# Patient Record
Sex: Male | Born: 1950
Health system: Southern US, Community
[De-identification: ages and names within clinical notes are randomized; demographics above are authoritative.]

## PROBLEM LIST (undated history)

## (undated) DIAGNOSIS — I10 Essential (primary) hypertension: Secondary | ICD-10-CM

## (undated) DIAGNOSIS — E039 Hypothyroidism, unspecified: Secondary | ICD-10-CM

## (undated) DIAGNOSIS — E785 Hyperlipidemia, unspecified: Secondary | ICD-10-CM

## (undated) DIAGNOSIS — Z87891 Personal history of nicotine dependence: Secondary | ICD-10-CM

## (undated) DIAGNOSIS — N200 Calculus of kidney: Secondary | ICD-10-CM

## (undated) DIAGNOSIS — T8859XA Other complications of anesthesia, initial encounter: Secondary | ICD-10-CM

## (undated) DIAGNOSIS — R7303 Prediabetes: Secondary | ICD-10-CM

## (undated) DIAGNOSIS — Z8489 Family history of other specified conditions: Secondary | ICD-10-CM

## (undated) DIAGNOSIS — T4145XA Adverse effect of unspecified anesthetic, initial encounter: Secondary | ICD-10-CM

## (undated) DIAGNOSIS — K219 Gastro-esophageal reflux disease without esophagitis: Secondary | ICD-10-CM

## (undated) DIAGNOSIS — G4730 Sleep apnea, unspecified: Secondary | ICD-10-CM

## (undated) DIAGNOSIS — R011 Cardiac murmur, unspecified: Secondary | ICD-10-CM

## (undated) DIAGNOSIS — M1990 Unspecified osteoarthritis, unspecified site: Secondary | ICD-10-CM

## (undated) HISTORY — PX: JOINT REPLACEMENT: SHX530

## (undated) HISTORY — DX: Personal history of nicotine dependence: Z87.891

## (undated) HISTORY — DX: Hyperlipidemia, unspecified: E78.5

## (undated) HISTORY — PX: HERNIA REPAIR: SHX51

## (undated) HISTORY — DX: Essential (primary) hypertension: I10

## (undated) HISTORY — DX: Prediabetes: R73.03

## (undated) HISTORY — DX: Morbid (severe) obesity due to excess calories: E66.01

## (undated) HISTORY — DX: Hypothyroidism, unspecified: E03.9

## (undated) HISTORY — DX: Cardiac murmur, unspecified: R01.1

---

## 1971-11-29 HISTORY — PX: TOTAL KNEE ARTHROPLASTY: SHX125

## 2014-06-09 DIAGNOSIS — E663 Overweight: Secondary | ICD-10-CM | POA: Diagnosis not present

## 2014-06-09 DIAGNOSIS — Z125 Encounter for screening for malignant neoplasm of prostate: Secondary | ICD-10-CM | POA: Diagnosis not present

## 2014-06-09 DIAGNOSIS — I10 Essential (primary) hypertension: Secondary | ICD-10-CM | POA: Diagnosis not present

## 2014-06-18 DIAGNOSIS — I10 Essential (primary) hypertension: Secondary | ICD-10-CM | POA: Diagnosis not present

## 2014-06-18 DIAGNOSIS — E039 Hypothyroidism, unspecified: Secondary | ICD-10-CM | POA: Diagnosis not present

## 2014-06-18 DIAGNOSIS — E7800 Pure hypercholesterolemia, unspecified: Secondary | ICD-10-CM | POA: Diagnosis not present

## 2014-07-07 DIAGNOSIS — H60509 Unspecified acute noninfective otitis externa, unspecified ear: Secondary | ICD-10-CM | POA: Diagnosis not present

## 2014-07-07 DIAGNOSIS — H6120 Impacted cerumen, unspecified ear: Secondary | ICD-10-CM | POA: Diagnosis not present

## 2014-07-08 DIAGNOSIS — E039 Hypothyroidism, unspecified: Secondary | ICD-10-CM | POA: Diagnosis not present

## 2014-07-08 DIAGNOSIS — E781 Pure hyperglyceridemia: Secondary | ICD-10-CM | POA: Diagnosis not present

## 2014-08-21 DIAGNOSIS — E781 Pure hyperglyceridemia: Secondary | ICD-10-CM | POA: Diagnosis not present

## 2014-08-21 DIAGNOSIS — E039 Hypothyroidism, unspecified: Secondary | ICD-10-CM | POA: Diagnosis not present

## 2014-08-21 DIAGNOSIS — I10 Essential (primary) hypertension: Secondary | ICD-10-CM | POA: Diagnosis not present

## 2014-09-02 DIAGNOSIS — E039 Hypothyroidism, unspecified: Secondary | ICD-10-CM | POA: Diagnosis not present

## 2014-09-02 DIAGNOSIS — I10 Essential (primary) hypertension: Secondary | ICD-10-CM | POA: Diagnosis not present

## 2014-09-02 DIAGNOSIS — E781 Pure hyperglyceridemia: Secondary | ICD-10-CM | POA: Diagnosis not present

## 2014-11-04 ENCOUNTER — Ambulatory Visit: Payer: Self-pay

## 2014-11-04 DIAGNOSIS — R05 Cough: Secondary | ICD-10-CM | POA: Diagnosis not present

## 2014-11-04 DIAGNOSIS — J40 Bronchitis, not specified as acute or chronic: Secondary | ICD-10-CM | POA: Diagnosis not present

## 2014-11-04 DIAGNOSIS — J45909 Unspecified asthma, uncomplicated: Secondary | ICD-10-CM | POA: Diagnosis not present

## 2014-11-04 DIAGNOSIS — J441 Chronic obstructive pulmonary disease with (acute) exacerbation: Secondary | ICD-10-CM | POA: Diagnosis not present

## 2014-11-04 DIAGNOSIS — J9811 Atelectasis: Secondary | ICD-10-CM | POA: Diagnosis not present

## 2015-01-22 ENCOUNTER — Ambulatory Visit: Payer: Self-pay | Admitting: Family Medicine

## 2015-02-04 DIAGNOSIS — R0789 Other chest pain: Secondary | ICD-10-CM | POA: Insufficient documentation

## 2015-05-25 ENCOUNTER — Other Ambulatory Visit: Payer: Self-pay | Admitting: Family Medicine

## 2015-05-25 NOTE — Telephone Encounter (Signed)
Routing to provider  

## 2015-05-25 NOTE — Telephone Encounter (Signed)
Pt called stated he needs a refill on Levothyroxine. 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 Thanks.

## 2015-05-26 MED ORDER — LEVOTHYROXINE SODIUM 75 MCG PO TABS: 75.0000 ug | ORAL_TABLET | Freq: Every day | ORAL | Status: DC

## 2015-06-12 DIAGNOSIS — M170 Bilateral primary osteoarthritis of knee: Secondary | ICD-10-CM | POA: Insufficient documentation

## 2015-06-19 ENCOUNTER — Encounter: Payer: Self-pay | Admitting: Family Medicine

## 2015-06-24 DIAGNOSIS — E039 Hypothyroidism, unspecified: Secondary | ICD-10-CM | POA: Insufficient documentation

## 2015-06-24 DIAGNOSIS — I10 Essential (primary) hypertension: Secondary | ICD-10-CM | POA: Insufficient documentation

## 2015-06-24 DIAGNOSIS — E785 Hyperlipidemia, unspecified: Secondary | ICD-10-CM | POA: Insufficient documentation

## 2015-06-26 ENCOUNTER — Encounter: Payer: Self-pay | Admitting: Family Medicine

## 2015-06-26 ENCOUNTER — Ambulatory Visit (INDEPENDENT_AMBULATORY_CARE_PROVIDER_SITE_OTHER): Payer: Medicare PPO | Admitting: Family Medicine

## 2015-06-26 VITALS — BP 125/83 | HR 67 | Temp 97.5°F | Wt 309.0 lb

## 2015-06-26 DIAGNOSIS — Z23 Encounter for immunization: Secondary | ICD-10-CM | POA: Diagnosis not present

## 2015-06-26 DIAGNOSIS — E039 Hypothyroidism, unspecified: Secondary | ICD-10-CM

## 2015-06-26 DIAGNOSIS — G479 Sleep disorder, unspecified: Secondary | ICD-10-CM

## 2015-06-26 DIAGNOSIS — E785 Hyperlipidemia, unspecified: Secondary | ICD-10-CM | POA: Diagnosis not present

## 2015-06-26 DIAGNOSIS — I10 Essential (primary) hypertension: Secondary | ICD-10-CM | POA: Diagnosis not present

## 2015-06-26 DIAGNOSIS — Z87891 Personal history of nicotine dependence: Secondary | ICD-10-CM

## 2015-06-26 DIAGNOSIS — G4720 Circadian rhythm sleep disorder, unspecified type: Secondary | ICD-10-CM | POA: Insufficient documentation

## 2015-06-26 DIAGNOSIS — Z720 Tobacco use: Secondary | ICD-10-CM | POA: Diagnosis not present

## 2015-06-26 HISTORY — DX: Personal history of nicotine dependence: Z87.891

## 2015-06-26 NOTE — Assessment & Plan Note (Signed)
Check TSH today and adjust if needed 

## 2015-06-26 NOTE — Progress Notes (Signed)
BP 125/83 mmHg  Pulse 67  Temp(Src) 97.5 F (36.4 C)  Wt 309 lb (140.161 kg)  SpO2 97%   Subjective:    Patient ID: Derrick Mckenzie, male    DOB: [birth year], 64 y.o.   MRN: [id number]  HPI: Derrick Mckenzie is a 64 y.o. male  Chief Complaint  Patient presents with  . Hypertension  . Hyperlipidemia  . Hypothyroidism  . IFG   He doesn't sleep well; he does not do anything; he can go on the computer and waste 4-5 hours on the computer Drinks coffee but not within 10 hours of his bedtime; this has gone on for years not sleeping well; he does not snore; wake up during the night; not jerking limbs; first trip to bed, on electronic device in bed; has to urinate   Hypertension; well-controlled; pleased with new lower dose, no dizziness  Hypothyroidism; last TSH was just over 5; energy not great; trouble losing weight  High cholesterol, on fish oil but really constipated; he will switch to krill oil; limiting satured fats; he says he was doing pretty good; was more active, playing golf once a week; under 306 pounds for a while; weighed after golf and got 304 pounds; he'd like to play golf more; he wants to lose weight; high-fiber oatmeal  IFG; avoids sugar and sugary drinks; Crystal Light, water; does have some sugar in his coffee 1 to 1.5 teaspoons sugar  Tobacco abuse; he is not ready to stop; his wife smokes too; patient actually quit for 22 years but then started back  Relevant past medical, surgical, family and social history reviewed and updated as indicated. Interim medical history since our last visit reviewed. Allergies and medications reviewed and updated.  Review of Systems  Per HPI unless specifically indicated above     Objective:    BP 125/83 mmHg  Pulse 67  Temp(Src) 97.5 F (36.4 C)  Wt 309 lb (140.161 kg)  SpO2 97%  Wt Readings from Last 3 Encounters:  06/26/15 309 lb (140.161 kg)  03/23/15 308 lb (139.708 kg)    Physical Exam  Constitutional: He  appears well-developed and well-nourished. No distress.  Morbidly obese  HENT:  Head: Normocephalic and atraumatic.  Eyes: EOM are normal. No scleral icterus.  Neck: No thyromegaly present.  Circumference 18-1/4 inches  Cardiovascular: Normal rate and regular rhythm.   No murmur heard. Pulmonary/Chest: Effort normal and breath sounds normal. No respiratory distress. He has no wheezes.  Abdominal: Soft. Bowel sounds are normal. He exhibits no distension.  Musculoskeletal: He exhibits no edema.  Neurological: Coordination normal.  Skin: Skin is warm and dry. No pallor.  Psychiatric: He has a normal mood and affect. His behavior is normal. Judgment and thought content normal.      Assessment & Plan:   Problem List Items Addressed This Visit      Cardiovascular and Mediastinum   Hypertension    Well-controlled; no dizziness; continue current regimen; getting sleep study to see if part of the problem      Relevant Orders   Pneumococcal polysaccharide vaccine 23-valent greater than or equal to 2yo subcutaneous/IM     Endocrine   Hypothyroidism - Primary    Check TSH today and adjust if needed      Relevant Orders   TSH     Other   Hyperlipidemia    Check lipids today; kriill oil      Relevant Orders   Lipid Panel w/o Chol/HDL Ratio   Morbid  obesity    Check to see if sleep apnea; check TSH; encourage avoidance of empty calories      Relevant Orders   Pneumococcal polysaccharide vaccine 23-valent greater than or equal to 2yo subcutaneous/IM   Sleep pattern disturbance    Neck circumference 18-1/4"; suspect OSA with nocturia and will get sleep study; no electronics two hours before bed; no caffeine 6-10 hours before bed      Relevant Orders   Nocturnal polysomnography (NPSG)   Tobacco abuse    He has quit smoking in the past; 10-15 cigs a day; encouraged cessation;       Relevant Orders   Pneumococcal polysaccharide vaccine 23-valent greater than or equal to 2yo  subcutaneous/IM   Need for prophylactic vaccination against Streptococcus pneumoniae (pneumococcus)    this was recommended to the patient; PCV-13 given today; no more boosters needed; PPSV-23 due in 6-12 months         Follow up plan: Return in about 4 months (around 10/27/2015) for multiple issues.   Orders Placed This Encounter  Procedures  . Pneumococcal polysaccharide vaccine 23-valent greater than or equal to 2yo subcutaneous/IM  . Lipid Panel w/o Chol/HDL Ratio  . TSH  . Nocturnal polysomnography (NPSG)

## 2015-06-26 NOTE — Assessment & Plan Note (Signed)
He has quit smoking in the past; 10-15 cigs a day; encouraged cessation;

## 2015-06-26 NOTE — Patient Instructions (Addendum)
We'll schedule you for a sleep study We'll contact you about the lab results and adjust your thyroid medicine if needed Do try to drink 64 ounces of water a day Limit evening snacking Avoid any and all electronics for two hours before bedtime Return in 4 months but sooner if needed You receive a pneumonia vaccine today called Prevnar, PCV-13; you will not need another booster for the rest of your life for this particular vaccine You can receive the other pneumonia vaccine called Pneumovax next year (PPSV-23)  Smoking Cessation, Tips for Success If you are ready to quit smoking, congratulations! You have chosen to help yourself be healthier. Cigarettes bring nicotine, tar, carbon monoxide, and other irritants into your body. Your lungs, heart, and blood vessels will be able to work better without these poisons. There are many different ways to quit smoking. Nicotine gum, nicotine patches, a nicotine inhaler, or nicotine nasal spray can help with physical craving. Hypnosis, support groups, and medicines help break the habit of smoking. WHAT THINGS CAN I DO TO MAKE QUITTING EASIER?  Here are some tips to help you quit for good:  Pick a date when you will quit smoking completely. Tell all of your friends and family about your plan to quit on that date.  Do not try to slowly cut down on the number of cigarettes you are smoking. Pick a quit date and quit smoking completely starting on that day.  Throw away all cigarettes.   Clean and remove all ashtrays from your home, work, and car.  On a card, write down your reasons for quitting. Carry the card with you and read it when you get the urge to smoke.  Cleanse your body of nicotine. Drink enough water and fluids to keep your urine clear or pale yellow. Do this after quitting to flush the nicotine from your body.  Learn to predict your moods. Do not let a bad situation be your excuse to have a cigarette. Some situations in your life might tempt you  into wanting a cigarette.  Never have "just one" cigarette. It leads to wanting another and another. Remind yourself of your decision to quit.  Change habits associated with smoking. If you smoked while driving or when feeling stressed, try other activities to replace smoking. Stand up when drinking your coffee. Brush your teeth after eating. Sit in a different chair when you read the paper. Avoid alcohol while trying to quit, and try to drink fewer caffeinated beverages. Alcohol and caffeine may urge you to smoke.  Avoid foods and drinks that can trigger a desire to smoke, such as sugary or spicy foods and alcohol.  Ask people who smoke not to smoke around you.  Have something planned to do right after eating or having a cup of coffee. For example, plan to take a walk or exercise.  Try a relaxation exercise to calm you down and decrease your stress. Remember, you may be tense and nervous for the first 2 weeks after you quit, but this will pass.  Find new activities to keep your hands busy. Play with a pen, coin, or rubber band. Doodle or draw things on paper.  Brush your teeth right after eating. This will help cut down on the craving for the taste of tobacco after meals. You can also try mouthwash.   Use oral substitutes in place of cigarettes. Try using lemon drops, carrots, cinnamon sticks, or chewing gum. Keep them handy so they are available when you have the  urge to smoke.  When you have the urge to smoke, try deep breathing.  Designate your home as a nonsmoking area.  If you are a heavy smoker, ask your health care provider about a prescription for nicotine chewing gum. It can ease your withdrawal from nicotine.  Reward yourself. Set aside the cigarette money you save and buy yourself something nice.  Look for support from others. Join a support group or smoking cessation program. Ask someone at home or at work to help you with your plan to quit smoking.  Always ask yourself,  "Do I need this cigarette or is this just a reflex?" Tell yourself, "Today, I choose not to smoke," or "I do not want to smoke." You are reminding yourself of your decision to quit.  Do not replace cigarette smoking with electronic cigarettes (commonly called e-cigarettes). The safety of e-cigarettes is unknown, and some may contain harmful chemicals.  If you relapse, do not give up! Plan ahead and think about what you will do the next time you get the urge to smoke. HOW WILL I FEEL WHEN I QUIT SMOKING? You may have symptoms of withdrawal because your body is used to nicotine (the addictive substance in cigarettes). You may crave cigarettes, be irritable, feel very hungry, cough often, get headaches, or have difficulty concentrating. The withdrawal symptoms are only temporary. They are strongest when you first quit but will go away within 10-14 days. When withdrawal symptoms occur, stay in control. Think about your reasons for quitting. Remind yourself that these are signs that your body is healing and getting used to being without cigarettes. Remember that withdrawal symptoms are easier to treat than the major diseases that smoking can cause.  Even after the withdrawal is over, expect periodic urges to smoke. However, these cravings are generally short lived and will go away whether you smoke or not. Do not smoke! WHAT RESOURCES ARE AVAILABLE TO HELP ME QUIT SMOKING? Your health care provider can direct you to community resources or hospitals for support, which may include:  Group support.  Education.  Hypnosis.  Therapy. Document Released: 08/12/2004 Document Revised: 03/31/2014 Document Reviewed: 05/02/2013 [facility] Patient Information 2015 [place], [place]. This information is not intended to replace advice given to you by your health care provider. Make sure you discuss any questions you have with your health care provider.

## 2015-06-26 NOTE — Assessment & Plan Note (Addendum)
Neck circumference 18-1/4"; suspect OSA with nocturia and will get sleep study; no electronics two hours before bed; no caffeine 6-10 hours before bed

## 2015-06-26 NOTE — Assessment & Plan Note (Signed)
Check lipids today; kriill oil

## 2015-06-26 NOTE — Assessment & Plan Note (Signed)
this was recommended to the patient; PCV-13 given today; no more boosters needed; PPSV-23 due in 6-12 months

## 2015-06-26 NOTE — Assessment & Plan Note (Signed)
Well-controlled; no dizziness; continue current regimen; getting sleep study to see if part of the problem

## 2015-06-26 NOTE — Assessment & Plan Note (Signed)
Check to see if sleep apnea; check TSH; encourage avoidance of empty calories

## 2015-06-27 LAB — LIPID PANEL W/O CHOL/HDL RATIO
Cholesterol, Total: 118 mg/dL (ref 100–199)
HDL: 30 mg/dL — ABNORMAL LOW (ref >39)
LDL Calculated: 51 mg/dL (ref 0–99)
Triglycerides: 185 mg/dL — ABNORMAL HIGH (ref 0–149)
VLDL Cholesterol Cal: 37 mg/dL (ref 5–40)

## 2015-06-27 LAB — TSH: TSH: 4.94 u[IU]/mL — ABNORMAL HIGH (ref 0.450–4.500)

## 2015-06-28 ENCOUNTER — Encounter: Payer: Self-pay | Admitting: Family Medicine

## 2015-06-28 ENCOUNTER — Other Ambulatory Visit: Payer: Self-pay | Admitting: Family Medicine

## 2015-06-28 DIAGNOSIS — E039 Hypothyroidism, unspecified: Secondary | ICD-10-CM

## 2015-06-28 MED ORDER — LEVOTHYROXINE SODIUM 88 MCG PO TABS: 88.0000 ug | ORAL_TABLET | Freq: Every day | ORAL | Status: DC

## 2015-06-28 NOTE — Assessment & Plan Note (Signed)
Increase dose, recheck TSH 8 weeks after adjustment

## 2015-08-28 ENCOUNTER — Other Ambulatory Visit: Payer: Self-pay | Admitting: Family Medicine

## 2015-08-28 NOTE — Telephone Encounter (Signed)
Routing to provider  

## 2015-08-30 NOTE — Telephone Encounter (Signed)
I will ask patient to have TSH rechecked; sending Rx as requested I sent note through [facility]

## 2015-08-31 ENCOUNTER — Ambulatory Visit (INDEPENDENT_AMBULATORY_CARE_PROVIDER_SITE_OTHER): Payer: Medicare PPO | Admitting: Family Medicine

## 2015-08-31 ENCOUNTER — Encounter: Payer: Self-pay | Admitting: Family Medicine

## 2015-08-31 VITALS — BP 123/71 | HR 69 | Temp 97.6°F | Ht 66.7 in | Wt 313.0 lb

## 2015-08-31 DIAGNOSIS — E785 Hyperlipidemia, unspecified: Secondary | ICD-10-CM

## 2015-08-31 DIAGNOSIS — E039 Hypothyroidism, unspecified: Secondary | ICD-10-CM

## 2015-08-31 DIAGNOSIS — Z720 Tobacco use: Secondary | ICD-10-CM

## 2015-08-31 MED ORDER — ATORVASTATIN CALCIUM 20 MG PO TABS: 20.0000 mg | ORAL_TABLET | Freq: Every day | ORAL | Status: DC

## 2015-08-31 MED ORDER — LEVOTHYROXINE SODIUM 100 MCG PO TABS: 100.0000 ug | ORAL_TABLET | Freq: Every day | ORAL | Status: DC

## 2015-08-31 NOTE — Assessment & Plan Note (Signed)
Encouraged him to consider get into [employer]; I'll see if my staff can get additional information about programs for seniors, and I encouraged the patient to call on his own

## 2015-08-31 NOTE — Progress Notes (Signed)
BP 123/71 mmHg  Pulse 69  Temp(Src) 97.6 F (36.4 C)  Ht 5' 6.7" (1.694 m)  Wt 313 lb (141.976 kg)  BMI 49.48 kg/m2  SpO2 96%   Subjective:    Patient ID: Derrick Mckenzie, male    DOB: Jun 27, 1951, 64 y.o.   MRN: [id number]  HPI: Derrick Mckenzie is a 64 y.o. male  Chief Complaint  Patient presents with  . Hypertension  . Hyperlipidemia  . Hypothyroidism   No interim history since last visit  Obesity; just sits around and does nothing; doesn't know what to do; just bored being retired, but does not want to get a job; plays golf once a week; no exercise equipment at home; could go to [employer]; he is thinking about taking some classes; his peak weight has been 351 pounds; he did Atkins diet, able to avoid bread and pasta and potatoes; his current wife cooks for an [employer]; he tried [employer] but it didn't work well; does drink water (bottled or filter)  Hypothyroidism; due for labs to see if correct dose; however, he says that he was taking 88 mcg every day previously when the last lab was checked; I thought he had run out and had not been taking religiously, so I thought today was a recheck TSH on the 88 mcg dose; he reports no palpitations, no chest pain; no diarrhea, no jitteriness  High cholesterol; last lipids were in April; he has been having foot pains with the 40 mg dose of statin, so he has decreased the dose on his own  Relevant past medical, surgical, family and social history reviewed and updated as indicated. Interim medical history since our last visit reviewed. Allergies and medications reviewed and updated.  Review of Systems  Per HPI unless specifically indicated above     Objective:    BP 123/71 mmHg  Pulse 69  Temp(Src) 97.6 F (36.4 C)  Ht 5' 6.7" (1.694 m)  Wt 313 lb (141.976 kg)  BMI 49.48 kg/m2  SpO2 96%  Wt Readings from Last 3 Encounters:  08/31/15 313 lb (141.976 kg)  06/26/15 309 lb (140.161 kg)  03/23/15 308 lb (139.708 kg)     Physical Exam  Constitutional: He appears well-developed and well-nourished.  Weight gain five pounds  Eyes: EOM are normal.  Cardiovascular: Normal rate and regular rhythm.   Pulmonary/Chest: Effort normal and breath sounds normal.  Abdominal: Soft. There is no tenderness.  Obese  Psychiatric: He has a normal mood and affect. His speech is normal and behavior is normal. Thought content normal.   Results for orders placed or performed in visit on 06/26/15  Lipid Panel w/o Chol/HDL Ratio  Result Value Ref Range   Cholesterol, Total 118 100 - 199 mg/dL   Triglycerides 185 (H) 0 - 149 mg/dL   HDL 30 (L) >39 mg/dL   VLDL Cholesterol Cal 37 5 - 40 mg/dL   LDL Calculated 51 0 - 99 mg/dL  TSH  Result Value Ref Range   TSH 4.940 (H) 0.450 - 4.500 uIU/mL      Assessment & Plan:   Problem List Items Addressed This Visit      Endocrine   Hypothyroidism - Primary    Increase dose from 88 to 100 mcg daily; check TSH in 8 weeks      Relevant Medications   levothyroxine (SYNTHROID, LEVOTHROID) 100 MCG tablet     Other   Hyperlipidemia    Decrease statin from 40 mg to 20 mg;  check lipids and sgpt in 8 weeks      Relevant Medications   atorvastatin (LIPITOR) 20 MG tablet   Morbid obesity ([place])    Encouraged him to consider get into Silver [employer]; I'll see if my staff can get additional information about programs for seniors, and I encouraged the patient to call on his own      Tobacco abuse    He is not ready to quit smoking right now          Follow up plan: Return in about 8 weeks (around 10/27/2015).  An after-visit summary was printed and given to the patient at [facility].  Please see the patient instructions which may contain other information and recommendations beyond what is mentioned above in the assessment and plan.  Meds ordered this encounter  Medications  . levothyroxine (SYNTHROID, LEVOTHROID) 100 MCG tablet    Sig: Take 1 tablet (100 mcg total) by mouth  daily.    Dispense:  30 tablet    Refill:  1    Pharmacist -- increasing dose  . atorvastatin (LIPITOR) 20 MG tablet    Sig: Take 1 tablet (20 mg total) by mouth daily.    Dispense:  90 tablet    Refill:  1    Pharmacist -- changing dose from 40 mg to 20 mg

## 2015-08-31 NOTE — Assessment & Plan Note (Signed)
He is not ready to quit smoking right now

## 2015-08-31 NOTE — Assessment & Plan Note (Signed)
Decrease statin from 40 mg to 20 mg; check lipids and sgpt in 8 weeks

## 2015-08-31 NOTE — Assessment & Plan Note (Signed)
Increase dose from 88 to 100 mcg daily; check TSH in 8 weeks

## 2015-08-31 NOTE — Patient Instructions (Addendum)
I am here to help you quit smoking if and when you are ready Decrease the atorvastatin from 40 mg to 20 mg every night Increase the thyroid medicine from 88 to 100 mcg daily Let's recheck your labs in 8 weeks (lab visit only) Limit saturated fats Consider getting back involved in [employer], and check with your insurance to see if any other programs are available

## 2015-10-27 ENCOUNTER — Ambulatory Visit: Payer: Medicare PPO | Admitting: Family Medicine

## 2015-10-29 ENCOUNTER — Other Ambulatory Visit: Payer: Self-pay | Admitting: Family Medicine

## 2015-10-29 DIAGNOSIS — E039 Hypothyroidism, unspecified: Secondary | ICD-10-CM

## 2015-10-29 DIAGNOSIS — E785 Hyperlipidemia, unspecified: Secondary | ICD-10-CM

## 2015-10-29 DIAGNOSIS — Z5181 Encounter for therapeutic drug level monitoring: Secondary | ICD-10-CM

## 2015-10-29 NOTE — Telephone Encounter (Signed)
Routing to provider  

## 2015-10-30 DIAGNOSIS — Z5181 Encounter for therapeutic drug level monitoring: Secondary | ICD-10-CM | POA: Insufficient documentation

## 2015-10-30 NOTE — Telephone Encounter (Signed)
I spoke with patient, they are on their way out of state for a death in the family. He will come in for labs after they get back.

## 2015-10-30 NOTE — Telephone Encounter (Signed)
Patient was supposed to be seen 8 weeks after last visit; he cancelled his appt Please ask patient to have labs done at his earliest convenience I am happy to see him; if he doesn't think he needs to see me, then please just come in for labs Orders entered I'll approve a limited amount of Rx but I don't want him to have a whole month of the wrong dose if we end up adjusting it

## 2015-10-30 NOTE — Assessment & Plan Note (Signed)
Check lipids 

## 2015-10-30 NOTE — Assessment & Plan Note (Signed)
Check TSH; was due 8 weeks after last visit (now overdue)

## 2015-11-04 ENCOUNTER — Telehealth: Payer: Self-pay | Admitting: Family Medicine

## 2015-11-04 NOTE — Telephone Encounter (Signed)
-----   Message from Redgie Grayer sent at 11/04/2015  4:33 PM EST ----- Regarding: RE: Seniors classes, exercise programs, Silver Sneakers Please find the address and phone number below regarding Silver Sneakers.  [facility].  [place], [place] [address]   Phone: [phone]     ----- Message -----    From: Arnetha Courser, MD    Sent: 08/31/2015   8:26 AM      To: Redgie Grayer Subject: Seniors classes, exercise programs, Silver S#  Can you direct me to the right person to ask about silver sneakers and other programs through [facility]? Thanks!!

## 2015-11-06 NOTE — Telephone Encounter (Signed)
Left detailed message with contact info on patients identified voicemail.

## 2015-11-29 HISTORY — PX: REPLACEMENT TOTAL KNEE: SUR1224

## 2015-12-02 ENCOUNTER — Encounter: Payer: Self-pay | Admitting: Family Medicine

## 2015-12-02 MED ORDER — LEVOTHYROXINE SODIUM 50 MCG PO TABS: ORAL_TABLET | ORAL | Status: DC

## 2015-12-02 NOTE — Telephone Encounter (Signed)
Sending in new lower dose of thyroid med; he's been over for a month or so, don't want to jump start him right back to 100 mcg; will start at 50, then 75, then check labs in a few months; offered another statin versus non-statin

## 2015-12-19 ENCOUNTER — Encounter: Payer: Self-pay | Admitting: Family Medicine

## 2015-12-21 ENCOUNTER — Other Ambulatory Visit: Payer: Self-pay

## 2015-12-21 MED ORDER — HYDROCHLOROTHIAZIDE 25 MG PO TABS: 12.5000 mg | ORAL_TABLET | Freq: Every day | ORAL | Status: DC

## 2015-12-21 MED ORDER — AMLODIPINE BESYLATE 10 MG PO TABS: 10.0000 mg | ORAL_TABLET | Freq: Every day | ORAL | Status: DC

## 2015-12-21 MED ORDER — LEVOTHYROXINE SODIUM 50 MCG PO TABS: ORAL_TABLET | ORAL | Status: DC

## 2015-12-21 NOTE — Telephone Encounter (Signed)
Spoke with patient regarding his inability to obtain medication refills through [facility].  Advised patient that I would contact [employer] @ [phone] to find out where the request is being sent and how to rectify this issue going forward.

## 2015-12-21 NOTE — Telephone Encounter (Signed)
Dr. Sanda Klein, it seems as if Derrick Mckenzie has gotten to the root of his medication refill issues below. Could you refill his 3 meds for about 2 weeks to cover until his mail order comes in? Thanks!

## 2015-12-21 NOTE — Telephone Encounter (Signed)
Two week supplies of meds approved to local pharmacy

## 2015-12-21 NOTE — Telephone Encounter (Signed)
LMOM for Derrick Mckenzie advising him that the delay in receiving his RX refills was a [employer] issue and not caused by [employer]. [employer] representative states that the refill request was not sent in properly via their online portal. Rep states that there are issues with their website from time to time and that patients will have a better outcome if they call their order in @ [phone]. Pt's refills will arrive within 7-10 business days. [employer] pharmacist suggested that a two-week supply be called in to a local pharmacy until patient receives their mail order.

## 2016-02-02 ENCOUNTER — Telehealth: Payer: Self-pay | Admitting: Family Medicine

## 2016-02-02 DIAGNOSIS — G4720 Circadian rhythm sleep disorder, unspecified type: Secondary | ICD-10-CM

## 2016-02-02 NOTE — Assessment & Plan Note (Signed)
New order.

## 2016-02-02 NOTE — Telephone Encounter (Signed)
Wife said they were never contacted about sleep study Entered in July 2016; still in chart I will put in referral to neuro high priority

## 2016-02-08 DIAGNOSIS — H5210 Myopia, unspecified eye: Secondary | ICD-10-CM | POA: Diagnosis not present

## 2016-02-08 DIAGNOSIS — H524 Presbyopia: Secondary | ICD-10-CM | POA: Diagnosis not present

## 2016-02-18 ENCOUNTER — Encounter: Payer: Self-pay | Admitting: Family Medicine

## 2016-03-28 ENCOUNTER — Encounter: Payer: Self-pay | Admitting: Family Medicine

## 2016-04-04 ENCOUNTER — Ambulatory Visit: Payer: Medicare PPO | Admitting: Family Medicine

## 2016-04-12 ENCOUNTER — Ambulatory Visit
Admission: RE | Admit: 2016-04-12 | Discharge: 2016-04-12 | Disposition: A | Discharge status: Home / Self Care | Payer: Commercial Managed Care - HMO | Source: Ambulatory Visit | Attending: Family Medicine | Admitting: Family Medicine

## 2016-04-12 ENCOUNTER — Encounter: Payer: Self-pay | Admitting: Family Medicine

## 2016-04-12 ENCOUNTER — Ambulatory Visit (INDEPENDENT_AMBULATORY_CARE_PROVIDER_SITE_OTHER): Payer: Medicare PPO | Admitting: Family Medicine

## 2016-04-12 ENCOUNTER — Telehealth: Payer: Self-pay | Admitting: Family Medicine

## 2016-04-12 VITALS — BP 122/80 | HR 89 | Temp 98.0°F | Resp 16 | Wt 313.0 lb

## 2016-04-12 DIAGNOSIS — Z720 Tobacco use: Secondary | ICD-10-CM | POA: Diagnosis not present

## 2016-04-12 DIAGNOSIS — M179 Osteoarthritis of knee, unspecified: Secondary | ICD-10-CM | POA: Diagnosis not present

## 2016-04-12 DIAGNOSIS — I10 Essential (primary) hypertension: Secondary | ICD-10-CM | POA: Diagnosis not present

## 2016-04-12 DIAGNOSIS — E039 Hypothyroidism, unspecified: Secondary | ICD-10-CM | POA: Diagnosis not present

## 2016-04-12 DIAGNOSIS — M25561 Pain in right knee: Secondary | ICD-10-CM | POA: Diagnosis not present

## 2016-04-12 DIAGNOSIS — M25461 Effusion, right knee: Secondary | ICD-10-CM | POA: Diagnosis not present

## 2016-04-12 DIAGNOSIS — E785 Hyperlipidemia, unspecified: Secondary | ICD-10-CM | POA: Diagnosis not present

## 2016-04-12 DIAGNOSIS — M1711 Unilateral primary osteoarthritis, right knee: Secondary | ICD-10-CM | POA: Diagnosis not present

## 2016-04-12 DIAGNOSIS — Z87891 Personal history of nicotine dependence: Secondary | ICD-10-CM

## 2016-04-12 DIAGNOSIS — M2341 Loose body in knee, right knee: Secondary | ICD-10-CM | POA: Diagnosis not present

## 2016-04-12 DIAGNOSIS — Z5181 Encounter for therapeutic drug level monitoring: Secondary | ICD-10-CM | POA: Diagnosis not present

## 2016-04-12 NOTE — Telephone Encounter (Signed)
Pt said that someone called him at 3:03 and said for you all to call him back

## 2016-04-12 NOTE — Assessment & Plan Note (Addendum)
No success with dieting; patient is open to seeing bariatric surgeon for consultation; referral placed

## 2016-04-12 NOTE — Progress Notes (Signed)
BP 122/80 mmHg  Pulse 89  Temp(Src) 98 F (36.7 C) (Oral)  Resp 16  Wt 313 lb (141.976 kg)  SpO2 96%   Subjective:    Patient ID: Derrick Mckenzie, male    DOB: 12-01-1950, 65 y.o.   MRN: [id number]  HPI: Derrick Mckenzie is a 65 y.o. male  Chief Complaint  Patient presents with  . Follow-up  . Hyperlipidemia    stopped med due to side effects  . Hypothyroidism    stopped med due to side effects  . Knee Pain    right onset 4 weeks   He has been away from medical care for a little while, family and travel have kept him away No medical excitement since last visit QUIT smoking, March 8th quit cold [place]; quit for 22 years once then picked back up  Quit taking his own thyroid medicine; he started to have side effects, he read everything and decided to quit on his own; still bloated and over 300 pounds; nothing changed in terms of his weight He quit taking the cholesterol; he had his heart checked and had carotid checked; he does eat processed meat, but rarely; wife tires to cook healthy, limits starches too; does snack, likes cheese  Right knee pain; going on for 4 weeks; hurt it first 8-9 years ago jumping off of a wall at an underground parking garage, destroyed my leg; went to see ortho; dxd with arthritis; never had a problem before the jump; did cortisone shot, did nothing; never rebounded; then recently, was in [place]; got up from chair awkwardly, pushed and pivoted off the right leg to turn to his left and felt pain; using ibuprofen; no stomach upset or bleeding  Depression screen [facility] 2/9 04/12/2016 06/26/2015  Decreased Interest 0 0  Down, Depressed, Hopeless 0 0  PHQ - 2 Score 0 0   Relevant past medical, surgical, family and social history reviewed Past Medical History  Diagnosis Date  . Hypertension   . Hyperlipidemia   . Morbid obesity ([place])   . Hypothyroidism   . Hx of smoking 06/26/2015   Past Surgical History  Procedure Laterality Date  . Hernia repair    .  Total knee arthroplasty  1973   Family History  Problem Relation Age of Onset  . Cancer Mother     leukemia  . Diabetes Brother    Social History  Substance Use Topics  . Smoking status: Former Smoker -- 0.00 packs/day for 10 years    Types: Cigarettes    Quit date: 02/03/2016  . Smokeless tobacco: Never Used  . Alcohol Use: 0.0 oz/week    0 Standard drinks or equivalent per week     Comment: socially   Interim medical history since last visit reviewed. Allergies and medications reviewed  Review of Systems Per HPI unless specifically indicated above     Objective:    BP 122/80 mmHg  Pulse 89  Temp(Src) 98 F (36.7 C) (Oral)  Resp 16  Wt 313 lb (141.976 kg)  SpO2 96%  Wt Readings from Last 3 Encounters:  04/12/16 313 lb (141.976 kg)  08/31/15 313 lb (141.976 kg)  06/26/15 309 lb (140.161 kg)   body mass index is 49.48 kg/(m^2).  Physical Exam  Constitutional: He appears well-developed and well-nourished.  Weight stable; morbidly obese  Eyes: EOM are normal.  Cardiovascular: Normal rate and regular rhythm.   Heart sounds distant due to body habitus  Pulmonary/Chest: Effort normal and breath sounds normal.  Breath sounds rather distant due to body habitus  Abdominal: Soft. There is no tenderness.  Obese  Musculoskeletal:       Right knee: He exhibits decreased range of motion. He exhibits no deformity and no erythema. Tenderness found. Medial joint line tenderness noted.  Skin: No pallor.  Psychiatric: He has a normal mood and affect. His speech is normal and behavior is normal. Thought content normal.      Assessment & Plan:   Problem List Items Addressed This Visit      Cardiovascular and Mediastinum   Hypertension    Well-controlled today        Endocrine   Hypothyroidism    Check TSH and free T4, this is off of medicine for 6 months, patient quit taking medicines on his own; this likely contributes to his difficulty losing weight      Relevant  Orders   TSH (Completed)   T4, free (Completed)     Other   Hx of smoking    I am ecstatic that patient has quit smoking      Hyperlipidemia - Primary    Limit saturated fats; encouraged more activity, but limited by knee problems; check lipids today      Relevant Orders   Lipid Panel w/o Chol/HDL Ratio (Completed)   Medication monitoring encounter    Check liver, kidneys      Relevant Orders   Comprehensive metabolic panel (Completed)   Morbid obesity ([place])    No success with dieting; patient is open to seeing bariatric surgeon for consultation; referral placed      Relevant Orders   Ambulatory referral to General Surgery   Right medial knee pain    Suspect meniscal injury based on history; will start with plain films, refer to ortho      Relevant Orders   DG Knee Complete 4 Views Right (Completed)      Follow up plan: Return in about 6 months (around 10/13/2016) for follow-up and labs.  An after-visit summary was printed and given to the patient at [place].  Please see the patient instructions which may contain other information and recommendations beyond what is mentioned above in the assessment and plan.  Orders Placed This Encounter  Procedures  . DG Knee Complete 4 Views Right  . Comprehensive metabolic panel  . Lipid Panel w/o Chol/HDL Ratio  . TSH  . T4, free  . Ambulatory referral to General Surgery

## 2016-04-12 NOTE — Assessment & Plan Note (Signed)
Well controlled today.

## 2016-04-12 NOTE — Assessment & Plan Note (Signed)
Check liver, kidneys 

## 2016-04-12 NOTE — Patient Instructions (Addendum)
Try turmeric as a natural anti-inflammatory (for pain and arthritis). It comes in capsules where you buy aspirin and fish oil, but also as a spice where you buy pepper and garlic powder. If you need something for aches or pains, try to use Tylenol (acetaminphen) instead of non-steroidals (which include Aleve, ibuprofen, Advil, Motrin, and naproxen); non-steroidals can cause long-term kidney damage and increase the risk of heart attack and stroke If you do take ibuprofen or naproxen, make sure you take it at least one hour AFTER you have taking your first aspirin of the day Have labs done today here and the xray done across the street If you have not heard anything from my staff in a week about any orders/referrals/studies from today, please contact us here to follow-up [phone]  Check out the information at familydoctor.org: https://familydoctor.org/nutrition-weight-loss-need-know-fad-diets/ Try to lose between 1-2 pounds per week by taking in fewer calories and burning off more calories You can succeed by limiting portions, limiting foods dense in calories and fat, becoming more active, and drinking 8 glasses of water a day (64 ounces) Don't skip meals, especially breakfast, as skipping meals may alter your metabolism Do not use over-the-counter weight loss pills or gimmicks that claim rapid weight loss A healthy BMI (or body mass index) is between 18.5 and 24.9 You can calculate your ideal BMI at the [place] website [employer]Monet[employer]fr

## 2016-04-12 NOTE — Assessment & Plan Note (Addendum)
Check TSH and free T4, this is off of medicine for 6 months, patient quit taking medicines on his own; this likely contributes to his difficulty losing weight

## 2016-04-12 NOTE — Assessment & Plan Note (Signed)
Limit saturated fats; encouraged more activity, but limited by knee problems; check lipids today

## 2016-04-13 ENCOUNTER — Encounter: Payer: Self-pay | Admitting: Family Medicine

## 2016-04-13 ENCOUNTER — Other Ambulatory Visit: Payer: Self-pay | Admitting: Family Medicine

## 2016-04-13 DIAGNOSIS — E781 Pure hyperglyceridemia: Secondary | ICD-10-CM

## 2016-04-13 DIAGNOSIS — E039 Hypothyroidism, unspecified: Secondary | ICD-10-CM

## 2016-04-13 LAB — COMPREHENSIVE METABOLIC PANEL
ALT: 14 IU/L (ref 0–44)
AST: 18 IU/L (ref 0–40)
Albumin/Globulin Ratio: 1.6 (ref 1.2–2.2)
Albumin: 4.6 g/dL (ref 3.6–4.8)
Alkaline Phosphatase: 62 IU/L (ref 39–117)
BUN/Creatinine Ratio: 17 (ref 10–24)
BUN: 21 mg/dL (ref 8–27)
Bilirubin Total: 0.5 mg/dL (ref 0.0–1.2)
CO2: 29 mmol/L (ref 18–29)
Calcium: 9.8 mg/dL (ref 8.6–10.2)
Chloride: 94 mmol/L — ABNORMAL LOW (ref 96–106)
Creatinine, Ser: 1.23 mg/dL (ref 0.76–1.27)
GFR calc Af Amer: 70 mL/min/{1.73_m2} (ref >59)
GFR calc non Af Amer: 60 mL/min/{1.73_m2} (ref >59)
Globulin, Total: 2.9 g/dL (ref 1.5–4.5)
Glucose: 114 mg/dL — ABNORMAL HIGH (ref 65–99)
Potassium: 4.5 mmol/L (ref 3.5–5.2)
Sodium: 140 mmol/L (ref 134–144)
Total Protein: 7.5 g/dL (ref 6.0–8.5)

## 2016-04-13 LAB — T4, FREE: Free T4: 1.02 ng/dL (ref 0.82–1.77)

## 2016-04-13 LAB — LIPID PANEL W/O CHOL/HDL RATIO
Cholesterol, Total: 209 mg/dL — ABNORMAL HIGH (ref 100–199)
HDL: 33 mg/dL — ABNORMAL LOW (ref >39)
LDL Calculated: 101 mg/dL — ABNORMAL HIGH (ref 0–99)
Triglycerides: 376 mg/dL — ABNORMAL HIGH (ref 0–149)
VLDL Cholesterol Cal: 75 mg/dL — ABNORMAL HIGH (ref 5–40)

## 2016-04-13 LAB — TSH: TSH: 7.95 u[IU]/mL — ABNORMAL HIGH (ref 0.450–4.500)

## 2016-04-13 MED ORDER — LEVOTHYROXINE SODIUM 50 MCG PO TABS: ORAL_TABLET | ORAL | Status: DC

## 2016-04-13 NOTE — Assessment & Plan Note (Signed)
Start back on med, titrate up 50 mcg x 1 month, then 75 mcg

## 2016-04-19 ENCOUNTER — Encounter: Payer: Self-pay | Admitting: Family Medicine

## 2016-04-19 DIAGNOSIS — M25561 Pain in right knee: Secondary | ICD-10-CM

## 2016-04-19 MED ORDER — OXYCODONE-ACETAMINOPHEN 5-325 MG PO TABS: 0.5000 | ORAL_TABLET | Freq: Four times a day (QID) | ORAL | Status: DC | PRN

## 2016-04-19 MED ORDER — HYDROCODONE-ACETAMINOPHEN 5-325 MG PO TABS: 1.0000 | ORAL_TABLET | ORAL | Status: DC | PRN

## 2016-04-19 NOTE — Assessment & Plan Note (Signed)
See xrays; refer

## 2016-04-19 NOTE — Addendum Note (Signed)
Addended by: Shiquan Mathieu, Satira Anis on: 04/19/2016 12:58 PM   Modules accepted: Orders, Medications

## 2016-04-28 DIAGNOSIS — M2391 Unspecified internal derangement of right knee: Secondary | ICD-10-CM | POA: Diagnosis not present

## 2016-04-28 DIAGNOSIS — M1711 Unilateral primary osteoarthritis, right knee: Secondary | ICD-10-CM | POA: Diagnosis not present

## 2016-04-29 ENCOUNTER — Encounter: Payer: Self-pay | Admitting: Family Medicine

## 2016-04-29 NOTE — Telephone Encounter (Signed)
Miel, can you please contact the patient and see how we can best resolve this issue? Thank you

## 2016-05-02 ENCOUNTER — Other Ambulatory Visit: Payer: Self-pay | Admitting: Orthopedic Surgery

## 2016-05-02 DIAGNOSIS — M1711 Unilateral primary osteoarthritis, right knee: Secondary | ICD-10-CM

## 2016-05-02 DIAGNOSIS — M2391 Unspecified internal derangement of right knee: Secondary | ICD-10-CM

## 2016-05-03 NOTE — Telephone Encounter (Signed)
I personally called the patient; he says it has been straightened out; he called [facility] because he said that is who he needed to talk to, not really me He wants the knee fixed, not a remedy for just pain; he doesn't want to take more pills He takes ibuprofen PRN, it's holding him for now; really has gotten a lot better; can maneuver, walk He told him he has bone on bone, spurs He is going to schedule him for an MRI later this month Patient wants the next step, wants to fix the knee He'll refer him to Dr. Jefm Bryant or Dr. Roland Rack if needed

## 2016-05-10 ENCOUNTER — Encounter: Payer: Self-pay | Admitting: Family Medicine

## 2016-05-10 NOTE — Assessment & Plan Note (Signed)
I am ecstatic that patient has quit smoking

## 2016-05-10 NOTE — Assessment & Plan Note (Signed)
Suspect meniscal injury based on history; will start with plain films, refer to ortho

## 2016-05-20 ENCOUNTER — Ambulatory Visit
Admission: RE | Admit: 2016-05-20 | Discharge: 2016-05-20 | Disposition: A | Discharge status: Home / Self Care | Payer: Commercial Managed Care - HMO | Source: Ambulatory Visit | Attending: Orthopedic Surgery | Admitting: Orthopedic Surgery

## 2016-05-20 DIAGNOSIS — X58XXXA Exposure to other specified factors, initial encounter: Secondary | ICD-10-CM | POA: Diagnosis not present

## 2016-05-20 DIAGNOSIS — M2391 Unspecified internal derangement of right knee: Secondary | ICD-10-CM | POA: Diagnosis not present

## 2016-05-20 DIAGNOSIS — S83241A Other tear of medial meniscus, current injury, right knee, initial encounter: Secondary | ICD-10-CM | POA: Diagnosis not present

## 2016-05-20 DIAGNOSIS — S83511A Sprain of anterior cruciate ligament of right knee, initial encounter: Secondary | ICD-10-CM | POA: Diagnosis not present

## 2016-05-20 DIAGNOSIS — M23321 Other meniscus derangements, posterior horn of medial meniscus, right knee: Secondary | ICD-10-CM | POA: Diagnosis not present

## 2016-05-20 DIAGNOSIS — M1711 Unilateral primary osteoarthritis, right knee: Secondary | ICD-10-CM | POA: Insufficient documentation

## 2016-05-25 DIAGNOSIS — M1711 Unilateral primary osteoarthritis, right knee: Secondary | ICD-10-CM | POA: Diagnosis not present

## 2016-05-26 ENCOUNTER — Encounter: Payer: Self-pay | Admitting: Family Medicine

## 2016-06-01 ENCOUNTER — Encounter: Payer: Self-pay | Admitting: Family Medicine

## 2016-06-01 DIAGNOSIS — M25561 Pain in right knee: Secondary | ICD-10-CM

## 2016-06-02 NOTE — Telephone Encounter (Signed)
Referral to Dr. Marry Guan

## 2016-06-02 NOTE — Assessment & Plan Note (Signed)
Refer to Dr. Marry Guan per pt request

## 2016-06-06 ENCOUNTER — Encounter: Payer: Self-pay | Admitting: Family Medicine

## 2016-06-06 NOTE — Telephone Encounter (Signed)
Derrick Mckenzie, please see patient's request; thank you

## 2016-06-07 ENCOUNTER — Encounter: Payer: Self-pay | Admitting: Family Medicine

## 2016-06-09 ENCOUNTER — Encounter: Payer: Self-pay | Admitting: Family Medicine

## 2016-06-10 MED ORDER — AZELASTINE HCL 0.1 % NA SOLN: 1.0000 | Freq: Two times a day (BID) | NASAL | Status: DC

## 2016-07-05 DIAGNOSIS — M1711 Unilateral primary osteoarthritis, right knee: Secondary | ICD-10-CM | POA: Diagnosis not present

## 2016-07-06 ENCOUNTER — Encounter: Payer: Self-pay | Admitting: Family Medicine

## 2016-07-06 ENCOUNTER — Ambulatory Visit (INDEPENDENT_AMBULATORY_CARE_PROVIDER_SITE_OTHER): Payer: Medicare PPO | Admitting: Family Medicine

## 2016-07-06 VITALS — BP 122/80 | HR 85 | Temp 98.3°F | Resp 14 | Wt 317.0 lb

## 2016-07-06 DIAGNOSIS — I10 Essential (primary) hypertension: Secondary | ICD-10-CM | POA: Diagnosis not present

## 2016-07-06 DIAGNOSIS — Z01810 Encounter for preprocedural cardiovascular examination: Secondary | ICD-10-CM | POA: Diagnosis not present

## 2016-07-06 DIAGNOSIS — Z5181 Encounter for therapeutic drug level monitoring: Secondary | ICD-10-CM | POA: Diagnosis not present

## 2016-07-06 DIAGNOSIS — Z87891 Personal history of nicotine dependence: Secondary | ICD-10-CM

## 2016-07-06 DIAGNOSIS — E785 Hyperlipidemia, unspecified: Secondary | ICD-10-CM

## 2016-07-06 DIAGNOSIS — E039 Hypothyroidism, unspecified: Secondary | ICD-10-CM

## 2016-07-06 DIAGNOSIS — Z720 Tobacco use: Secondary | ICD-10-CM

## 2016-07-06 DIAGNOSIS — M25561 Pain in right knee: Secondary | ICD-10-CM

## 2016-07-06 NOTE — Assessment & Plan Note (Signed)
Check cmp 

## 2016-07-06 NOTE — Patient Instructions (Addendum)
Try to limit saturated fats in your diet (bologna, hot dogs, barbeque, cheeseburgers, hamburgers, steak, bacon, sausage, cheese, etc.) and get more fresh fruits, vegetables, and whole grains  Your goal blood pressure is less than 150 mmHg on top. Try to follow the DASH guidelines (DASH stands for Dietary Approaches to Stop Hypertension) Try to limit the sodium in your diet.  Ideally, consume less than 1.5 grams (less than 1,500mg ) per day. Do not add salt when cooking or at the table.  Check the sodium amount on labels when shopping, and choose items lower in sodium when given a choice. Avoid or limit foods that already contain a lot of sodium. Eat a diet rich in fruits and vegetables and whole grains.  Please do see Dr. Ubaldo Glassing to get cardiac clearance  Check out the information at familydoctor.org entitled "Nutrition for Weight Loss: What You Need to Know about Fad Diets" Try to lose between 1-2 pounds per week by taking in fewer calories and burning off more calories You can succeed by limiting portions, limiting foods dense in calories and fat, becoming more active, and drinking 8 glasses of water a day (64 ounces) Don't skip meals, especially breakfast, as skipping meals may alter your metabolism Do not use over-the-counter weight loss pills or gimmicks that claim rapid weight loss A healthy BMI (or body mass index) is between 18.5 and 24.9 You can calculate your ideal BMI at the [place] website [employer]fr

## 2016-07-06 NOTE — Assessment & Plan Note (Signed)
I am so proud of him for quitting

## 2016-07-06 NOTE — Progress Notes (Signed)
BP 122/80   Pulse 85   Temp 98.3 F (36.8 C) (Oral)   Resp 14   Wt (!) 317 lb (143.8 kg)   SpO2 93%   BMI 50.10 kg/m    Subjective:    Patient ID: Derrick Mckenzie, male    DOB: 01-08-1951, 65 y.o.   MRN: [id number]  HPI: Derrick Mckenzie is a 66 y.o. male  Chief Complaint  Patient presents with  . surgical clearance    right total knee replacement   He is going to have his right knee replaced; standing causes significant pain in the right knee 7 out of 10 usually; standing makes it worst He went to see ortho and they are going to do replacement MRI and then saw Dr. Jefm Bryant; he went there yesterday  MRI May 20, 2016 Popliteal Fossa: Moderate Baker cyst with multiple loose bodies within the Baker cyst.  IMPRESSION: 1. Tricompartmental cartilage abnormalities most severe in the medial femorotibial compartment. 2. Radial tear of the posterior horn of the medial meniscus with peripheral meniscal extrusion. 3. Maceration of the anterior horn of the lateral meniscus. Partial radial tear of the posterior horn of the lateral meniscus. 4. Chronic ACL tear.  Electronically Signed   By: Kathreen Devoid   On: 05/20/2016 11:02 -------------------------------------  He sees a cardiologist, Dr. Ubaldo Glassing and we'll get cardiac clearance from him; last stress test was 2015 or so  No skin infections; no hx of MRSA; has a spot on his right leg, has been there for years and has not changed and another doctor said it is not skin cancer and it's not growing No chronic cough, no sputum; no SHOB No chest pain No claudication No easy bleeding BP well-controlled  Depression screen [facility] 2/9 07/06/2016 04/12/2016 06/26/2015  Decreased Interest 0 0 0  Down, Depressed, Hopeless 0 0 0  PHQ - 2 Score 0 0 0   Relevant past medical, surgical, family and social history reviewed Past Medical History:  Diagnosis Date  . Hx of smoking 06/26/2015  . Hyperlipidemia   . Hypertension   . Hypothyroidism     . Morbid obesity ([place])    Past Surgical History:  Procedure Laterality Date  . HERNIA REPAIR    . TOTAL KNEE ARTHROPLASTY  1973   Family History  Problem Relation Age of Onset  . Cancer Mother     leukemia  . Diabetes Brother    Social History  Substance Use Topics  . Smoking status: Former Smoker    Packs/day: 0.00    Years: 10.00    Types: Cigarettes    Quit date: 02/03/2016  . Smokeless tobacco: Never Used  . Alcohol use 0.0 oz/week     Comment: socially   Interim medical history since last visit reviewed. Allergies and medications reviewed  Review of Systems Per HPI unless specifically indicated above     Objective:    BP 122/80   Pulse 85   Temp 98.3 F (36.8 C) (Oral)   Resp 14   Wt (!) 317 lb (143.8 kg)   SpO2 93%   BMI 50.10 kg/m   Wt Readings from Last 3 Encounters:  07/06/16 (!) 317 lb (143.8 kg)  04/12/16 (!) 313 lb (142 kg)  08/31/15 (!) 313 lb (142 kg)    Physical Exam  Constitutional: He appears well-developed and well-nourished. No distress.  HENT:  Head: Normocephalic and atraumatic.  Eyes: EOM are normal. No scleral icterus.  Neck: No thyromegaly present.  Cardiovascular: Normal  rate and regular rhythm.   Pulmonary/Chest: Effort normal and breath sounds normal.  Abdominal: Soft. Bowel sounds are normal. He exhibits no distension.  Musculoskeletal: He exhibits no edema.  Neurological: He is alert.  Skin: Skin is warm and dry. No pallor.  Psychiatric: He has a normal mood and affect. His behavior is normal. Judgment and thought content normal.      Assessment & Plan:   Problem List Items Addressed This Visit      Cardiovascular and Mediastinum   Hypertension    Well-controlled today; work on weight loss and hopefully we can start to decrease medicines in the future; DASH guidelines        Endocrine   Hypothyroidism    Check TSH      Relevant Orders   TSH (Completed)     Other   Right medial knee pain    Expecting knee  replacement soon; I will give medical clearance after labs are back; cardiac clearance to come from heart doctor      Morbid obesity ([place])    On hold with bariatric surgery until knee is completed      Medication monitoring encounter    Check cmp      Hyperlipidemia    Try to limit saturated fats; krill oil; check fasting labs another day      Relevant Orders   Lipid panel (Completed)   Hx of smoking    I am so proud of him for quitting      Encounter for pre-operative cardiovascular clearance - Primary    Refer to Dr. Ubaldo Glassing for pre-op clearance      Relevant Orders   CBC with Differential/Platelet (Completed)   Comprehensive Metabolic Panel (CMET) (Completed)   Ambulatory referral to Cardiology    Other Visit Diagnoses   None.     Follow up plan: No Follow-up on file.  An after-visit summary was printed and given to the patient at [facility].  Please see the patient instructions which may contain other information and recommendations beyond what is mentioned above in the assessment and plan.  No orders of the defined types were placed in this encounter.   Orders Placed This Encounter  Procedures  . CBC with Differential/Platelet  . Comprehensive Metabolic Panel (CMET)  . Lipid panel  . TSH  . Ambulatory referral to Cardiology

## 2016-07-06 NOTE — Assessment & Plan Note (Signed)
Well-controlled today; work on weight loss and hopefully we can start to decrease medicines in the future; DASH guidelines

## 2016-07-06 NOTE — Assessment & Plan Note (Signed)
Check TSH 

## 2016-07-06 NOTE — Assessment & Plan Note (Signed)
Refer to Dr. Ubaldo Glassing for pre-op clearance

## 2016-07-06 NOTE — Assessment & Plan Note (Signed)
On hold with bariatric surgery until knee is completed

## 2016-07-06 NOTE — Assessment & Plan Note (Signed)
Expecting knee replacement soon; I will give medical clearance after labs are back; cardiac clearance to come from heart doctor

## 2016-07-06 NOTE — Assessment & Plan Note (Signed)
Try to limit saturated fats; krill oil; check fasting labs another day

## 2016-07-07 ENCOUNTER — Other Ambulatory Visit: Payer: Self-pay

## 2016-07-07 DIAGNOSIS — E039 Hypothyroidism, unspecified: Secondary | ICD-10-CM

## 2016-07-07 DIAGNOSIS — E785 Hyperlipidemia, unspecified: Secondary | ICD-10-CM

## 2016-07-07 DIAGNOSIS — Z01810 Encounter for preprocedural cardiovascular examination: Secondary | ICD-10-CM

## 2016-07-07 LAB — CBC WITH DIFFERENTIAL/PLATELET
Basophils Absolute: 78 cells/uL (ref 0–200)
Basophils Relative: 1 %
Eosinophils Absolute: 234 cells/uL (ref 15–500)
Eosinophils Relative: 3 %
HCT: 43.3 % (ref 38.5–50.0)
Hemoglobin: 15 g/dL (ref 13.2–17.1)
Lymphocytes Relative: 19 %
Lymphs Abs: 1482 cells/uL (ref 850–3900)
MCH: 30.9 pg (ref 27.0–33.0)
MCHC: 34.6 g/dL (ref 32.0–36.0)
MCV: 89.1 fL (ref 80.0–100.0)
MPV: 9.8 fL (ref 7.5–12.5)
Monocytes Absolute: 546 cells/uL (ref 200–950)
Monocytes Relative: 7 %
Neutro Abs: 5460 cells/uL (ref 1500–7800)
Neutrophils Relative %: 70 %
Platelets: 234 10*3/uL (ref 140–400)
RBC: 4.86 MIL/uL (ref 4.20–5.80)
RDW: 14.4 % (ref 11.0–15.0)
WBC: 7.8 10*3/uL (ref 3.8–10.8)

## 2016-07-07 LAB — TSH: TSH: 9.95 mIU/L — ABNORMAL HIGH (ref 0.40–4.50)

## 2016-07-07 LAB — COMPREHENSIVE METABOLIC PANEL
ALT: 16 U/L (ref 9–46)
AST: 16 U/L (ref 10–35)
Albumin: 4.4 g/dL (ref 3.6–5.1)
Alkaline Phosphatase: 55 U/L (ref 40–115)
BUN: 20 mg/dL (ref 7–25)
CO2: 30 mmol/L (ref 20–31)
Calcium: 9.6 mg/dL (ref 8.6–10.3)
Chloride: 101 mmol/L (ref 98–110)
Creat: 1.16 mg/dL (ref 0.70–1.25)
Glucose, Bld: 140 mg/dL — ABNORMAL HIGH (ref 65–99)
Potassium: 4.1 mmol/L (ref 3.5–5.3)
Sodium: 138 mmol/L (ref 135–146)
Total Bilirubin: 0.6 mg/dL (ref 0.2–1.2)
Total Protein: 7.1 g/dL (ref 6.1–8.1)

## 2016-07-07 LAB — LIPID PANEL
Cholesterol: 198 mg/dL (ref 125–200)
HDL: 35 mg/dL — ABNORMAL LOW (ref >=40)
LDL Cholesterol: 98 mg/dL (ref <130)
Total CHOL/HDL Ratio: 5.7 Ratio — ABNORMAL HIGH (ref <=5.0)
Triglycerides: 323 mg/dL — ABNORMAL HIGH (ref <150)
VLDL: 65 mg/dL — ABNORMAL HIGH (ref <30)

## 2016-07-08 ENCOUNTER — Encounter: Payer: Self-pay | Admitting: Family Medicine

## 2016-07-08 ENCOUNTER — Other Ambulatory Visit: Payer: Medicare PPO

## 2016-07-12 ENCOUNTER — Encounter: Payer: Self-pay | Admitting: Family Medicine

## 2016-07-22 ENCOUNTER — Other Ambulatory Visit: Payer: Self-pay | Admitting: Family Medicine

## 2016-07-22 DIAGNOSIS — R739 Hyperglycemia, unspecified: Secondary | ICD-10-CM | POA: Insufficient documentation

## 2016-07-22 MED ORDER — ICOSAPENT ETHYL 1 G PO CAPS: 2.0000 | ORAL_CAPSULE | Freq: Two times a day (BID) | ORAL | 3 refills | Status: DC

## 2016-07-22 MED ORDER — LEVOTHYROXINE SODIUM 50 MCG PO TABS: ORAL_TABLET | ORAL | 0 refills | Status: DC

## 2016-07-22 NOTE — Progress Notes (Signed)
Note sent to patient about labs; come in for recheck fasting glucose and A1c Start vascepa (note to pharmacy to check w/patient first before sending) Start thyroid med

## 2016-07-22 NOTE — Assessment & Plan Note (Signed)
Check fasting glucose and A1c 

## 2016-07-28 ENCOUNTER — Encounter: Payer: Self-pay | Admitting: Family Medicine

## 2016-08-05 ENCOUNTER — Other Ambulatory Visit: Payer: Self-pay

## 2016-08-05 DIAGNOSIS — R739 Hyperglycemia, unspecified: Secondary | ICD-10-CM | POA: Diagnosis not present

## 2016-08-05 LAB — GLUCOSE, RANDOM: Glucose, Bld: 131 mg/dL — ABNORMAL HIGH (ref 65–99)

## 2016-08-06 LAB — HEMOGLOBIN A1C
Hgb A1c MFr Bld: 5.8 % — ABNORMAL HIGH (ref <5.7)
Mean Plasma Glucose: 120 mg/dL

## 2016-08-07 ENCOUNTER — Encounter: Payer: Self-pay | Admitting: Family Medicine

## 2016-08-09 DIAGNOSIS — M1711 Unilateral primary osteoarthritis, right knee: Secondary | ICD-10-CM | POA: Insufficient documentation

## 2016-08-17 DIAGNOSIS — I10 Essential (primary) hypertension: Secondary | ICD-10-CM | POA: Diagnosis not present

## 2016-08-18 ENCOUNTER — Telehealth: Payer: Self-pay | Admitting: Emergency Medicine

## 2016-08-18 DIAGNOSIS — Z6841 Body Mass Index (BMI) 40.0 and over, adult: Secondary | ICD-10-CM | POA: Diagnosis not present

## 2016-08-18 DIAGNOSIS — R0789 Other chest pain: Secondary | ICD-10-CM | POA: Diagnosis not present

## 2016-08-18 DIAGNOSIS — M1711 Unilateral primary osteoarthritis, right knee: Secondary | ICD-10-CM | POA: Diagnosis not present

## 2016-08-18 NOTE — Telephone Encounter (Signed)
[facility] called. Patient was there for pre op for knee surgery and was having chest pains and had abnormal EKG. Staff wanted [facility] referral sent for Dr. Ubaldo Glassing to see patient while he was in facility. Order faxed to [facility]

## 2016-08-24 DIAGNOSIS — I959 Hypotension, unspecified: Secondary | ICD-10-CM | POA: Diagnosis not present

## 2016-08-24 DIAGNOSIS — Z96651 Presence of right artificial knee joint: Secondary | ICD-10-CM | POA: Diagnosis not present

## 2016-08-24 DIAGNOSIS — M1711 Unilateral primary osteoarthritis, right knee: Secondary | ICD-10-CM | POA: Diagnosis not present

## 2016-08-24 DIAGNOSIS — R52 Pain, unspecified: Secondary | ICD-10-CM | POA: Diagnosis not present

## 2016-08-24 DIAGNOSIS — E039 Hypothyroidism, unspecified: Secondary | ICD-10-CM | POA: Diagnosis not present

## 2016-08-24 DIAGNOSIS — R400 Somnolence: Secondary | ICD-10-CM | POA: Diagnosis not present

## 2016-08-24 DIAGNOSIS — Z6841 Body Mass Index (BMI) 40.0 and over, adult: Secondary | ICD-10-CM | POA: Diagnosis not present

## 2016-08-24 DIAGNOSIS — M25561 Pain in right knee: Secondary | ICD-10-CM | POA: Diagnosis not present

## 2016-08-24 DIAGNOSIS — D72829 Elevated white blood cell count, unspecified: Secondary | ICD-10-CM | POA: Diagnosis not present

## 2016-08-24 DIAGNOSIS — I10 Essential (primary) hypertension: Secondary | ICD-10-CM | POA: Diagnosis not present

## 2016-08-24 DIAGNOSIS — D649 Anemia, unspecified: Secondary | ICD-10-CM | POA: Diagnosis not present

## 2016-08-24 DIAGNOSIS — R0902 Hypoxemia: Secondary | ICD-10-CM | POA: Diagnosis not present

## 2016-08-24 DIAGNOSIS — G8918 Other acute postprocedural pain: Secondary | ICD-10-CM | POA: Diagnosis not present

## 2016-08-26 ENCOUNTER — Telehealth: Payer: Self-pay | Admitting: Family Medicine

## 2016-08-26 NOTE — Telephone Encounter (Signed)
Called left message for [place] at [facility] to call. Referral for today's appointment with Dr. Jefm Bryant has been sent to [facility]. Spoke to office manager Miel, referral cannot be backed date to June

## 2016-08-26 NOTE — Telephone Encounter (Signed)
[facility] needs [facility] referral. diag code M17.11 and M23.91. DR IS HAROLD KERNODLE. NPI [id number]. Patient has already been seen. Started on 04-28-16 and patient has already had the surgery. Pt needs referral by this afternoo for the patient is coming back today.

## 2016-08-27 DIAGNOSIS — Z6841 Body Mass Index (BMI) 40.0 and over, adult: Secondary | ICD-10-CM | POA: Diagnosis not present

## 2016-08-27 DIAGNOSIS — Z87891 Personal history of nicotine dependence: Secondary | ICD-10-CM | POA: Diagnosis not present

## 2016-08-27 DIAGNOSIS — Z471 Aftercare following joint replacement surgery: Secondary | ICD-10-CM | POA: Diagnosis not present

## 2016-08-27 DIAGNOSIS — I10 Essential (primary) hypertension: Secondary | ICD-10-CM | POA: Diagnosis not present

## 2016-08-27 DIAGNOSIS — Z96651 Presence of right artificial knee joint: Secondary | ICD-10-CM | POA: Diagnosis not present

## 2016-08-29 DIAGNOSIS — Z471 Aftercare following joint replacement surgery: Secondary | ICD-10-CM | POA: Diagnosis not present

## 2016-08-29 DIAGNOSIS — Z96651 Presence of right artificial knee joint: Secondary | ICD-10-CM | POA: Diagnosis not present

## 2016-08-29 DIAGNOSIS — I10 Essential (primary) hypertension: Secondary | ICD-10-CM | POA: Diagnosis not present

## 2016-08-29 DIAGNOSIS — Z6841 Body Mass Index (BMI) 40.0 and over, adult: Secondary | ICD-10-CM | POA: Diagnosis not present

## 2016-08-29 DIAGNOSIS — Z87891 Personal history of nicotine dependence: Secondary | ICD-10-CM | POA: Diagnosis not present

## 2016-08-29 DIAGNOSIS — M25561 Pain in right knee: Secondary | ICD-10-CM | POA: Diagnosis not present

## 2016-08-31 DIAGNOSIS — I10 Essential (primary) hypertension: Secondary | ICD-10-CM | POA: Diagnosis not present

## 2016-08-31 DIAGNOSIS — Z6841 Body Mass Index (BMI) 40.0 and over, adult: Secondary | ICD-10-CM | POA: Diagnosis not present

## 2016-08-31 DIAGNOSIS — Z87891 Personal history of nicotine dependence: Secondary | ICD-10-CM | POA: Diagnosis not present

## 2016-08-31 DIAGNOSIS — Z96651 Presence of right artificial knee joint: Secondary | ICD-10-CM | POA: Diagnosis not present

## 2016-08-31 DIAGNOSIS — Z471 Aftercare following joint replacement surgery: Secondary | ICD-10-CM | POA: Diagnosis not present

## 2016-09-02 DIAGNOSIS — Z87891 Personal history of nicotine dependence: Secondary | ICD-10-CM | POA: Diagnosis not present

## 2016-09-02 DIAGNOSIS — Z6841 Body Mass Index (BMI) 40.0 and over, adult: Secondary | ICD-10-CM | POA: Diagnosis not present

## 2016-09-02 DIAGNOSIS — I10 Essential (primary) hypertension: Secondary | ICD-10-CM | POA: Diagnosis not present

## 2016-09-02 DIAGNOSIS — Z471 Aftercare following joint replacement surgery: Secondary | ICD-10-CM | POA: Diagnosis not present

## 2016-09-02 DIAGNOSIS — Z96651 Presence of right artificial knee joint: Secondary | ICD-10-CM | POA: Diagnosis not present

## 2016-09-05 DIAGNOSIS — Z6841 Body Mass Index (BMI) 40.0 and over, adult: Secondary | ICD-10-CM | POA: Diagnosis not present

## 2016-09-05 DIAGNOSIS — I10 Essential (primary) hypertension: Secondary | ICD-10-CM | POA: Diagnosis not present

## 2016-09-05 DIAGNOSIS — Z96651 Presence of right artificial knee joint: Secondary | ICD-10-CM | POA: Diagnosis not present

## 2016-09-05 DIAGNOSIS — Z471 Aftercare following joint replacement surgery: Secondary | ICD-10-CM | POA: Diagnosis not present

## 2016-09-05 DIAGNOSIS — Z87891 Personal history of nicotine dependence: Secondary | ICD-10-CM | POA: Diagnosis not present

## 2016-09-06 DIAGNOSIS — Z96651 Presence of right artificial knee joint: Secondary | ICD-10-CM | POA: Diagnosis not present

## 2016-09-06 DIAGNOSIS — Z6841 Body Mass Index (BMI) 40.0 and over, adult: Secondary | ICD-10-CM | POA: Diagnosis not present

## 2016-09-06 DIAGNOSIS — I10 Essential (primary) hypertension: Secondary | ICD-10-CM | POA: Diagnosis not present

## 2016-09-06 DIAGNOSIS — Z87891 Personal history of nicotine dependence: Secondary | ICD-10-CM | POA: Diagnosis not present

## 2016-09-06 DIAGNOSIS — Z471 Aftercare following joint replacement surgery: Secondary | ICD-10-CM | POA: Diagnosis not present

## 2016-09-08 DIAGNOSIS — Z96651 Presence of right artificial knee joint: Secondary | ICD-10-CM | POA: Insufficient documentation

## 2016-09-09 DIAGNOSIS — I10 Essential (primary) hypertension: Secondary | ICD-10-CM | POA: Diagnosis not present

## 2016-09-09 DIAGNOSIS — Z87891 Personal history of nicotine dependence: Secondary | ICD-10-CM | POA: Diagnosis not present

## 2016-09-09 DIAGNOSIS — Z96651 Presence of right artificial knee joint: Secondary | ICD-10-CM | POA: Diagnosis not present

## 2016-09-09 DIAGNOSIS — Z471 Aftercare following joint replacement surgery: Secondary | ICD-10-CM | POA: Diagnosis not present

## 2016-09-09 DIAGNOSIS — Z6841 Body Mass Index (BMI) 40.0 and over, adult: Secondary | ICD-10-CM | POA: Diagnosis not present

## 2016-09-19 ENCOUNTER — Encounter: Payer: Self-pay | Admitting: Family Medicine

## 2016-09-24 DIAGNOSIS — M1711 Unilateral primary osteoarthritis, right knee: Secondary | ICD-10-CM | POA: Diagnosis not present

## 2016-09-24 DIAGNOSIS — Z96651 Presence of right artificial knee joint: Secondary | ICD-10-CM | POA: Diagnosis not present

## 2016-09-27 ENCOUNTER — Telehealth: Payer: Self-pay | Admitting: Family Medicine

## 2016-09-27 ENCOUNTER — Ambulatory Visit (INDEPENDENT_AMBULATORY_CARE_PROVIDER_SITE_OTHER): Payer: Medicare PPO | Admitting: Family Medicine

## 2016-09-27 ENCOUNTER — Encounter: Payer: Self-pay | Admitting: Family Medicine

## 2016-09-27 ENCOUNTER — Ambulatory Visit
Admission: RE | Admit: 2016-09-27 | Discharge: 2016-09-27 | Disposition: A | Discharge status: Home / Self Care | Payer: Commercial Managed Care - HMO | Source: Ambulatory Visit | Attending: Family Medicine | Admitting: Family Medicine

## 2016-09-27 ENCOUNTER — Other Ambulatory Visit: Payer: Self-pay | Admitting: Family Medicine

## 2016-09-27 VITALS — BP 128/64 | HR 96 | Temp 98.0°F | Resp 14 | Wt 298.0 lb

## 2016-09-27 DIAGNOSIS — N200 Calculus of kidney: Secondary | ICD-10-CM | POA: Insufficient documentation

## 2016-09-27 DIAGNOSIS — R1013 Epigastric pain: Secondary | ICD-10-CM

## 2016-09-27 DIAGNOSIS — E039 Hypothyroidism, unspecified: Secondary | ICD-10-CM | POA: Diagnosis not present

## 2016-09-27 DIAGNOSIS — I10 Essential (primary) hypertension: Secondary | ICD-10-CM | POA: Diagnosis not present

## 2016-09-27 DIAGNOSIS — E782 Mixed hyperlipidemia: Secondary | ICD-10-CM

## 2016-09-27 DIAGNOSIS — R933 Abnormal findings on diagnostic imaging of other parts of digestive tract: Secondary | ICD-10-CM | POA: Insufficient documentation

## 2016-09-27 DIAGNOSIS — K259 Gastric ulcer, unspecified as acute or chronic, without hemorrhage or perforation: Secondary | ICD-10-CM

## 2016-09-27 DIAGNOSIS — B9681 Helicobacter pylori [H. pylori] as the cause of diseases classified elsewhere: Secondary | ICD-10-CM | POA: Insufficient documentation

## 2016-09-27 DIAGNOSIS — R739 Hyperglycemia, unspecified: Secondary | ICD-10-CM

## 2016-09-27 DIAGNOSIS — K3189 Other diseases of stomach and duodenum: Secondary | ICD-10-CM

## 2016-09-27 DIAGNOSIS — K8020 Calculus of gallbladder without cholecystitis without obstruction: Secondary | ICD-10-CM

## 2016-09-27 DIAGNOSIS — R634 Abnormal weight loss: Secondary | ICD-10-CM

## 2016-09-27 DIAGNOSIS — R1114 Bilious vomiting: Secondary | ICD-10-CM

## 2016-09-27 DIAGNOSIS — I700 Atherosclerosis of aorta: Secondary | ICD-10-CM | POA: Diagnosis not present

## 2016-09-27 DIAGNOSIS — I2510 Atherosclerotic heart disease of native coronary artery without angina pectoris: Secondary | ICD-10-CM | POA: Diagnosis not present

## 2016-09-27 DIAGNOSIS — Z87891 Personal history of nicotine dependence: Secondary | ICD-10-CM | POA: Diagnosis not present

## 2016-09-27 DIAGNOSIS — R112 Nausea with vomiting, unspecified: Secondary | ICD-10-CM | POA: Insufficient documentation

## 2016-09-27 LAB — COMPREHENSIVE METABOLIC PANEL
ALT: 12 U/L — ABNORMAL LOW (ref 17–63)
AST: 18 U/L (ref 15–41)
Albumin: 4 g/dL (ref 3.5–5.0)
Alkaline Phosphatase: 65 U/L (ref 38–126)
Anion gap: 8 (ref 5–15)
BUN: 22 mg/dL — ABNORMAL HIGH (ref 6–20)
CO2: 30 mmol/L (ref 22–32)
Calcium: 9.7 mg/dL (ref 8.9–10.3)
Chloride: 99 mmol/L — ABNORMAL LOW (ref 101–111)
Creatinine, Ser: 1.02 mg/dL (ref 0.61–1.24)
GFR calc Af Amer: >60 mL/min (ref >60)
GFR calc non Af Amer: >60 mL/min (ref >60)
Glucose, Bld: 118 mg/dL — ABNORMAL HIGH (ref 65–99)
Potassium: 3.5 mmol/L (ref 3.5–5.1)
Sodium: 137 mmol/L (ref 135–145)
Total Bilirubin: 0.4 mg/dL (ref 0.3–1.2)
Total Protein: 7.8 g/dL (ref 6.5–8.1)

## 2016-09-27 LAB — CBC WITH DIFFERENTIAL/PLATELET
Basophils Absolute: 0.1 10*3/uL (ref 0–0.1)
Basophils Relative: 1 %
Eosinophils Absolute: 0.4 10*3/uL (ref 0–0.7)
Eosinophils Relative: 4 %
HCT: 39 % — ABNORMAL LOW (ref 40.0–52.0)
Hemoglobin: 13.4 g/dL (ref 13.0–18.0)
Lymphocytes Relative: 15 %
Lymphs Abs: 1.5 10*3/uL (ref 1.0–3.6)
MCH: 29.4 pg (ref 26.0–34.0)
MCHC: 34.2 g/dL (ref 32.0–36.0)
MCV: 85.8 fL (ref 80.0–100.0)
Monocytes Absolute: 0.7 10*3/uL (ref 0.2–1.0)
Monocytes Relative: 7 %
Neutro Abs: 7.4 10*3/uL — ABNORMAL HIGH (ref 1.4–6.5)
Neutrophils Relative %: 73 %
Platelets: 309 10*3/uL (ref 150–440)
RBC: 4.55 MIL/uL (ref 4.40–5.90)
RDW: 14.1 % (ref 11.5–14.5)
WBC: 10.2 10*3/uL (ref 3.8–10.6)

## 2016-09-27 LAB — LIPASE, BLOOD: Lipase: 33 U/L (ref 11–51)

## 2016-09-27 LAB — LIPID PANEL
Cholesterol: 182 mg/dL (ref 0–200)
HDL: 29 mg/dL — ABNORMAL LOW (ref >40)
LDL Cholesterol: 95 mg/dL (ref 0–99)
Total CHOL/HDL Ratio: 6.3 RATIO
Triglycerides: 291 mg/dL — ABNORMAL HIGH (ref <150)
VLDL: 58 mg/dL — ABNORMAL HIGH (ref 0–40)

## 2016-09-27 LAB — TSH: TSH: 7.171 u[IU]/mL — ABNORMAL HIGH (ref 0.350–4.500)

## 2016-09-27 LAB — AMYLASE: Amylase: 47 U/L (ref 28–100)

## 2016-09-27 MED ORDER — PANTOPRAZOLE SODIUM 40 MG PO TBEC: 40.0000 mg | DELAYED_RELEASE_TABLET | Freq: Every day | ORAL | 0 refills | Status: DC

## 2016-09-27 MED ORDER — LEVOTHYROXINE SODIUM 75 MCG PO TABS: 75.0000 ug | ORAL_TABLET | Freq: Every day | ORAL | 0 refills | Status: DC

## 2016-09-27 MED ORDER — IOPAMIDOL (ISOVUE-300) INJECTION 61%
100.0000 mL | Freq: Once | INTRAVENOUS | Status: AC | PRN
  Administered 2016-09-27: 100 mL via INTRAVENOUS

## 2016-09-27 MED ORDER — SUCRALFATE 1 G PO TABS: 1.0000 g | ORAL_TABLET | Freq: Three times a day (TID) | ORAL | 0 refills | Status: DC

## 2016-09-27 NOTE — Assessment & Plan Note (Addendum)
ddx discussed; with the associated weight loss, that is concerning for malignancy in morbidly obese patient with hx of smoking; will check H pylori, amylase, lipase, CBC (to evaluate for occult GI blood loss), CT scan; start PPI for short-term; would like to stop the meloxicam if patient can handle that, but will see what scan shows later today; if nothing else found and ddx leans more towards gastritis or esophagitis, then will stop the NSAID; reasons to go to ER reviewed (more severe pain, vomiting, etc)

## 2016-09-27 NOTE — Assessment & Plan Note (Signed)
Refer to gen surg; start carafate; continue PPI; stop meloxicam and aspirin; H pylori pending

## 2016-09-27 NOTE — Assessment & Plan Note (Signed)
1.9 cm calcified gallstone; refer to gen surg to also discuss while addressing the gastric wall thickening

## 2016-09-27 NOTE — Telephone Encounter (Signed)
I discussed CT scan findings with patient Refer to gen surg ASAP for upper endoscopy Ulcer disease in ddx, but have to r/o malignancy Start carafate; stop meloxicam; stop aspirin; continue PPI (just prescribed today) H pylori pending, he just got a specimen, will return tomorrow Consider referral to urologist for 6 mm kidney stone, but will put on hold for now, gastric issue most pressing; avoid iced tea, stay hydrated HDL too low, discussed Increase thyroid med, discussed

## 2016-09-27 NOTE — Assessment & Plan Note (Signed)
Patient has had high TG in the past, but not in the 1000+ range; will check lipids to make sure TG not excessively high which could precipitate pancreatitis

## 2016-09-27 NOTE — Assessment & Plan Note (Signed)
With sudden drop in weight, but this sounds to be unfortunately not intentional; work up for malignancy

## 2016-09-27 NOTE — Telephone Encounter (Signed)
Done, thank you!

## 2016-09-27 NOTE — Assessment & Plan Note (Signed)
Controlled.  

## 2016-09-27 NOTE — Telephone Encounter (Signed)
Patient is asking that you please send a supply of pantoprazole to [facility]

## 2016-09-27 NOTE — Assessment & Plan Note (Addendum)
Check labs; discussed ddx which includes malignancy, decreased caloric intake related to gastritis, esophagitis, duodenitis; doubt iatrogenic from over-replacement with levothyroxine, as his dose was higher in the past (I started him back on lower dose prior to surgery after he'd been off for a while, so as to not precipitate cardiac issues); will get labs and CT scan today

## 2016-09-27 NOTE — Assessment & Plan Note (Addendum)
ddx includes cholecystitis, pancreatitis, duodenitis; will get labs and CT scan

## 2016-09-27 NOTE — Patient Instructions (Addendum)
I do recommend a coated baby 81 mg aspirin daily, but take at least one hour BEFORE the meloxicam If tests suggest that you have gastritis or irritation of the lining of your stomach, we'll stop the meloxicam Start pantoprazole daily Have labs done at the hospital today so we'll get labs this afternoon We'll get the CT scan as soon as possible If symptoms get severe, go to the ER

## 2016-09-27 NOTE — Assessment & Plan Note (Signed)
Doubt weight loss is due to uncontrolled glycosuria; will check glucose today

## 2016-09-27 NOTE — Progress Notes (Signed)
BP 128/64   Pulse 96   Temp 98 F (36.7 C) (Oral)   Resp 14   Wt 298 lb (135.2 kg)   SpO2 96%   BMI 47.09 kg/m    Subjective:    Patient ID: Derrick Mckenzie, male    DOB: 02-Mar-1951, 65 y.o.   MRN: [id number]  HPI: Derrick Mckenzie is a 65 y.o. male  Chief Complaint  Patient presents with  . Abdominal Pain    epigastric   He has been having belly pain He gets nauseated and gets the saliva flowing; actually vomited this morning; going on for months; no blood in the emesis; bilious No fevers Appetite is okay, was reduced He has been losing weight over 5 weeks, 20 pounds over 5 weeks; food just doesn't take as good Not much acid reflux Not gnawing pain; moderate, just 1-2 out of 10 pain right now; not burning; just like he punched in the stomach No blood in the stool; constipated; took 3 stool softeners; couldn't go for a week after surgery on the narcotics; stopped the oxycodone 9 days ago Getting worse after surgery For months, he has had epigastric pain; going through to his back; radiates across  He thinks there may be back issues, started meloxicam on Monday No dry mouth; some vision blurring s/p right knee replacement; the knee has done so well Had been off the krill oil for a month and started back 5 days ago  Depression screen [facility] 2/9 09/27/2016 07/06/2016 04/12/2016 06/26/2015  Decreased Interest 0 0 0 0  Down, Depressed, Hopeless 0 0 0 0  PHQ - 2 Score 0 0 0 0   Relevant past medical, surgical, family and social history reviewed Past Medical History:  Diagnosis Date  . Hx of smoking 06/26/2015  . Hyperlipidemia   . Hypertension   . Hypothyroidism   . Morbid obesity ([place])    Past Surgical History:  Procedure Laterality Date  . HERNIA REPAIR    . REPLACEMENT TOTAL KNEE Right 2017  . TOTAL KNEE ARTHROPLASTY  1973   Family History  Problem Relation Age of Onset  . Cancer Mother     leukemia  . Diabetes Brother    Social History  Substance Use Topics  .  Smoking status: Former Smoker    Packs/day: 0.00    Years: 10.00    Types: Cigarettes    Quit date: 02/03/2016  . Smokeless tobacco: Never Used  . Alcohol use 0.0 oz/week     Comment: socially   Interim medical history since last visit reviewed. Allergies and medications reviewed  Review of Systems Per HPI unless specifically indicated above     Objective:    BP 128/64   Pulse 96   Temp 98 F (36.7 C) (Oral)   Resp 14   Wt 298 lb (135.2 kg)   SpO2 96%   BMI 47.09 kg/m   Wt Readings from Last 3 Encounters:  09/27/16 298 lb (135.2 kg)  07/06/16 (!) 317 lb (143.8 kg)  04/12/16 (!) 313 lb (142 kg)    Physical Exam  Constitutional: He appears well-developed and well-nourished. No distress.  Weight loss of 19 pounds over the last 2-1/2 months; morbidly obese  HENT:  Head: Normocephalic and atraumatic.  Eyes: EOM are normal. No scleral icterus.  Neck: No thyromegaly present.  Cardiovascular: Normal rate and regular rhythm.   Pulmonary/Chest: Effort normal and breath sounds normal.  Abdominal: Soft. Bowel sounds are normal. He exhibits no distension and no  mass. There is tenderness (mild epigastric tenderness). There is no guarding.  No RUQ tenderness  Musculoskeletal: He exhibits no edema.  Surgical scar over the right anterior knee; no effusion, no erythema  Neurological: He is alert.  Skin: Skin is warm and dry. He is not diaphoretic. No pallor.  Psychiatric: He has a normal mood and affect. His behavior is normal. Judgment and thought content normal. His mood appears not anxious. He does not exhibit a depressed mood.   Results for orders placed or performed in visit on 08/05/16  Glucose  Result Value Ref Range   Glucose, Bld 131 (H) 65 - 99 mg/dL  Hemoglobin A1c  Result Value Ref Range   Hgb A1c MFr Bld 5.8 (H) <5.7 %   Mean Plasma Glucose 120 mg/dL      Assessment & Plan:   Problem List Items Addressed This Visit      Cardiovascular and Mediastinum    Hypertension    Controlled        Digestive   Nausea & vomiting    ddx includes cholecystitis, pancreatitis, duodenitis; will get labs and CT scan      Relevant Orders   CT ABDOMEN W CONTRAST (Completed)     Endocrine   Hypothyroidism    Check TSH; patient is on thyroid replacement, but has previously been on higher doses; I doubt his weight loss is due to over-replacement (iatrogenic hyperthyroidism)      Relevant Medications   levothyroxine (SYNTHROID, LEVOTHROID) 75 MCG tablet     Other   Morbid obesity ([place])    With sudden drop in weight, but this sounds to be unfortunately not intentional; work up for malignancy      Hyperlipidemia    Patient has had high TG in the past, but not in the 1000+ range; will check lipids to make sure TG not excessively high which could precipitate pancreatitis      Hyperglycemia    Doubt weight loss is due to uncontrolled glycosuria; will check glucose today      Hx of smoking    Quit smoking March 2017; increases risk of esophageal, gastric malignancy; getting CT scan      Epigastric pain    ddx discussed; with the associated weight loss, that is concerning for malignancy in morbidly obese patient with hx of smoking; will check H pylori, amylase, lipase, CBC (to evaluate for occult GI blood loss), CT scan; start PPI for short-term; would like to stop the meloxicam if patient can handle that, but will see what scan shows later today; if nothing else found and ddx leans more towards gastritis or esophagitis, then will stop the NSAID; reasons to go to ER reviewed (more severe pain, vomiting, etc)      Relevant Orders   CBC with Differential/Platelet   Lipid panel   Amylase   Lipase   H. pylori antigen, stool   Comprehensive metabolic panel   CT ABDOMEN W CONTRAST (Completed)   Abnormal weight loss    Check labs; discussed ddx which includes malignancy, decreased caloric intake related to gastritis, esophagitis, duodenitis; doubt  iatrogenic from over-replacement with levothyroxine, as his dose was higher in the past (I started him back on lower dose prior to surgery after he'd been off for a while, so as to not precipitate cardiac issues); will get labs and CT scan today      Relevant Orders   TSH   CT ABDOMEN W CONTRAST (Completed)    Other Visit Diagnoses  None.      Follow up plan: No Follow-up on file.  An after-visit summary was printed and given to the patient at [facility].  Please see the patient instructions which may contain other information and recommendations beyond what is mentioned above in the assessment and plan.  Meds ordered this encounter  Medications  . Krill Oil 1000 MG CAPS    Sig: Take by mouth.  . DISCONTD: pantoprazole (PROTONIX) 40 MG tablet    Sig: Take 1 tablet (40 mg total) by mouth daily.    Dispense:  30 tablet    Refill:  0  . levothyroxine (SYNTHROID, LEVOTHROID) 75 MCG tablet    Sig: Take 1 tablet (75 mcg total) by mouth daily.    Dispense:  90 tablet    Refill:  0    New instructions; cancel any outstanding thyroid med doses    Orders Placed This Encounter  Procedures  . CT ABDOMEN W CONTRAST  . TSH  . CBC with Differential/Platelet  . Lipid panel  . Amylase  . Lipase  . H. pylori antigen, stool  . Comprehensive metabolic panel

## 2016-09-27 NOTE — Assessment & Plan Note (Signed)
Quit smoking March 2017; increases risk of esophageal, gastric malignancy; getting CT scan

## 2016-09-27 NOTE — Assessment & Plan Note (Signed)
Check TSH; patient is on thyroid replacement, but has previously been on higher doses; I doubt his weight loss is due to over-replacement (iatrogenic hyperthyroidism)

## 2016-09-27 NOTE — Progress Notes (Signed)
rx for sucralfate locally

## 2016-09-28 ENCOUNTER — Other Ambulatory Visit
Admission: RE | Admit: 2016-09-28 | Discharge: 2016-09-28 | Disposition: A | Discharge status: Home / Self Care | Payer: Commercial Managed Care - HMO | Source: Ambulatory Visit | Attending: Family Medicine | Admitting: Family Medicine

## 2016-09-28 ENCOUNTER — Encounter: Payer: Self-pay | Admitting: Family Medicine

## 2016-09-28 DIAGNOSIS — R1013 Epigastric pain: Secondary | ICD-10-CM | POA: Insufficient documentation

## 2016-09-30 ENCOUNTER — Encounter: Payer: Self-pay | Admitting: Family Medicine

## 2016-09-30 LAB — H. PYLORI ANTIGEN, STOOL: H. Pylori Stool Ag, Eia: NEGATIVE

## 2016-09-30 NOTE — Telephone Encounter (Signed)
Please make sure Dr. Antionette Char office knows that the request for the EGD is as important, if not more so, than the gallbladder issue and we're hoping he can do both; please call them, then contact patient; thank you

## 2016-10-03 ENCOUNTER — Other Ambulatory Visit: Payer: Self-pay

## 2016-10-03 ENCOUNTER — Encounter: Payer: Self-pay | Admitting: Family Medicine

## 2016-10-03 NOTE — Telephone Encounter (Signed)
Ins requesting 90 day to [facility]

## 2016-10-03 NOTE — Telephone Encounter (Signed)
90 days is not appropriate; this is short term

## 2016-10-04 ENCOUNTER — Telehealth: Payer: Self-pay

## 2016-10-04 ENCOUNTER — Other Ambulatory Visit: Payer: Self-pay

## 2016-10-04 ENCOUNTER — Ambulatory Visit: Payer: Medicare PPO | Admitting: Surgery

## 2016-10-04 ENCOUNTER — Encounter: Payer: Self-pay | Admitting: *Deleted

## 2016-10-04 NOTE — Telephone Encounter (Signed)
Pt sent an email to Dr. lada asking questions about his gallbladder appointment for today. Pt did not want to pay a 45 copy for something he stated that Dr. lada stated he didn't need.Pt need an EGD.  I was able to reach Dr. Burt Knack office and cancel the appointment for day and  schedule him an appointment for his EGD tomorrow morning.

## 2016-10-04 NOTE — Telephone Encounter (Signed)
I was able to cancel the appointment for his gallbladder since its not something you are not concern about and made the appointment for his EGD for 10/04/2016 @8am .

## 2016-10-04 NOTE — Discharge Instructions (Signed)

## 2016-10-04 NOTE — Telephone Encounter (Signed)
I was able to schedule his EGD for tomorrow at the [facility] location. They will talk to the pt about his EGD.

## 2016-10-04 NOTE — Telephone Encounter (Signed)
I talked with the patient twice about this, as well as staff here, and the staff at [facility] I am indeed concerned about his gallbladder and had originally written a referral for the patient to see a general surgeon who I had hoped could see the pt and do an EGD and talk with him about his gallstone I had hoped to save the patient an office co-pay by having both problems addressed by one provider Please see the original referral ----------------- The patient's appt with the surgeon was canceled today, which was not my intention; I am still concerned about his gallbladder, and explained that to him on the phone, that this could make him sick, though I think the mucosal wall thickening was the more likely culprit for the weight loss and pain through to his back; he still needs to have both issues addressed ------------------ I spoke with staff at the surgical office where he was originally referred; their surgeons do not do EGDs and it appears that they changed the appt for him to see a gastroenterologist I told them KEEP that appointment tomorrow morning; I do not want to cancel that, but it was my original intention to have him see a surgeon who could address both --------------------- I would have appreciated knowing that their surgeons don't do endoscopies, and we could have referred him to another office I tried to explain this to the patient; I still want him to see a surgeon, and apologized for wherever the confusion came from that necessitated him seeing two different specialists ----------------------- Discussed w/staff and office manager

## 2016-10-05 ENCOUNTER — Ambulatory Visit
Admission: RE | Admit: 2016-10-05 | Discharge: 2016-10-05 | Disposition: A | Discharge status: Home / Self Care | Payer: Commercial Managed Care - HMO | Source: Ambulatory Visit | Attending: Gastroenterology | Admitting: Gastroenterology

## 2016-10-05 ENCOUNTER — Ambulatory Visit: Payer: Commercial Managed Care - HMO | Admitting: Anesthesiology

## 2016-10-05 ENCOUNTER — Encounter: Payer: Self-pay | Admitting: Family Medicine

## 2016-10-05 ENCOUNTER — Encounter
Admission: RE | Disposition: A | Discharge status: Home / Self Care | Payer: Self-pay | Source: Ambulatory Visit | Attending: Gastroenterology

## 2016-10-05 ENCOUNTER — Other Ambulatory Visit: Payer: Self-pay | Admitting: Gastroenterology

## 2016-10-05 DIAGNOSIS — G4730 Sleep apnea, unspecified: Secondary | ICD-10-CM | POA: Diagnosis not present

## 2016-10-05 DIAGNOSIS — K2930 Chronic superficial gastritis without bleeding: Secondary | ICD-10-CM | POA: Diagnosis not present

## 2016-10-05 DIAGNOSIS — R109 Unspecified abdominal pain: Secondary | ICD-10-CM | POA: Diagnosis present

## 2016-10-05 DIAGNOSIS — E039 Hypothyroidism, unspecified: Secondary | ICD-10-CM | POA: Diagnosis not present

## 2016-10-05 DIAGNOSIS — Z96651 Presence of right artificial knee joint: Secondary | ICD-10-CM | POA: Insufficient documentation

## 2016-10-05 DIAGNOSIS — K2950 Unspecified chronic gastritis without bleeding: Secondary | ICD-10-CM | POA: Insufficient documentation

## 2016-10-05 DIAGNOSIS — R1013 Epigastric pain: Secondary | ICD-10-CM

## 2016-10-05 DIAGNOSIS — Z6841 Body Mass Index (BMI) 40.0 and over, adult: Secondary | ICD-10-CM | POA: Insufficient documentation

## 2016-10-05 DIAGNOSIS — K253 Acute gastric ulcer without hemorrhage or perforation: Secondary | ICD-10-CM

## 2016-10-05 DIAGNOSIS — K259 Gastric ulcer, unspecified as acute or chronic, without hemorrhage or perforation: Secondary | ICD-10-CM

## 2016-10-05 DIAGNOSIS — I10 Essential (primary) hypertension: Secondary | ICD-10-CM | POA: Insufficient documentation

## 2016-10-05 DIAGNOSIS — Z87891 Personal history of nicotine dependence: Secondary | ICD-10-CM | POA: Insufficient documentation

## 2016-10-05 DIAGNOSIS — Z79899 Other long term (current) drug therapy: Secondary | ICD-10-CM | POA: Diagnosis not present

## 2016-10-05 DIAGNOSIS — B9681 Helicobacter pylori [H. pylori] as the cause of diseases classified elsewhere: Secondary | ICD-10-CM | POA: Diagnosis not present

## 2016-10-05 DIAGNOSIS — K8020 Calculus of gallbladder without cholecystitis without obstruction: Secondary | ICD-10-CM

## 2016-10-05 DIAGNOSIS — K31811 Angiodysplasia of stomach and duodenum with bleeding: Secondary | ICD-10-CM | POA: Diagnosis not present

## 2016-10-05 DIAGNOSIS — G4720 Circadian rhythm sleep disorder, unspecified type: Secondary | ICD-10-CM

## 2016-10-05 DIAGNOSIS — N200 Calculus of kidney: Secondary | ICD-10-CM

## 2016-10-05 DIAGNOSIS — M170 Bilateral primary osteoarthritis of knee: Secondary | ICD-10-CM | POA: Insufficient documentation

## 2016-10-05 DIAGNOSIS — K257 Chronic gastric ulcer without hemorrhage or perforation: Secondary | ICD-10-CM | POA: Insufficient documentation

## 2016-10-05 DIAGNOSIS — D130 Benign neoplasm of esophagus: Secondary | ICD-10-CM | POA: Insufficient documentation

## 2016-10-05 DIAGNOSIS — K219 Gastro-esophageal reflux disease without esophagitis: Secondary | ICD-10-CM | POA: Insufficient documentation

## 2016-10-05 HISTORY — DX: Adverse effect of unspecified anesthetic, initial encounter: T41.45XA

## 2016-10-05 HISTORY — DX: Calculus of kidney: N20.0

## 2016-10-05 HISTORY — DX: Sleep apnea, unspecified: G47.30

## 2016-10-05 HISTORY — DX: Gastro-esophageal reflux disease without esophagitis: K21.9

## 2016-10-05 HISTORY — DX: Family history of other specified conditions: Z84.89

## 2016-10-05 HISTORY — DX: Unspecified osteoarthritis, unspecified site: M19.90

## 2016-10-05 HISTORY — DX: Other complications of anesthesia, initial encounter: T88.59XA

## 2016-10-05 HISTORY — PX: ESOPHAGOGASTRODUODENOSCOPY (EGD) WITH PROPOFOL: SHX5813

## 2016-10-05 SURGERY — ESOPHAGOGASTRODUODENOSCOPY (EGD) WITH PROPOFOL
Anesthesia: Monitor Anesthesia Care | Site: Throat | Wound class: Clean Contaminated

## 2016-10-05 MED ORDER — SUCRALFATE 1 G PO TABS: 1.0000 g | ORAL_TABLET | Freq: Three times a day (TID) | ORAL | 0 refills | Status: DC

## 2016-10-05 MED ORDER — GLYCOPYRROLATE 0.2 MG/ML IJ SOLN
INTRAMUSCULAR | Status: DC | PRN
  Administered 2016-10-05: 0.2 mg via INTRAVENOUS

## 2016-10-05 MED ORDER — LACTATED RINGERS IV SOLN
INTRAVENOUS | Status: DC
  Administered 2016-10-05: 09:00:00 via INTRAVENOUS

## 2016-10-05 MED ORDER — OXYCODONE HCL 5 MG/5ML PO SOLN: 5.0000 mg | Freq: Once | ORAL | Status: DC | PRN

## 2016-10-05 MED ORDER — STERILE WATER FOR IRRIGATION IR SOLN
Status: DC | PRN
  Administered 2016-10-05: 09:00:00

## 2016-10-05 MED ORDER — LIDOCAINE HCL (CARDIAC) 20 MG/ML IV SOLN
INTRAVENOUS | Status: DC | PRN
  Administered 2016-10-05: 40 mg via INTRAVENOUS

## 2016-10-05 MED ORDER — OXYCODONE HCL 5 MG PO TABS: 5.0000 mg | ORAL_TABLET | Freq: Once | ORAL | Status: DC | PRN

## 2016-10-05 MED ORDER — PROPOFOL 10 MG/ML IV BOLUS
INTRAVENOUS | Status: DC | PRN
  Administered 2016-10-05: 70 mg via INTRAVENOUS
  Administered 2016-10-05 (×5): 20 mg via INTRAVENOUS
  Administered 2016-10-05: 30 mg via INTRAVENOUS

## 2016-10-05 SURGICAL SUPPLY — 32 items
BALLN DILATOR 10-12 8 (BALLOONS)
BALLN DILATOR 12-15 8 (BALLOONS)
BALLN DILATOR 15-18 8 (BALLOONS)
BALLN DILATOR CRE 0-12 8 (BALLOONS)
BALLN DILATOR ESOPH 8 10 CRE (MISCELLANEOUS) IMPLANT
BALLOON DILATOR 12-15 8 (BALLOONS) IMPLANT
BALLOON DILATOR 15-18 8 (BALLOONS) IMPLANT
BALLOON DILATOR CRE 0-12 8 (BALLOONS) IMPLANT
BLOCK BITE 60FR ADLT L/F GRN (MISCELLANEOUS) ×3 IMPLANT
CANISTER SUCT 1200ML W/VALVE (MISCELLANEOUS) ×3 IMPLANT
CLIP HMST 235XBRD CATH ROT (MISCELLANEOUS) IMPLANT
CLIP RESOLUTION 360 11X235 (MISCELLANEOUS)
FCP ESCP3.2XJMB 240X2.8X (MISCELLANEOUS) ×1
FORCEPS BIOP RAD 4 LRG CAP 4 (CUTTING FORCEPS) IMPLANT
FORCEPS BIOP RJ4 240 W/NDL (MISCELLANEOUS) ×2
FORCEPS ESCP3.2XJMB 240X2.8X (MISCELLANEOUS) ×1 IMPLANT
GOWN CVR UNV OPN BCK APRN NK (MISCELLANEOUS) ×2 IMPLANT
GOWN ISOL THUMB LOOP REG UNIV (MISCELLANEOUS) ×4
INJECTOR VARIJECT VIN23 (MISCELLANEOUS) IMPLANT
KIT DEFENDO VALVE AND CONN (KITS) IMPLANT
KIT ENDO PROCEDURE OLY (KITS) ×3 IMPLANT
MARKER SPOT ENDO TATTOO 5ML (MISCELLANEOUS) IMPLANT
PAD GROUND ADULT SPLIT (MISCELLANEOUS) IMPLANT
RETRIEVER NET PLAT FOOD (MISCELLANEOUS) IMPLANT
SNARE SHORT THROW 13M SML OVAL (MISCELLANEOUS) IMPLANT
SNARE SHORT THROW 30M LRG OVAL (MISCELLANEOUS) IMPLANT
SPOT EX ENDOSCOPIC TATTOO (MISCELLANEOUS)
SYR INFLATION 60ML (SYRINGE) IMPLANT
TRAP ETRAP POLY (MISCELLANEOUS) IMPLANT
VARIJECT INJECTOR VIN23 (MISCELLANEOUS)
WATER STERILE IRR 250ML POUR (IV SOLUTION) ×3 IMPLANT
WIRE CRE 18-20MM 8CM F G (MISCELLANEOUS) IMPLANT

## 2016-10-05 NOTE — Assessment & Plan Note (Signed)
Refer to uro

## 2016-10-05 NOTE — Op Note (Signed)
[facility] Patient Name: Derrick Mckenzie Procedure Date: 10/05/2016 9:08 AM MRN: [id number] Account #: [id number] Date of Birth: February 23, 1951 Admit Type: Outpatient Age: 65 Room: [facility] OR ROOM 01 Gender: Male Note Status: Finalized Procedure:            Upper GI endoscopy Indications:          Epigastric abdominal pain Providers:            Jonathon Bellows MD, MD Referring MD:         Arnetha Courser (Referring MD) Medicines:            Monitored Anesthesia Care Complications:        No immediate complications. Procedure:            Pre-Anesthesia Assessment:                       - ASA Grade Assessment: II - A patient with mild                        systemic disease.                       - Prior to the procedure, a History and Physical was                        performed, and patient medications, allergies and                        sensitivities were reviewed. The patient's tolerance of                        previous anesthesia was reviewed.                       - Prior to the procedure, a History and Physical was                        performed, and patient medications, allergies and                        sensitivities were reviewed. The patient's tolerance of                        previous anesthesia was reviewed.                       After obtaining informed consent, the endoscope was                        passed under direct vision. Throughout the procedure,                        the patient's blood pressure, pulse, and oxygen                        saturations were monitored continuously. The Olympus                        GIF H180J colonscope [id number] was introduced  through the mouth, and advanced to the third part of                        duodenum. The upper GI endoscopy was accomplished with                        ease. The patient tolerated the procedure well. Findings:      The examined duodenum was  normal.      A single 8 mm polyp with no bleeding was found 25 cm from the incisors.       The polyp was removed with a cold biopsy forceps. Polyp resection was       incomplete, and the resected tissue was partially retrieved.      One non-obstructing non-bleeding cratered gastric ulcer of moderate       severity with no stigmata of bleeding was found in the stomach. The       ulcer was in the pre pyloric area, with deformity of the area. The       lesion was 2 cm in largest dimension. There is no evidence of       perforation. This was biopsied with a cold forceps for histology and       Helicobacter pylori testing. Biopsy taken from ulcer edge, felt very hard Impression:           - Normal examined duodenum.                       - Esophageal polyp(s) were found. Polyp resection was                        incomplete, and the resected tissue was partially                        retrieved.                       - Non-obstructing non-bleeding gastric ulcer with no                        stigmata of bleeding. H. pylori induced etiology. There                        is no evidence of perforation. Biopsied. Recommendation:       - Patient has a contact number available for                        emergencies. The signs and symptoms of potential                        delayed complications were discussed with the patient.                        Return to normal activities tomorrow. Written discharge                        instructions were provided to the patient.                       - Resume previous diet today.                       -  Await pathology results.                       - Return to my office in 8 weeks.                       - Reepat EGD in 6 weeks to ensure healing of ulcer.                        Commence on sucralfate QID Procedure Code(s):    --- Professional ---                       [phone], Esophagogastroduodenoscopy, flexible, transoral;                        with  biopsy, single or multiple Diagnosis Code(s):    --- Professional ---                       K22.8, Other specified diseases of esophagus                       [id number], Helicobacter pylori [H. pylori] as the cause of                        diseases classified elsewhere                       K25.9, Gastric ulcer, unspecified as acute or chronic,                        without hemorrhage or perforation                       R10.13, Epigastric pain CPT copyright 2016 American Medical Association. All rights reserved. The codes documented in this report are preliminary and upon coder review may  be revised to meet current compliance requirements. Jonathon Bellows, MD Jonathon Bellows MD, MD 10/05/2016 9:38:10 AM This report has been signed electronically. Number of Addenda: 0 Note Initiated On: 10/05/2016 9:08 AM Total Procedure Duration: 0 hours 8 minutes 47 seconds       [facility]

## 2016-10-05 NOTE — Anesthesia Postprocedure Evaluation (Signed)
Anesthesia Post Note  Patient: Derrick Mckenzie  Procedure(s) Performed: Procedure(s) (LRB): ESOPHAGOGASTRODUODENOSCOPY (EGD) WITH PROPOFOL (N/A)  Patient location during evaluation: PACU Anesthesia Type: MAC Level of consciousness: awake Pain management: pain level controlled Vital Signs Assessment: post-procedure vital signs reviewed and stable Respiratory status: spontaneous breathing Cardiovascular status: blood pressure returned to baseline Postop Assessment: no headache Anesthetic complications: no    Jaci Standard, III,  Diahann Guajardo D

## 2016-10-05 NOTE — Transfer of Care (Signed)
Immediate Anesthesia Transfer of Care Note  Patient: Derrick Mckenzie  Procedure(s) Performed: Procedure(s) with comments: ESOPHAGOGASTRODUODENOSCOPY (EGD) WITH PROPOFOL (N/A) - sleep apnea  Patient Location: PACU  Anesthesia Type: MAC  Level of Consciousness: awake, alert  and patient cooperative  Airway and Oxygen Therapy: Patient Spontanous Breathing and Patient connected to supplemental oxygen  Post-op Assessment: Post-op Vital signs reviewed, Patient's Cardiovascular Status Stable, Respiratory Function Stable, Patent Airway and No signs of Nausea or vomiting  Post-op Vital Signs: Reviewed and stable  Complications: No apparent anesthesia complications

## 2016-10-05 NOTE — Anesthesia Preprocedure Evaluation (Signed)
Anesthesia Evaluation  Patient identified by MRN, date of birth, ID band Patient awake    Reviewed: Allergy & Precautions, H&P , NPO status   Airway Mallampati: III  TM Distance: >3 FB Neck ROM: full    Dental no notable dental hx.    Pulmonary former smoker,  Likely OSA by history but has never had a sleep study.   Pulmonary exam normal        Cardiovascular hypertension, On Medications Normal cardiovascular exam     Neuro/Psych    GI/Hepatic Medicated,  Endo/Other  Hypothyroidism   Renal/GU      Musculoskeletal   Abdominal   Peds  Hematology negative hematology ROS (+)   Anesthesia Other Findings   Reproductive/Obstetrics                             Anesthesia Physical Anesthesia Plan  ASA: II  Anesthesia Plan: MAC   Post-op Pain Management:    Induction:   Airway Management Planned:   Additional Equipment:   Intra-op Plan:   Post-operative Plan:   Informed Consent:   Plan Discussed with:   Anesthesia Plan Comments:         Anesthesia Quick Evaluation

## 2016-10-05 NOTE — Anesthesia Procedure Notes (Signed)
Procedure Name: MAC Performed by: Autrey Human Pre-anesthesia Checklist: Patient identified, Emergency Drugs available, Suction available, Timeout performed and Patient being monitored Patient Re-evaluated:Patient Re-evaluated prior to inductionOxygen Delivery Method: Nasal cannula Placement Confirmation: positive ETCO2       

## 2016-10-05 NOTE — Progress Notes (Signed)
Sucralfate refill provided

## 2016-10-05 NOTE — Assessment & Plan Note (Signed)
Refer again to gen surg

## 2016-10-05 NOTE — H&P (Signed)
Derrick Bellows MD [address] [place], [place] [address] Phone: [phone] Fax : [phone]  Primary Care Physician:  Enid Derry, MD Primary Gastroenterologist:  Dr. Jonathon Mckenzie   Pre-Procedure History & Physical: HPI:  Derrick Mckenzie is a 65 y.o. male is here for an endoscopy.   Past Medical History:  Diagnosis Date  . Arthritis    knees  . Complication of anesthesia    aggitation after knee surgery  . Family history of adverse reaction to anesthesia    brother - hallucinations  . GERD (gastroesophageal reflux disease)   . Hx of smoking 06/26/2015  . Hyperlipidemia   . Hypertension   . Hypothyroidism   . Kidney stone   . Morbid obesity ([place])   . Sleep apnea    score of 5 on apnea screen    Past Surgical History:  Procedure Laterality Date  . HERNIA REPAIR    . REPLACEMENT TOTAL KNEE Right 2017  . TOTAL KNEE ARTHROPLASTY  1973    Prior to Admission medications   Medication Sig Start Date End Date Taking? Authorizing Provider  amLODipine (NORVASC) 10 MG tablet Take 1 tablet (10 mg total) by mouth daily. 12/21/15  Yes Arnetha Courser, MD  hydrochlorothiazide (HYDRODIURIL) 25 MG tablet Take 0.5 tablets (12.5 mg total) by mouth daily. 12/21/15  Yes Arnetha Courser, MD  Krill Oil 1000 MG CAPS Take by mouth.   Yes Historical Provider, MD  levothyroxine (SYNTHROID, LEVOTHROID) 75 MCG tablet Take 1 tablet (75 mcg total) by mouth daily. 09/27/16  Yes Arnetha Courser, MD  pantoprazole (PROTONIX) 40 MG tablet Take 1 tablet (40 mg total) by mouth daily. 09/27/16  Yes Arnetha Courser, MD  sucralfate (CARAFATE) 1 g tablet Take 1 tablet (1 g total) by mouth 4 (four) times daily -  with meals and at bedtime. Patient not taking: Reported on 10/04/2016 09/27/16   Arnetha Courser, MD    Allergies as of 10/04/2016  . (No Known Allergies)    Family History  Problem Relation Age of Onset  . Cancer Mother     leukemia  . Diabetes Brother     Social History   Social  History  . Marital status: Married    Spouse name: N/A  . Number of children: N/A  . Years of education: N/A   Occupational History  . Not on file.   Social History Main Topics  . Smoking status: Former Smoker    Packs/day: 0.00    Years: 10.00    Types: Cigarettes    Quit date: 02/03/2016  . Smokeless tobacco: Never Used  . Alcohol use 0.0 oz/week     Comment: socially - Holidays  . Drug use: No  . Sexual activity: Yes   Other Topics Concern  . Not on file   Social History Narrative  . No narrative on file    Review of Systems: See HPI, otherwise negative ROS  Physical Exam: BP 124/74   Pulse 68   Temp 97.3 F (36.3 C) (Temporal)   Resp 16   Ht 5\' 9"  (1.753 m)   Wt 296 lb (134.3 kg)   SpO2 96%   BMI 43.71 kg/m  General:   Alert,  pleasant and cooperative in NAD Head:  Normocephalic and atraumatic. Neck:  Supple; no masses or thyromegaly. Lungs:  Clear throughout to auscultation.    Heart:  Regular rate and rhythm. Abdomen:  Soft, nontender and nondistended. Normal bowel sounds, without guarding, and  without rebound.   Neurologic:  Alert and  oriented x4;  grossly normal neurologically.  Impression/Plan: Derrick Mckenzie is here for an endoscopy to be performed for abdominal pain  Risks, benefits, limitations, and alternatives regarding  endoscopy have been reviewed with the patient.  Questions have been answered.  All parties agreeable.   Derrick Bellows, MD  10/05/2016, 8:35 AM

## 2016-10-06 ENCOUNTER — Telehealth: Payer: Self-pay | Admitting: Family Medicine

## 2016-10-06 ENCOUNTER — Encounter: Payer: Self-pay | Admitting: *Deleted

## 2016-10-06 MED ORDER — SUCRALFATE 1 G PO TABS: 1.0000 g | ORAL_TABLET | Freq: Three times a day (TID) | ORAL | 0 refills | Status: DC

## 2016-10-06 NOTE — Telephone Encounter (Signed)
Derrick Mckenzie from [facility]: need clarification on Sucralfate. She states that this medication is typically taken before meal. (P) [id number] Reference #: [id number]

## 2016-10-06 NOTE — Telephone Encounter (Signed)
[employer] notified

## 2016-10-06 NOTE — Telephone Encounter (Signed)
Yes, we agree

## 2016-10-07 ENCOUNTER — Other Ambulatory Visit
Admission: RE | Admit: 2016-10-07 | Discharge: 2016-10-07 | Disposition: A | Discharge status: Home / Self Care | Payer: Commercial Managed Care - HMO | Source: Ambulatory Visit | Attending: Urology | Admitting: Urology

## 2016-10-07 ENCOUNTER — Ambulatory Visit (INDEPENDENT_AMBULATORY_CARE_PROVIDER_SITE_OTHER): Payer: Commercial Managed Care - HMO | Admitting: Urology

## 2016-10-07 ENCOUNTER — Encounter: Payer: Self-pay | Admitting: Urology

## 2016-10-07 ENCOUNTER — Other Ambulatory Visit: Payer: Self-pay | Admitting: Family Medicine

## 2016-10-07 VITALS — BP 122/75 | HR 91 | Ht 69.0 in | Wt 296.0 lb

## 2016-10-07 DIAGNOSIS — N200 Calculus of kidney: Secondary | ICD-10-CM

## 2016-10-07 DIAGNOSIS — R351 Nocturia: Secondary | ICD-10-CM

## 2016-10-07 LAB — URINALYSIS COMPLETE WITH MICROSCOPIC (ARMC ONLY)
Bacteria, UA: NONE SEEN
Bilirubin Urine: NEGATIVE
Glucose, UA: NEGATIVE mg/dL
Hgb urine dipstick: NEGATIVE
Leukocytes, UA: NEGATIVE
Nitrite: NEGATIVE
Protein, ur: NEGATIVE mg/dL
RBC / HPF: NONE SEEN RBC/hpf (ref 0–5)
Specific Gravity, Urine: 1.02 (ref 1.005–1.030)
Squamous Epithelial / LPF: NONE SEEN
pH: 6 (ref 5.0–8.0)

## 2016-10-07 NOTE — Patient Instructions (Signed)
Dietary Guidelines to Help Prevent Kidney Stones Your risk of kidney stones can be decreased by adjusting the foods you eat. The most important thing you can do is drink enough fluid. You should drink enough fluid to keep your urine clear or pale yellow. The following guidelines provide specific information for the type of kidney stone you have had. GUIDELINES ACCORDING TO TYPE OF KIDNEY STONE Calcium Oxalate Kidney Stones  Reduce the amount of salt you eat. Foods that have a lot of salt cause your body to release excess calcium into your urine. The excess calcium can combine with a substance called oxalate to form kidney stones.  Reduce the amount of animal protein you eat if the amount you eat is excessive. Animal protein causes your body to release excess calcium into your urine. Ask your dietitian how much protein from animal sources you should be eating.  Avoid foods that are high in oxalates. If you take vitamins, they should have less than 500 mg of vitamin C. Your body turns vitamin C into oxalates. You do not need to avoid fruits and vegetables high in vitamin C. Calcium Phosphate Kidney Stones  Reduce the amount of salt you eat to help prevent the release of excess calcium into your urine.  Reduce the amount of animal protein you eat if the amount you eat is excessive. Animal protein causes your body to release excess calcium into your urine. Ask your dietitian how much protein from animal sources you should be eating.  Get enough calcium from food or take a calcium supplement (ask your dietitian for recommendations). Food sources of calcium that do not increase your risk of kidney stones include:  Broccoli.  Dairy products, such as cheese and yogurt.  Pudding. Uric Acid Kidney Stones  Do not have more than 6 oz of animal protein per day. FOOD SOURCES Animal Protein Sources  Meat (all types).  Poultry.  Eggs.  Fish, seafood. Foods High in Salt  Salt seasonings.  Soy  sauce.  Teriyaki sauce.  Cured and processed meats.  Salted crackers and snack foods.  Fast food.  Canned soups and most canned foods. Foods High in Oxalates  Grains:  Amaranth.  Barley.  Grits.  Wheat germ.  Bran.  Buckwheat flour.  All bran cereals.  Pretzels.  Whole wheat bread.  Vegetables:  Beans (wax).  Beets and beet greens.  Collard greens.  Eggplant.  Escarole.  Leeks.  Okra.  Parsley.  Rutabagas.  Spinach.  Swiss chard.  Tomato paste.  Fried potatoes.  Sweet potatoes.  Fruits:  Red currants.  Figs.  Kiwi.  Rhubarb.  Meat and Other Protein Sources:  Beans (dried).  Soy burgers and other soybean products.  Miso.  Nuts (peanuts, almonds, pecans, cashews, hazelnuts).  Nut butters.  Sesame seeds and tahini (paste made of sesame seeds).  Poppy seeds.  Beverages:  Chocolate drink mixes.  Soy milk.  Instant iced tea.  Juices made from high-oxalate fruits or vegetables.  Other:  Carob.  Chocolate.  Fruitcake.  Marmalades.   This information is not intended to replace advice given to you by your health care provider. Make sure you discuss any questions you have with your health care provider.   Document Released: 03/11/2011 Document Revised: 11/19/2013 Document Reviewed: 10/11/2013 Elsevier Interactive Patient Education 2016 Elsevier Inc.  

## 2016-10-07 NOTE — Progress Notes (Signed)
10/07/2016 2:28 PM   Livia Snellen [place] 04-29-1951 [id number]  Referring provider: Arnetha Courser, MD [address] [place], [place] [address]  Chief Complaint  Patient presents with  . Nephrolithiasis    HPI: Pleasant 65 year old male who presents today for further evaluation of a 6 mm right nonobstructing kidney stone. This was incidentally identified on CT abdomen and pelvis obtained for further workup of epigastric pain. He's been since diagnosed with a gastric ulcer on endoscopy.  Patient denies any history of kidney stones or flank pain. No dysuria, urinary tract infections, gross hematuria.  His only urinary complaint today's nocturia. He does snore and will be undergoing a sleep study in the near future.  He does have a personal history of gout.   PMH: Past Medical History:  Diagnosis Date  . Arthritis    knees  . Complication of anesthesia    aggitation after knee surgery  . Family history of adverse reaction to anesthesia    brother - hallucinations  . GERD (gastroesophageal reflux disease)   . Hx of smoking 06/26/2015  . Hyperlipidemia   . Hypertension   . Hypothyroidism   . Kidney stone   . Morbid obesity ([place])   . Sleep apnea    score of 5 on apnea screen    Surgical History: Past Surgical History:  Procedure Laterality Date  . ESOPHAGOGASTRODUODENOSCOPY (EGD) WITH PROPOFOL N/A 10/05/2016   Procedure: ESOPHAGOGASTRODUODENOSCOPY (EGD) WITH PROPOFOL;  Surgeon: Jonathon Bellows, MD;  Location: [facility];  Service: Endoscopy;  Laterality: N/A;  sleep apnea  . HERNIA REPAIR    . REPLACEMENT TOTAL KNEE Right 2017  . TOTAL KNEE ARTHROPLASTY  1973    Home Medications:    Medication List       Accurate as of 10/07/16  2:28 PM. Always use your most recent med list.          amLODipine 10 MG tablet Commonly known as:  NORVASC Take 1 tablet (10 mg total) by mouth daily.   hydrochlorothiazide 25 MG tablet Commonly known as:   HYDRODIURIL Take 0.5 tablets (12.5 mg total) by mouth daily.   Krill Oil 1000 MG Caps Take by mouth.   levothyroxine 75 MCG tablet Commonly known as:  SYNTHROID, LEVOTHROID Take 1 tablet (75 mcg total) by mouth daily.   pantoprazole 40 MG tablet Commonly known as:  PROTONIX Take 1 tablet (40 mg total) by mouth daily.   sucralfate 1 g tablet Commonly known as:  CARAFATE Take 1 tablet (1 g total) by mouth 4 (four) times daily -  before meals and at bedtime.       Allergies: No Known Allergies  Family History: Family History  Problem Relation Age of Onset  . Cancer Mother     leukemia  . Diabetes Brother   . Prostate cancer Neg Hx   . Bladder Cancer Neg Hx   . Kidney cancer Neg Hx     Social History:  reports that he quit smoking about 8 months ago. His smoking use included Cigarettes. He smoked 0.00 packs per day for 10.00 years. He has never used smokeless tobacco. He reports that he drinks alcohol. He reports that he does not use drugs.  ROS: UROLOGY Frequent Urination?: No Hard to postpone urination?: No Burning/pain with urination?: No Get up at night to urinate?: No Leakage of urine?: No Urine stream starts and stops?: No Trouble starting stream?: No Do you have to strain to urinate?: No Blood in urine?:  No Urinary tract infection?: No Sexually transmitted disease?: No Injury to kidneys or bladder?: No Painful intercourse?: No Weak stream?: No Erection problems?: No Penile pain?: No  Gastrointestinal Nausea?: No Vomiting?: No Indigestion/heartburn?: No Diarrhea?: No Constipation?: No  Constitutional Fever: No Night sweats?: No Weight loss?: No Fatigue?: No  Skin Skin rash/lesions?: No Itching?: No  Eyes Blurred vision?: No Double vision?: No  Ears/Nose/Throat Sore throat?: No Sinus problems?: No  Hematologic/Lymphatic Swollen glands?: No Easy bruising?: No  Cardiovascular Leg swelling?: No Chest pain?: No  Respiratory Cough?:  No Shortness of breath?: No  Endocrine Excessive thirst?: No  Musculoskeletal Back pain?: No Joint pain?: No  Neurological Headaches?: No Dizziness?: No  Psychologic Depression?: No Anxiety?: No  Physical Exam: BP 122/75   Pulse 91   Ht 5\' 9"  (1.753 m)   Wt 296 lb (134.3 kg)   BMI 43.71 kg/m   Constitutional:  Alert and oriented, No acute distress.  Ambulating with cane.  Accompanied by his wife today. HEENT: Wall AT, moist mucus membranes.  Trachea midline, no masses. Cardiovascular: No clubbing, cyanosis, or edema. Respiratory: Normal respiratory effort, no increased work of breathing. GI: Abdomen is soft, nontender, nondistended, no abdominal masses. Obese.   GU: No CVA tenderness. Skin: No rashes, bruises or suspicious lesions. Neurologic: Grossly intact, no focal deficits, moving all 4 extremities. Psychiatric: Normal mood and affect.  Laboratory Data: Lab Results  Component Value Date   WBC 10.2 09/27/2016   HGB 13.4 09/27/2016   HCT 39.0 (L) 09/27/2016   MCV 85.8 09/27/2016   PLT 309 09/27/2016    Lab Results  Component Value Date   CREATININE 1.02 09/27/2016     Lab Results  Component Value Date   HGBA1C 5.8 (H) 08/05/2016    Urinalysis Component     Latest Ref Rng & Units 10/07/2016  Color, Urine     YELLOW YELLOW  Appearance     CLEAR CLEAR  Glucose     NEGATIVE mg/dL NEGATIVE  Bilirubin Urine     NEGATIVE NEGATIVE  Ketones, ur     NEGATIVE mg/dL TRACE (A)  Specific Gravity, Urine     1.005 - 1.030 1.020  Hgb urine dipstick     NEGATIVE NEGATIVE  pH     5.0 - 8.0 6.0  Protein     NEGATIVE mg/dL NEGATIVE  Nitrite     NEGATIVE NEGATIVE  Leukocytes, UA     NEGATIVE NEGATIVE  RBC / HPF     0 - 5 RBC/hpf NONE SEEN  WBC, UA     0 - 5 WBC/hpf 0-5  Bacteria, UA     NONE SEEN NONE SEEN  Squamous Epithelial / LPF     NONE SEEN NONE SEEN  Mucous      PRESENT  Hyaline Casts, UA      PRESENT  Granular Casts, UA      PRESENT    Pertinent Imaging: CLINICAL DATA:  Epigastric pain and nausea for 5 weeks, weight loss, a ileus vomiting,  EXAM: CT ABDOMEN WITH CONTRAST  TECHNIQUE: Multidetector CT imaging of the abdomen was performed using the standard protocol following bolus administration of intravenous contrast. Sagittal and coronal MPR images reconstructed from axial data set.  CONTRAST:  175mL ISOVUE-300 IOPAMIDOL (ISOVUE-300) INJECTION 61% IV. Dilute oral contrast.  COMPARISON:  None  FINDINGS: Lower chest: Lung bases clear  Hepatobiliary: 1.9 cm diameter calcified gallstone in lower gallbladder segment narrowing MAC. No gallbladder wall thickening. No biliary dilatation. Liver unremarkable.  Pancreas: Normal appearance  Spleen: Appears prominent size, 15.4 x 6.1 x 13.9 cm (volume = 680 cm^3). No focal abnormalities.  Adrenals/Urinary Tract: Adrenal glands normal. Nonobstructing 6 mm RIGHT renal calculus image 39. Kidneys and visualized ureters otherwise normal appearance.  Stomach/Bowel: Significant wall thickening of the distal gastric antrum, pylorus and proximal duodenal bulb with mild surrounding infiltrative changes most likely representing ulcer disease though tumor is not excluded with this appearance. No evidence of gastric outlet obstruction. Appendix normal. Remaining bowel loops normal appearance.  Vascular/Lymphatic: Minimal atherosclerotic calcification aorta and visualized coronary arteries. Multiple normal sized lymph nodes adjacent to pylorus/ duodenum with a minimally enlarged 12 mm short axis node adjacent to pancreatic head image 31. Scattered normal sized LEFT para-aortic nodes.  Other: No hernia, free air or free fluid.  No extraluminal gas.  Musculoskeletal: Unremarkable  IMPRESSION: Significant wall thickening involving the distal gastric antrum, pylorus and proximal duodenal bulb, with mild associated surrounding infiltrative  change.  Findings could represent ulcer disease though neoplasm is not excluded ; upper endoscopy assessment recommended.  Single minimally enlarged nonspecific adjacent lymph node 12 mm short axis.  Minimal aortic atherosclerosis and coronary arterial calcification.  Cholelithiasis.  Nonobstructing RIGHT renal calculus.  These results will be called to the ordering clinician or representative by the Radiologist Assistant, and communication documented in the PACS or zVision Dashboard.   Electronically Signed   By: Lavonia Dana M.D.   On: 09/27/2016 16:59  CT scan reviewed personally today and with the patient  Assessment & Plan:    1. Right kidney stone Incidental 6 mm right mid pole nonobstructing calculus. Options discussed including conservative management, ureteroscopy, versus shockwave lithotripsy. We discussed that nonobstructing stones to the do not cause any pain or problems until they passed into the ureter and obstructs the flow of urine. Given the size of the stone, he has approximately 50-50 chance of passing the stone without intervention was then 30 day period. He has no previous renal imaging for review to assess how long this stone is been present and stable At this point time, he would like to defer any intervention. Commend KUB in 1 year to reassess size of stone. Stone prevention techniques discussed in detail including increasing hydration, avoidance of high salt diet, decrease animal protein, increase citrate, amongst others. A handout was given in addition to her discussion. - DG Abd 1 View; Future  2. Nocturia Behavioral modifications discussed Agree with sleep study. Discussed the pathophysiology of nocturia and how it relates to undiagnosed/untreated sleep apnea. Suspect his nighttime urinary symptoms will improve once using CPAP.   Return in about 1 year (around 10/07/2017) for KUB.  Hollice Espy, MD  [facility] [address] [place], [place] [address] [phone]

## 2016-10-10 DIAGNOSIS — K31811 Angiodysplasia of stomach and duodenum with bleeding: Secondary | ICD-10-CM | POA: Diagnosis not present

## 2016-10-10 NOTE — Addendum Note (Signed)
Addended by: LADA, Satira Anis on: 10/10/2016 01:27 PM   Modules accepted: Orders

## 2016-10-10 NOTE — Assessment & Plan Note (Signed)
Refer to pulm for eval, patient would like home sleep study if possible

## 2016-10-10 NOTE — Telephone Encounter (Signed)
Derrick Mckenzie, please check with sleep specialists and see if anyone would be willing to let him do a home sleep study, interpret, and treat. Please see patient's MyChart note and respond back Thank you!

## 2016-10-13 ENCOUNTER — Ambulatory Visit: Payer: Self-pay | Admitting: Family Medicine

## 2016-10-14 ENCOUNTER — Ambulatory Visit (INDEPENDENT_AMBULATORY_CARE_PROVIDER_SITE_OTHER): Payer: Commercial Managed Care - HMO | Admitting: Family Medicine

## 2016-10-14 ENCOUNTER — Encounter: Payer: Self-pay | Admitting: Family Medicine

## 2016-10-14 DIAGNOSIS — I10 Essential (primary) hypertension: Secondary | ICD-10-CM

## 2016-10-14 DIAGNOSIS — R7303 Prediabetes: Secondary | ICD-10-CM | POA: Diagnosis not present

## 2016-10-14 DIAGNOSIS — K253 Acute gastric ulcer without hemorrhage or perforation: Secondary | ICD-10-CM | POA: Diagnosis not present

## 2016-10-14 DIAGNOSIS — K8020 Calculus of gallbladder without cholecystitis without obstruction: Secondary | ICD-10-CM

## 2016-10-14 DIAGNOSIS — Z1159 Encounter for screening for other viral diseases: Secondary | ICD-10-CM | POA: Diagnosis not present

## 2016-10-14 HISTORY — DX: Prediabetes: R73.03

## 2016-10-14 NOTE — Assessment & Plan Note (Signed)
Continue to work on weight loss 

## 2016-10-14 NOTE — Progress Notes (Signed)
BP 128/78 (BP Location: Left Arm)   Pulse 85   Temp 98.1 F (36.7 C)   Resp 16   Ht 5\' 9"  (1.753 m)   Wt (!) 302 lb (137 kg)   SpO2 96%   BMI 44.60 kg/m    Subjective:    Patient ID: Derrick Mckenzie, male    DOB: March 14, 1951, 65 y.o.   MRN: [id number]  HPI: Derrick Mckenzie is a 65 y.o. male  Chief Complaint  Patient presents with  . Follow-up    Labs    Patient had the EGD, has an ulcer; he had a polypectomy from the esophagus, full ulcer, not actively bleeding; taking pantoprazole and sucralfate; going back in 6 weeks for a repeat EGD with GI  He saw the urologist for a solitary 6 mm kidney stone dsicovered on CT; no plans to remove stone; going to try to stay well-hydrated; will get another KUB in one year; adding lemon to water to help  High TG; not doing krill oil; he asked GI and said stay off for now; no krill oil x months  Prediabetes; drinking water; last A1c 5.8; no soft drinks, no sweet tea; no white bread; doing whole grains and rye; brother has diabetes, lost >100 pounds; he was 400+ pounds, and lost lots of weight; diabetes now controlled  Depression screen [facility] 2/9 10/14/2016 09/27/2016 07/06/2016 04/12/2016 06/26/2015  Decreased Interest 0 0 0 0 0  Down, Depressed, Hopeless 0 0 0 0 0  PHQ - 2 Score 0 0 0 0 0   Relevant past medical, surgical, family and social history reviewed Past Medical History:  Diagnosis Date  . Arthritis    knees  . Complication of anesthesia    aggitation after knee surgery  . Family history of adverse reaction to anesthesia    brother - hallucinations  . GERD (gastroesophageal reflux disease)   . Hx of smoking 06/26/2015  . Hyperlipidemia   . Hypertension   . Hypothyroidism   . Kidney stone   . Morbid obesity ([place])   . Prediabetes 10/14/2016  . Sleep apnea    score of 5 on apnea screen   Past Surgical History:  Procedure Laterality Date  . ESOPHAGOGASTRODUODENOSCOPY (EGD) WITH PROPOFOL N/A 10/05/2016   Procedure:  ESOPHAGOGASTRODUODENOSCOPY (EGD) WITH PROPOFOL;  Surgeon: Jonathon Bellows, MD;  Location: [facility];  Service: Endoscopy;  Laterality: N/A;  sleep apnea  . HERNIA REPAIR    . REPLACEMENT TOTAL KNEE Right 2017  . TOTAL KNEE ARTHROPLASTY  1973   Family History  Problem Relation Age of Onset  . Cancer Mother     leukemia  . Diabetes Brother   . Prostate cancer Neg Hx   . Bladder Cancer Neg Hx   . Kidney cancer Neg Hx    Social History  Substance Use Topics  . Smoking status: Former Smoker    Packs/day: 0.00    Years: 10.00    Types: Cigarettes    Quit date: 02/03/2016  . Smokeless tobacco: Never Used  . Alcohol use 0.0 oz/week     Comment: socially - Holidays   Interim medical history since last visit reviewed. Allergies and medications reviewed  Review of Systems Per HPI unless specifically indicated above     Objective:    BP 128/78 (BP Location: Left Arm)   Pulse 85   Temp 98.1 F (36.7 C)   Resp 16   Ht 5\' 9"  (1.753 m)   Wt (!) 302 lb (  137 kg)   SpO2 96%   BMI 44.60 kg/m   Wt Readings from Last 3 Encounters:  10/14/16 (!) 302 lb (137 kg)  10/07/16 296 lb (134.3 kg)  10/05/16 296 lb (134.3 kg)    Physical Exam  Constitutional: He appears well-developed and well-nourished. No distress.  Morbidly obese  HENT:  Head: Normocephalic and atraumatic.  Eyes: EOM are normal. No scleral icterus.  Neck: No thyromegaly present.  Cardiovascular: Normal rate and regular rhythm.   Pulmonary/Chest: Effort normal and breath sounds normal.  Abdominal: Soft. Bowel sounds are normal. He exhibits no distension.  Musculoskeletal: He exhibits no edema.  Walks with single prong cane  Neurological: He is alert.  Skin: Skin is warm and dry. No pallor.  Psychiatric: He has a normal mood and affect. His behavior is normal. Judgment and thought content normal.   Results for orders placed or performed during the hospital encounter of 10/07/16  Urinalysis complete, with  microscopic (For [facility] ONLY)  Result Value Ref Range   Color, Urine YELLOW YELLOW   APPearance CLEAR CLEAR   Glucose, UA NEGATIVE NEGATIVE mg/dL   Bilirubin Urine NEGATIVE NEGATIVE   Ketones, ur TRACE (A) NEGATIVE mg/dL   Specific Gravity, Urine 1.020 1.005 - 1.030   Hgb urine dipstick NEGATIVE NEGATIVE   pH 6.0 5.0 - 8.0   Protein, ur NEGATIVE NEGATIVE mg/dL   Nitrite NEGATIVE NEGATIVE   Leukocytes, UA NEGATIVE NEGATIVE   RBC / HPF NONE SEEN 0 - 5 RBC/hpf   WBC, UA 0-5 0 - 5 WBC/hpf   Bacteria, UA NONE SEEN NONE SEEN   Squamous Epithelial / LPF NONE SEEN NONE SEEN   Mucous PRESENT    Hyaline Casts, UA PRESENT    Granular Casts, UA PRESENT       Assessment & Plan:   Problem List Items Addressed This Visit      Cardiovascular and Mediastinum   Hypertension (Chronic)    Controlled today        Digestive   Peptic ulcer of stomach    Continue medicines; avoid triggers; f/u with Dr. Wilhemena Durie 6 weeks later for EGD      Cholelithiasis    Noted on CT scan; going to see surgeon on Tuesday; asymptomatic, but avoiding greasy foods        Other   Prediabetes (Chronic)    Offered referral to diabetic education; work on weight loss; check A1c in March 2018      Relevant Orders   Ambulatory referral to diabetic education   Need for hepatitis C screening test    Check that in March with other labs      Relevant Orders   Hepatitis C Antibody   Morbid obesity ([place]) (Chronic)    Continue to work on weigh tloss         Follow up plan: Return in about 4 months (around 02/03/2017) for fasting labs, visit with Dr. Sanda Klein.  An after-visit summary was printed and given to the patient at [facility].  Please see the patient instructions which may contain other information and recommendations beyond what is mentioned above in the assessment and plan.  No orders of the defined types were placed in this encounter.   Orders Placed This Encounter  Procedures  . Hepatitis C Antibody    . Ambulatory referral to diabetic education

## 2016-10-14 NOTE — Patient Instructions (Signed)
Try to limit sweets, white bread, white rice, white potatoes We'll have you see the diabetic educator Try to limit saturated fats in your diet (bologna, hot dogs, barbeque, cheeseburgers, hamburgers, steak, bacon, sausage, cheese, etc.) and get more fresh fruits, vegetables, and whole grains Try to limit or avoid triggers like coffee, caffeinated beverages, onions, chocolate, spicy foods, peppermint, acid foods like pizza, spaghetti sauce, and orange juice Lose weight if you are overweight or obese Try elevating the head of your bed by placing a small wedge between your mattress and box springs to keep acid in the stomach at night instead of coming up into your esophagus

## 2016-10-14 NOTE — Assessment & Plan Note (Signed)
Check that in March with other labs

## 2016-10-14 NOTE — Assessment & Plan Note (Signed)
Continue medicines; avoid triggers; f/u with Dr. Wilhemena Durie 6 weeks later for EGD

## 2016-10-14 NOTE — Assessment & Plan Note (Signed)
Offered referral to diabetic education; work on weight loss; check A1c in March 2018

## 2016-10-14 NOTE — Assessment & Plan Note (Signed)
Noted on CT scan; going to see surgeon on Tuesday; asymptomatic, but avoiding greasy foods

## 2016-10-14 NOTE — Assessment & Plan Note (Signed)
Controlled today 

## 2016-10-18 ENCOUNTER — Encounter: Payer: Self-pay | Admitting: General Surgery

## 2016-10-18 ENCOUNTER — Ambulatory Visit (INDEPENDENT_AMBULATORY_CARE_PROVIDER_SITE_OTHER): Payer: Commercial Managed Care - HMO | Admitting: General Surgery

## 2016-10-18 ENCOUNTER — Encounter: Payer: Self-pay | Admitting: Gastroenterology

## 2016-10-18 VITALS — BP 114/64 | HR 88 | Resp 12 | Ht 69.0 in | Wt 299.0 lb

## 2016-10-18 DIAGNOSIS — K8020 Calculus of gallbladder without cholecystitis without obstruction: Secondary | ICD-10-CM | POA: Diagnosis not present

## 2016-10-18 NOTE — Patient Instructions (Addendum)
The patient is aware to call back for any questions or concerns. Follow up as needed or for worsening symptoms or new concerns.   Cholelithiasis Cholelithiasis is also called "gallstones." It is a kind of gallbladder disease. The gallbladder is an organ that stores a liquid (bile) that helps you digest fat. Gallstones may not cause symptoms (may be silent gallstones) until they cause a blockage, and then they can cause pain (gallbladder attack). Follow these instructions at home:  Take over-the-counter and prescription medicines only as told by your doctor.  Stay at a healthy weight.  Eat healthy foods. This includes:  Eating fewer fatty foods, like fried foods.  Eating fewer refined carbs (refined carbohydrates). Refined carbs are breads and grains that are highly processed, like white bread and white rice. Instead, choose whole grains like whole-wheat bread and brown rice.  Eating more fiber. Almonds, fresh fruit, and beans are healthy sources of fiber.  Keep all follow-up visits as told by your doctor. This is important. Contact a doctor if:  You have sudden pain in the upper right side of your belly (abdomen). Pain might spread to your right shoulder or your chest. This may be a sign of a gallbladder attack.  You feel sick to your stomach (are nauseous).  You throw up (vomit).  You have been diagnosed with gallstones that have no symptoms and you get:  Belly pain.  Discomfort, burning, or fullness in the upper part of your belly (indigestion). Get help right away if:  You have sudden pain in the upper right side of your belly, and it lasts for more than 2 hours.  You have belly pain that lasts for more than 5 hours.  You have a fever or chills.  You keep feeling sick to your stomach or you keep throwing up.  Your skin or the whites of your eyes turn yellow (jaundice).  You have dark-colored pee (urine).  You have light-colored poop  (stool). Summary  Cholelithiasis is also called "gallstones."  The gallbladder is an organ that stores a liquid (bile) that helps you digest fat.  Silent gallstones are gallstones that do not cause symptoms.  A gallbladder attack may cause sudden pain in the upper right side of your belly. Pain might spread to your right shoulder or your chest. If this happens, contact your doctor.  If you have sudden pain in the upper right side of your belly that lasts for more than 2 hours, get help right away. This information is not intended to replace advice given to you by your health care provider. Make sure you discuss any questions you have with your health care provider. Document Released: 05/02/2008 Document Revised: 07/31/2016 Document Reviewed: 07/31/2016 Elsevier Interactive Patient Education  2017 [employer].

## 2016-10-18 NOTE — Progress Notes (Signed)
Patient ID: Derrick Mckenzie, male   DOB: 09-30-1951, 65 y.o.   MRN: [id number]  Chief Complaint  Patient presents with  . Abdominal Pain    HPI Derrick Mckenzie is a 65 y.o. male. Here today for evaluation of gall stones seen on CT scan. He states he had abdominal pain with nausea and vomiting that seemed to have started the last part of October which he was thinking it was his pain medication from recent knee surgery. CT abdomen was 09-27-16. He states he also has kidney stones that is not causing him any problems and ulcer that he is taking Carafate and Protonix to help. He states he is not having any further pain. His appetite is back and no further nausea.  He is traveling November 12 2016 to [place] for the holidays to see his daughter and grandson (that he has never met).  HPI  Past Medical History:  Diagnosis Date  . Arthritis    knees  . Complication of anesthesia    aggitation after knee surgery  . Family history of adverse reaction to anesthesia    brother - hallucinations  . GERD (gastroesophageal reflux disease)   . Hx of smoking 06/26/2015  . Hyperlipidemia   . Hypertension   . Hypothyroidism   . Kidney stone   . Morbid obesity ([name])   . Murmur   . Prediabetes 10/14/2016  . Sleep apnea    score of 5 on apnea screen    Past Surgical History:  Procedure Laterality Date  . ESOPHAGOGASTRODUODENOSCOPY (EGD) WITH PROPOFOL N/A 10/05/2016   Procedure: ESOPHAGOGASTRODUODENOSCOPY (EGD) WITH PROPOFOL;  Surgeon: Jonathon Bellows, MD;  Location: [facility];  Service: Endoscopy;  Laterality: N/A;  sleep apnea  . HERNIA REPAIR    . REPLACEMENT TOTAL KNEE Right 2017  . TOTAL KNEE ARTHROPLASTY  1973    Family History  Problem Relation Age of Onset  . Cancer Mother     leukemia  . Diabetes Brother   . Prostate cancer Neg Hx   . Bladder Cancer Neg Hx   . Kidney cancer Neg Hx     Social History Social History  Substance Use Topics  . Smoking status: Former  Smoker    Packs/day: 0.00    Years: 10.00    Types: Cigarettes    Quit date: 02/03/2016  . Smokeless tobacco: Never Used  . Alcohol use 0.0 oz/week     Comment: socially - Holidays    No Known Allergies  Current Outpatient Prescriptions  Medication Sig Dispense Refill  . amLODipine (NORVASC) 10 MG tablet Take 1 tablet (10 mg total) by mouth daily. 14 tablet 0  . hydrochlorothiazide (HYDRODIURIL) 25 MG tablet Take 0.5 tablets (12.5 mg total) by mouth daily. 7 tablet 0  . Krill Oil 1000 MG CAPS Take by mouth.    . levothyroxine (SYNTHROID, LEVOTHROID) 75 MCG tablet Take 1 tablet (75 mcg total) by mouth daily. 90 tablet 0  . pantoprazole (PROTONIX) 40 MG tablet Take 1 tablet (40 mg total) by mouth daily. 30 tablet 0  . sucralfate (CARAFATE) 1 g tablet Take 1 tablet (1 g total) by mouth 4 (four) times daily -  before meals and at bedtime. 120 tablet 0   No current facility-administered medications for this visit.     Review of Systems Review of Systems  Constitutional: Negative.   Respiratory: Negative.   Cardiovascular: Negative.     Blood pressure 114/64, pulse 88, resp. rate 12, height _0  (  1.753 m), weight 299 lb (135.6 kg).  Physical Exam Physical Exam  Constitutional: He is oriented to person, place, and time. He appears well-developed and well-nourished.  HENT:  Mouth/Throat: Oropharynx is clear and moist.  Eyes: Conjunctivae are normal. No scleral icterus.  Neck: Neck supple.  Cardiovascular: Normal rate and regular rhythm.   Murmur heard. Pulmonary/Chest: Effort normal and breath sounds normal.  Abdominal: Soft. Normal appearance and bowel sounds are normal. There is no tenderness.  Lymphadenopathy:    He has no cervical adenopathy.  Neurological: He is alert and oriented to person, place, and time.  Skin: Skin is warm and dry.  Psychiatric: His behavior is normal.    Data Reviewed Images from the upper endoscopy dated 10/05/2016 were reviewed. 2 cm gastric  ulcer identified. Pathology showed chronic active gastritis without evidence of H. pylori.  Squamous papilloma of the esophagus.  CT of the abdomen and pelvis dated 09/27/2016 was reviewed. Calcified gallstone identified. No evidence of cholecystitis.  Assessment    Asymptomatic cholelithiasis.  Nondiabetic.    Plan    The patient has no history of dietary intolerance. Calcification indicates that the stone is long-standing, and is presently asymptomatic. No indication for intervention at this time.  The patient was encouraged to report promptly any right upper quadrant pain after meals, and the most common triggers for biliary discomfort or fatty foods and salads.  The patient will follow-up as previously scheduled with the endoscopist regarding his ulcer thought secondary to nonsteroidal use.     Follow up as needed or for worsening symptoms or new concerns. The patient is aware to call back for any questions or concerns.  This information has been scribed by Karie Fetch RN, BSN,BC.  Robert Bellow 10/19/2016, 7:03 AM

## 2016-10-19 ENCOUNTER — Encounter: Payer: Self-pay | Admitting: Family Medicine

## 2016-10-19 DIAGNOSIS — M25561 Pain in right knee: Secondary | ICD-10-CM | POA: Diagnosis not present

## 2016-10-19 DIAGNOSIS — G8929 Other chronic pain: Secondary | ICD-10-CM | POA: Diagnosis not present

## 2016-10-25 DIAGNOSIS — M1711 Unilateral primary osteoarthritis, right knee: Secondary | ICD-10-CM | POA: Diagnosis not present

## 2016-10-25 DIAGNOSIS — Z96651 Presence of right artificial knee joint: Secondary | ICD-10-CM | POA: Diagnosis not present

## 2016-10-28 ENCOUNTER — Telehealth: Payer: Self-pay

## 2016-10-28 ENCOUNTER — Other Ambulatory Visit: Payer: Self-pay

## 2016-10-28 NOTE — Telephone Encounter (Signed)
-----   Message from Jonathon Bellows, MD sent at 10/19/2016  3:04 PM EST ----- Gastritis with no H pylori, Squamous pappiloma of the esophagus. Repeat EGD per endoscopy note in 6 weeks to check for healing of the ulcer.

## 2016-10-28 NOTE — Telephone Encounter (Signed)
Pt notified of EGD results. Pt has been scheduled for repeat EGD at [facility] on 11/09/16. Instructs have been mailed.   Please precert for Gastric ulcer K25.9

## 2016-10-29 ENCOUNTER — Encounter: Payer: Self-pay | Admitting: Family Medicine

## 2016-11-01 NOTE — Telephone Encounter (Signed)
[employer] Josem Kaufmann has been faxed. Waiting on Approval

## 2016-11-03 ENCOUNTER — Encounter: Payer: Self-pay | Admitting: *Deleted

## 2016-11-03 ENCOUNTER — Institutional Professional Consult (permissible substitution): Payer: Commercial Managed Care - HMO | Admitting: Internal Medicine

## 2016-11-06 ENCOUNTER — Encounter: Payer: Self-pay | Admitting: Family Medicine

## 2016-11-08 ENCOUNTER — Other Ambulatory Visit: Payer: Self-pay | Admitting: Family Medicine

## 2016-11-08 NOTE — Telephone Encounter (Signed)
Please contact GI doctor's office for pantoprazole and carafate refills The GI doctor may want to adjust or change those medicines, so I'll let him direct management for now I'll refill the amlodipine and levothyroxine

## 2016-11-09 ENCOUNTER — Ambulatory Visit: Admit: 2016-11-09 | Payer: Commercial Managed Care - HMO | Admitting: Gastroenterology

## 2016-11-09 SURGERY — ESOPHAGOGASTRODUODENOSCOPY (EGD) WITH PROPOFOL
Anesthesia: Choice

## 2016-11-09 NOTE — Telephone Encounter (Signed)
Per [place], pre certification is not required. Contact [phone] if you have any questions

## 2016-11-09 NOTE — Telephone Encounter (Signed)
Patient notified

## 2016-12-25 DIAGNOSIS — Z96651 Presence of right artificial knee joint: Secondary | ICD-10-CM | POA: Diagnosis not present

## 2016-12-25 DIAGNOSIS — M1711 Unilateral primary osteoarthritis, right knee: Secondary | ICD-10-CM | POA: Diagnosis not present

## 2017-01-05 ENCOUNTER — Other Ambulatory Visit: Payer: Self-pay | Admitting: Family Medicine

## 2017-01-05 NOTE — Telephone Encounter (Signed)
Request received for Protonix and carafate I'd really like these medicines to go through the office of his gastroenterologist It's best if we have one cook in the kitchen; I denied the Rx I'll respectfully forward the request for pantoprazole and sucralfate to Dr. Vicente Males

## 2017-01-06 ENCOUNTER — Other Ambulatory Visit: Payer: Self-pay | Admitting: Gastroenterology

## 2017-01-06 DIAGNOSIS — K279 Peptic ulcer, site unspecified, unspecified as acute or chronic, without hemorrhage or perforation: Secondary | ICD-10-CM

## 2017-01-06 MED ORDER — SUCRALFATE 1 G PO TABS: 1.0000 g | ORAL_TABLET | Freq: Three times a day (TID) | ORAL | 0 refills | Status: DC

## 2017-01-06 MED ORDER — PANTOPRAZOLE SODIUM 40 MG PO TBEC: 40.0000 mg | DELAYED_RELEASE_TABLET | Freq: Every day | ORAL | 0 refills | Status: DC

## 2017-01-06 NOTE — Telephone Encounter (Signed)
I have reordered the meds  Dr Jonathon Bellows  Gastroenterology/Hepatology Pager: [phone]

## 2017-01-06 NOTE — Progress Notes (Signed)
PPI and carafate refilled   Dr Jonathon Bellows  Gastroenterology/Hepatology Pager: [phone]

## 2017-01-25 DIAGNOSIS — Z96651 Presence of right artificial knee joint: Secondary | ICD-10-CM | POA: Diagnosis not present

## 2017-01-25 DIAGNOSIS — M1711 Unilateral primary osteoarthritis, right knee: Secondary | ICD-10-CM | POA: Diagnosis not present

## 2017-02-03 ENCOUNTER — Ambulatory Visit: Payer: Commercial Managed Care - HMO | Admitting: Family Medicine

## 2017-02-13 ENCOUNTER — Ambulatory Visit (INDEPENDENT_AMBULATORY_CARE_PROVIDER_SITE_OTHER): Payer: Medicare HMO | Admitting: Family Medicine

## 2017-02-13 ENCOUNTER — Encounter: Payer: Self-pay | Admitting: Family Medicine

## 2017-02-13 ENCOUNTER — Other Ambulatory Visit: Payer: Self-pay | Admitting: Family Medicine

## 2017-02-13 VITALS — BP 118/68 | HR 90 | Temp 97.9°F | Resp 16 | Wt 300.0 lb

## 2017-02-13 DIAGNOSIS — Z96651 Presence of right artificial knee joint: Secondary | ICD-10-CM | POA: Diagnosis not present

## 2017-02-13 DIAGNOSIS — E782 Mixed hyperlipidemia: Secondary | ICD-10-CM

## 2017-02-13 DIAGNOSIS — Z5181 Encounter for therapeutic drug level monitoring: Secondary | ICD-10-CM

## 2017-02-13 DIAGNOSIS — R7303 Prediabetes: Secondary | ICD-10-CM

## 2017-02-13 DIAGNOSIS — I10 Essential (primary) hypertension: Secondary | ICD-10-CM | POA: Diagnosis not present

## 2017-02-13 DIAGNOSIS — E039 Hypothyroidism, unspecified: Secondary | ICD-10-CM

## 2017-02-13 DIAGNOSIS — K253 Acute gastric ulcer without hemorrhage or perforation: Secondary | ICD-10-CM | POA: Diagnosis not present

## 2017-02-13 DIAGNOSIS — Z23 Encounter for immunization: Secondary | ICD-10-CM

## 2017-02-13 DIAGNOSIS — Z1159 Encounter for screening for other viral diseases: Secondary | ICD-10-CM | POA: Diagnosis not present

## 2017-02-13 LAB — CBC WITH DIFFERENTIAL/PLATELET
Basophils Absolute: 80 cells/uL (ref 0–200)
Basophils Relative: 1 %
Eosinophils Absolute: 240 cells/uL (ref 15–500)
Eosinophils Relative: 3 %
HCT: 39.5 % (ref 38.5–50.0)
Hemoglobin: 13.6 g/dL (ref 13.2–17.1)
Lymphocytes Relative: 17 %
Lymphs Abs: 1360 cells/uL (ref 850–3900)
MCH: 29.6 pg (ref 27.0–33.0)
MCHC: 34.4 g/dL (ref 32.0–36.0)
MCV: 86.1 fL (ref 80.0–100.0)
MPV: 8.9 fL (ref 7.5–12.5)
Monocytes Absolute: 560 cells/uL (ref 200–950)
Monocytes Relative: 7 %
Neutro Abs: 5760 cells/uL (ref 1500–7800)
Neutrophils Relative %: 72 %
Platelets: 223 10*3/uL (ref 140–400)
RBC: 4.59 MIL/uL (ref 4.20–5.80)
RDW: 15.9 % — ABNORMAL HIGH (ref 11.0–15.0)
WBC: 8 10*3/uL (ref 3.8–10.8)

## 2017-02-13 LAB — COMPLETE METABOLIC PANEL WITH GFR
ALT: 10 U/L (ref 9–46)
AST: 14 U/L (ref 10–35)
Albumin: 4.4 g/dL (ref 3.6–5.1)
Alkaline Phosphatase: 61 U/L (ref 40–115)
BUN: 20 mg/dL (ref 7–25)
CO2: 28 mmol/L (ref 20–31)
Calcium: 9.9 mg/dL (ref 8.6–10.3)
Chloride: 101 mmol/L (ref 98–110)
Creat: 1.26 mg/dL — ABNORMAL HIGH (ref 0.70–1.25)
GFR, Est African American: 68 mL/min (ref >=60)
GFR, Est Non African American: 59 mL/min — ABNORMAL LOW (ref >=60)
Glucose, Bld: 122 mg/dL — ABNORMAL HIGH (ref 65–99)
Potassium: 4.2 mmol/L (ref 3.5–5.3)
Sodium: 140 mmol/L (ref 135–146)
Total Bilirubin: 0.5 mg/dL (ref 0.2–1.2)
Total Protein: 7.3 g/dL (ref 6.1–8.1)

## 2017-02-13 LAB — LIPID PANEL
Cholesterol: 171 mg/dL (ref <200)
HDL: 29 mg/dL — ABNORMAL LOW (ref >40)
LDL Cholesterol: 88 mg/dL (ref <100)
Total CHOL/HDL Ratio: 5.9 Ratio — ABNORMAL HIGH (ref <5.0)
Triglycerides: 269 mg/dL — ABNORMAL HIGH (ref <150)
VLDL: 54 mg/dL — ABNORMAL HIGH (ref <30)

## 2017-02-13 LAB — HEPATITIS C ANTIBODY: HCV Ab: NEGATIVE

## 2017-02-13 LAB — TSH: TSH: 6.64 mIU/L — ABNORMAL HIGH (ref 0.40–4.50)

## 2017-02-13 NOTE — Patient Instructions (Signed)
Check out the information at familydoctor.org entitled "Nutrition for Weight Loss: What You Need to Know about Fad Diets" Try to lose between 1-2 pounds per week by taking in fewer calories and burning off more calories You can succeed by limiting portions, limiting foods dense in calories and fat, becoming more active, and drinking 8 glasses of water a day (64 ounces) Don't skip meals, especially breakfast, as skipping meals may alter your metabolism Do not use over-the-counter weight loss pills or gimmicks that claim rapid weight loss A healthy BMI (or body mass index) is between 18.5 and 24.9 You can calculate your ideal BMI at the [place] website ClubMonetize.fr  Do please call Dr. Vicente Males about the next step to follow-up on your ulcer We'll contact you about the lab results done today

## 2017-02-13 NOTE — Assessment & Plan Note (Signed)
Patient has not been successful at weight loss

## 2017-02-13 NOTE — Assessment & Plan Note (Signed)
Check glucose and A1c 

## 2017-02-13 NOTE — Assessment & Plan Note (Signed)
Check fasting lipids; patient wants to be off of statin

## 2017-02-13 NOTE — Assessment & Plan Note (Signed)
Check electrolytes, liver

## 2017-02-13 NOTE — Assessment & Plan Note (Signed)
Controlled.  

## 2017-02-13 NOTE — Assessment & Plan Note (Signed)
Check TSH and he has been off of medicine

## 2017-02-13 NOTE — Assessment & Plan Note (Signed)
He'll call ortho

## 2017-02-13 NOTE — Progress Notes (Signed)
BP 118/68   Pulse 90   Temp 97.9 F (36.6 C) (Oral)   Resp 16   Wt 300 lb (136.1 kg)   SpO2 95%   BMI 44.30 kg/m    Subjective:    Patient ID: Derrick Mckenzie, male    DOB: [birth year], 66 y.o.   MRN: [id number]  HPI: Derrick Mckenzie is a 66 y.o. male  Chief Complaint  Patient presents with  . Follow-up   Since last visit, no medical excitement  He wants therapy on his knee; not performing it to perform after six months; clicking; not locking or popping; still swelling but no heat or redness; he'll do therapy; not much pain really; always feels like he is wearing a tight ace bandage  Obesity; no loss of weight since last visit  Prediabetes; not drinking much sugary drinks; crystal light or lemon water or water; some pasta; mostly pumpernickel rye combo bread  Hypothyroidism; has been taking his thyroid medicine  High cholesterol; quit the statin because of what he read  Hx of ulcer; has been off of PPI and carafate; patient never went back for second endoscopy; then he got some nausea like back when and then wife offered him pineapple, then had stomach problems and felt nauseated again; then a third time, all within the last 2 weeks; no blood in the stools or dark stools; has not gone back to aspirin  Depression screen [facility] 2/9 02/13/2017 10/14/2016 09/27/2016 07/06/2016 04/12/2016  Decreased Interest 0 0 0 0 0  Down, Depressed, Hopeless 0 0 0 0 0  PHQ - 2 Score 0 0 0 0 0   Relevant past medical, surgical, family and social history reviewed Past Medical History:  Diagnosis Date  . Arthritis    knees  . Complication of anesthesia    aggitation after knee surgery  . Family history of adverse reaction to anesthesia    brother - hallucinations  . GERD (gastroesophageal reflux disease)   . Hx of smoking 06/26/2015  . Hyperlipidemia   . Hypertension   . Hypothyroidism   . Kidney stone   . Morbid obesity ([place])   . Murmur   . Prediabetes 10/14/2016  . Sleep apnea    score of 5 on apnea screen   Past Surgical History:  Procedure Laterality Date  . ESOPHAGOGASTRODUODENOSCOPY (EGD) WITH PROPOFOL N/A 10/05/2016   Procedure: ESOPHAGOGASTRODUODENOSCOPY (EGD) WITH PROPOFOL;  Surgeon: Jonathon Bellows, MD;  Location: [facility];  Service: Endoscopy;  Laterality: N/A;  sleep apnea  . HERNIA REPAIR    . REPLACEMENT TOTAL KNEE Right 2017  . TOTAL KNEE ARTHROPLASTY  1973   Family History  Problem Relation Age of Onset  . Cancer Mother     leukemia  . Diabetes Brother   . Blindness Brother   . Heart attack Maternal Grandfather   . Prostate cancer Neg Hx   . Bladder Cancer Neg Hx   . Kidney cancer Neg Hx    Social History  Substance Use Topics  . Smoking status: Former Smoker    Packs/day: 0.00    Years: 10.00    Types: Cigarettes    Quit date: 02/03/2016  . Smokeless tobacco: Never Used  . Alcohol use 0.0 oz/week     Comment: socially - Holidays   Interim medical history since last visit reviewed. Allergies and medications reviewed  Review of Systems Per HPI unless specifically indicated above     Objective:    BP 118/68   Pulse  90   Temp 97.9 F (36.6 C) (Oral)   Resp 16   Wt 300 lb (136.1 kg)   SpO2 95%   BMI 44.30 kg/m   Wt Readings from Last 3 Encounters:  02/13/17 300 lb (136.1 kg)  10/18/16 299 lb (135.6 kg)  10/14/16 (!) 302 lb (137 kg)    Physical Exam  Constitutional: He appears well-developed and well-nourished. No distress.  Morbidly obese  HENT:  Head: Normocephalic and atraumatic.  Eyes: EOM are normal. No scleral icterus.  Neck: No thyromegaly present.  Cardiovascular: Normal rate and regular rhythm.   Pulmonary/Chest: Effort normal and breath sounds normal.  Abdominal: Soft. Bowel sounds are normal. He exhibits no distension.  Musculoskeletal: He exhibits no edema.  No assistive device for walking; well-healed surgical scar over right knee; clicking feeling with active flex/ext of right knee; no increased  warmth, no erythema over the knee  Neurological: He is alert.  Skin: Skin is warm and dry. No pallor.  Psychiatric: He has a normal mood and affect. His behavior is normal. Judgment and thought content normal. His mood appears not anxious. He does not exhibit a depressed mood.      Assessment & Plan:   Problem List Items Addressed This Visit      Cardiovascular and Mediastinum   Hypertension (Chronic)    Controlled        Digestive   Peptic ulcer of stomach    Check CBC to r/o occult bleeding; patient will contact Dr. Vicente Males for next step      Relevant Orders   CBC with Differential/Platelet     Endocrine   Hypothyroidism - Primary (Chronic)    Check TSH and he has been off of medicine      Relevant Orders   TSH     Other   Status post right knee replacement    He'll call ortho      Prediabetes (Chronic)    Check glucose and A1c      Relevant Orders   Hemoglobin A1c   Morbid obesity ([place]) (Chronic)    Patient has not been successful at weight loss      Medication monitoring encounter    Check electrolytes, liver      Relevant Orders   COMPLETE METABOLIC PANEL WITH GFR   Hyperlipidemia (Chronic)    Check fasting lipids; patient wants to be off of statin      Relevant Orders   Lipid panel    Other Visit Diagnoses    Need for diphtheria-tetanus-pertussis (Tdap) vaccine       Relevant Orders   Tdap vaccine greater than or equal to 7yo IM      Follow up plan: Return in about 6 months (around 08/24/2017) for follow-up and fasting labs; Medicare wellness visit when due.  An after-visit summary was printed and given to the patient at [place].  Please see the patient instructions which may contain other information and recommendations beyond what is mentioned above in the assessment and plan.  No orders of the defined types were placed in this encounter.   Orders Placed This Encounter  Procedures  . Tdap vaccine greater than or equal to 7yo IM  .  Lipid panel  . TSH  . COMPLETE METABOLIC PANEL WITH GFR  . CBC with Differential/Platelet  . Hemoglobin A1c

## 2017-02-13 NOTE — Assessment & Plan Note (Signed)
Check CBC to r/o occult bleeding; patient will contact Dr. Vicente Males for next step

## 2017-02-14 ENCOUNTER — Other Ambulatory Visit: Payer: Self-pay | Admitting: Family Medicine

## 2017-02-14 DIAGNOSIS — E039 Hypothyroidism, unspecified: Secondary | ICD-10-CM

## 2017-02-14 LAB — HEMOGLOBIN A1C
Hgb A1c MFr Bld: 5.6 % (ref <5.7)
Mean Plasma Glucose: 114 mg/dL

## 2017-02-14 MED ORDER — LEVOTHYROXINE SODIUM 88 MCG PO TABS: 88.0000 ug | ORAL_TABLET | Freq: Every day | ORAL | 2 refills | Status: DC

## 2017-02-14 NOTE — Progress Notes (Signed)
Increase synthroid from 75 to 88; patient told me yesterday he was taking it even though it was removed from the med list Recheck TSH in 6-8 weeks

## 2017-02-16 DIAGNOSIS — Z96651 Presence of right artificial knee joint: Secondary | ICD-10-CM | POA: Diagnosis not present

## 2017-02-16 DIAGNOSIS — M25561 Pain in right knee: Secondary | ICD-10-CM | POA: Diagnosis not present

## 2017-02-16 DIAGNOSIS — M6281 Muscle weakness (generalized): Secondary | ICD-10-CM | POA: Diagnosis not present

## 2017-02-16 DIAGNOSIS — M25661 Stiffness of right knee, not elsewhere classified: Secondary | ICD-10-CM | POA: Diagnosis not present

## 2017-02-22 DIAGNOSIS — Z96651 Presence of right artificial knee joint: Secondary | ICD-10-CM | POA: Diagnosis not present

## 2017-02-22 DIAGNOSIS — M1711 Unilateral primary osteoarthritis, right knee: Secondary | ICD-10-CM | POA: Diagnosis not present

## 2017-02-23 DIAGNOSIS — Z96651 Presence of right artificial knee joint: Secondary | ICD-10-CM | POA: Diagnosis not present

## 2017-02-23 DIAGNOSIS — M6281 Muscle weakness (generalized): Secondary | ICD-10-CM | POA: Diagnosis not present

## 2017-02-23 DIAGNOSIS — M25561 Pain in right knee: Secondary | ICD-10-CM | POA: Diagnosis not present

## 2017-02-23 DIAGNOSIS — M25661 Stiffness of right knee, not elsewhere classified: Secondary | ICD-10-CM | POA: Diagnosis not present

## 2017-02-28 DIAGNOSIS — M25561 Pain in right knee: Secondary | ICD-10-CM | POA: Diagnosis not present

## 2017-02-28 DIAGNOSIS — M25661 Stiffness of right knee, not elsewhere classified: Secondary | ICD-10-CM | POA: Diagnosis not present

## 2017-02-28 DIAGNOSIS — M6281 Muscle weakness (generalized): Secondary | ICD-10-CM | POA: Diagnosis not present

## 2017-02-28 DIAGNOSIS — Z96651 Presence of right artificial knee joint: Secondary | ICD-10-CM | POA: Diagnosis not present

## 2017-03-02 DIAGNOSIS — Z96651 Presence of right artificial knee joint: Secondary | ICD-10-CM | POA: Diagnosis not present

## 2017-03-02 DIAGNOSIS — M6281 Muscle weakness (generalized): Secondary | ICD-10-CM | POA: Diagnosis not present

## 2017-03-02 DIAGNOSIS — M25561 Pain in right knee: Secondary | ICD-10-CM | POA: Diagnosis not present

## 2017-03-02 DIAGNOSIS — M25661 Stiffness of right knee, not elsewhere classified: Secondary | ICD-10-CM | POA: Diagnosis not present

## 2017-03-07 DIAGNOSIS — M6281 Muscle weakness (generalized): Secondary | ICD-10-CM | POA: Diagnosis not present

## 2017-03-07 DIAGNOSIS — Z96651 Presence of right artificial knee joint: Secondary | ICD-10-CM | POA: Diagnosis not present

## 2017-03-09 DIAGNOSIS — Z96651 Presence of right artificial knee joint: Secondary | ICD-10-CM | POA: Diagnosis not present

## 2017-03-09 DIAGNOSIS — M25661 Stiffness of right knee, not elsewhere classified: Secondary | ICD-10-CM | POA: Diagnosis not present

## 2017-03-09 DIAGNOSIS — M25561 Pain in right knee: Secondary | ICD-10-CM | POA: Diagnosis not present

## 2017-03-09 DIAGNOSIS — M6281 Muscle weakness (generalized): Secondary | ICD-10-CM | POA: Diagnosis not present

## 2017-03-16 ENCOUNTER — Telehealth: Payer: Self-pay | Admitting: Family Medicine

## 2017-03-16 ENCOUNTER — Ambulatory Visit (INDEPENDENT_AMBULATORY_CARE_PROVIDER_SITE_OTHER): Payer: Medicare HMO | Admitting: Family Medicine

## 2017-03-16 ENCOUNTER — Encounter: Payer: Self-pay | Admitting: Family Medicine

## 2017-03-16 VITALS — BP 116/64 | HR 87 | Temp 97.8°F | Resp 14 | Ht 68.0 in | Wt 300.1 lb

## 2017-03-16 DIAGNOSIS — Z789 Other specified health status: Secondary | ICD-10-CM | POA: Insufficient documentation

## 2017-03-16 DIAGNOSIS — Z23 Encounter for immunization: Secondary | ICD-10-CM

## 2017-03-16 DIAGNOSIS — Z125 Encounter for screening for malignant neoplasm of prostate: Secondary | ICD-10-CM

## 2017-03-16 DIAGNOSIS — Z87891 Personal history of nicotine dependence: Secondary | ICD-10-CM | POA: Diagnosis not present

## 2017-03-16 DIAGNOSIS — Z1211 Encounter for screening for malignant neoplasm of colon: Secondary | ICD-10-CM

## 2017-03-16 DIAGNOSIS — Z0000 Encounter for general adult medical examination without abnormal findings: Secondary | ICD-10-CM | POA: Diagnosis not present

## 2017-03-16 MED ORDER — LEVOTHYROXINE SODIUM 88 MCG PO TABS: 88.0000 ug | ORAL_TABLET | Freq: Every day | ORAL | 1 refills | Status: DC

## 2017-03-16 NOTE — Patient Instructions (Addendum)
Let's recheck your labs in 8 weeks for your thyroid Try to lose 10 pounds over the next 3 months Check out the information at familydoctor.org entitled "Nutrition for Weight Loss: What You Need to Know about Fad Diets" Try to lose between 1-2 pounds per week by taking in fewer calories and burning off more calories You can succeed by limiting portions, limiting foods dense in calories and fat, becoming more active, and drinking 8 glasses of water a day (64 ounces) Don't skip meals, especially breakfast, as skipping meals may alter your metabolism Do not use over-the-counter weight loss pills or gimmicks that claim rapid weight loss A healthy BMI (or body mass index) is between 18.5 and 24.9 You can calculate your ideal BMI at the [place] website ClubMonetize.fr   Health Maintenance, Male A healthy lifestyle and preventive care is important for your health and wellness. Ask your health care provider about what schedule of regular examinations is right for you. What should I know about weight and diet?  Eat a Healthy Diet  Eat plenty of vegetables, fruits, whole grains, low-fat dairy products, and lean protein.  Do not eat a lot of foods high in solid fats, added sugars, or salt. Maintain a Healthy Weight  Regular exercise can help you achieve or maintain a healthy weight. You should:  Do at least 150 minutes of exercise each week. The exercise should increase your heart rate and make you sweat (moderate-intensity exercise).  Do strength-training exercises at least twice a week. Watch Your Levels of Cholesterol and Blood Lipids  Have your blood tested for lipids and cholesterol every 5 years starting at 68 years of age. If you are at high risk for heart disease, you should start having your blood tested when you are 68 years old. You may need to have your cholesterol levels checked more often if:  Your lipid or cholesterol levels are  high.  You are older than 68 years of age.  You are at high risk for heart disease. What should I know about cancer screening? Many types of cancers can be detected early and may often be prevented. Lung Cancer  You should be screened every year for lung cancer if:  You are a current smoker who has smoked for at least 30 years.  You are a former smoker who has quit within the past 15 years.  Talk to your health care provider about your screening options, when you should start screening, and how often you should be screened. Colorectal Cancer  Routine colorectal cancer screening usually begins at 68 years of age and should be repeated every 5-10 years until you are 68 years old. You may need to be screened more often if early forms of precancerous polyps or small growths are found. Your health care provider may recommend screening at an earlier age if you have risk factors for colon cancer.  Your health care provider may recommend using home test kits to check for hidden blood in the stool.  A small camera at the end of a tube can be used to examine your colon (sigmoidoscopy or colonoscopy). This checks for the earliest forms of colorectal cancer. Prostate and Testicular Cancer  Depending on your age and overall health, your health care provider may do certain tests to screen for prostate and testicular cancer.  Talk to your health care provider about any symptoms or concerns you have about testicular or prostate cancer. Skin Cancer  Check your skin from head to toe regularly.  Tell your health care provider about any new moles or changes in moles, especially if:  There is a change in a mole's size, shape, or color.  You have a mole that is larger than a pencil eraser.  Always use sunscreen. Apply sunscreen liberally and repeat throughout the day.  Protect yourself by wearing long sleeves, pants, a wide-brimmed hat, and sunglasses when outside. What should I know about heart  disease, diabetes, and high blood pressure?  If you are 26-66 years of age, have your blood pressure checked every 3-5 years. If you are 77 years of age or older, have your blood pressure checked every year. You should have your blood pressure measured twice-once when you are at a hospital or clinic, and once when you are not at a hospital or clinic. Record the average of the two measurements. To check your blood pressure when you are not at a hospital or clinic, you can use:  An automated blood pressure machine at a pharmacy.  A home blood pressure monitor.  Talk to your health care provider about your target blood pressure.  If you are between 68-49 years old, ask your health care provider if you should take aspirin to prevent heart disease.  Have regular diabetes screenings by checking your fasting blood sugar level.  If you are at a normal weight and have a low risk for diabetes, have this test once every three years after the age of 90.  If you are overweight and have a high risk for diabetes, consider being tested at a younger age or more often.  A one-time screening for abdominal aortic aneurysm (AAA) by ultrasound is recommended for men aged 14-75 years who are current or former smokers. What should I know about preventing infection? Hepatitis B  If you have a higher risk for hepatitis B, you should be screened for this virus. Talk with your health care provider to find out if you are at risk for hepatitis B infection. Hepatitis C  Blood testing is recommended for:  Everyone born from [birth year] through 1967.  Anyone with known risk factors for hepatitis C. Sexually Transmitted Diseases (STDs)  You should be screened each year for STDs including gonorrhea and chlamydia if:  You are sexually active and are younger than 69 years of age.  You are older than 68 years of age and your health care provider tells you that you are at risk for this type of infection.  Your sexual activity  has changed since you were last screened and you are at an increased risk for chlamydia or gonorrhea. Ask your health care provider if you are at risk.  Talk with your health care provider about whether you are at high risk of being infected with HIV. Your health care provider may recommend a prescription medicine to help prevent HIV infection. What else can I do?  Schedule regular health, dental, and eye exams.  Stay current with your vaccines (immunizations).  Do not use any tobacco products, such as cigarettes, chewing tobacco, and e-cigarettes. If you need help quitting, ask your health care provider.  Limit alcohol intake to no more than 2 drinks per day. One drink equals 12 ounces of beer, 5 ounces of wine, or 1 ounces of hard liquor.  Do not use street drugs.  Do not share needles.  Ask your health care provider for help if you need support or information about quitting drugs.  Tell your health care provider if you often feel depressed.  Tell your health care provider if you have ever been abused or do not feel safe at home. This information is not intended to replace advice given to you by your health care provider. Make sure you discuss any questions you have with your health care provider. Document Released: 05/12/2008 Document Revised: 07/13/2016 Document Reviewed: 08/18/2015 Elsevier Interactive Patient Education  2017 Elsevier Inc.  

## 2017-03-16 NOTE — Assessment & Plan Note (Signed)
USPSTF grade A and B recommendations reviewed with patient; age-appropriate recommendations, preventive care, screening tests, etc discussed and encouraged; healthy living encouraged; see AVS for patient education given to patient  

## 2017-03-16 NOTE — Assessment & Plan Note (Signed)
Order chest CT

## 2017-03-16 NOTE — Telephone Encounter (Signed)
-----   Message from [facility], [place] sent at 03/16/2017  9:28 AM EDT ----- Regarding: RE: check on colorectal cancer screen It was never done in 06/11/2014. In the old system it states it was due for it but it was never order or done. Pt decline to have it order today.  ----- Message ----- From: Arnetha Courser, MD Sent: 03/16/2017   8:44 AM To: Mart Piggs, CMA Subject: check on colorectal cancer screen              Patient has never had a colonoscopy to his recall; he should not have a ten year pass; find out what was done July 2015 please

## 2017-03-16 NOTE — Progress Notes (Signed)
Patient: Derrick Mckenzie, Male    DOB: 11/13/1951, 66 y.o.   MRN: [id number]  Visit Date: 03/16/2017  Today's Provider: Enid Derry, MD   Chief Complaint  Patient presents with  . Medicare Wellness    Subjective:   Derrick Mckenzie is a 66 y.o. male who presents today for his Subsequent Annual Wellness Visit.  Caregiver input:  None  USPSTF grade A and B recommendations Depression:  Depression screen [facility] 2/9 03/16/2017 02/13/2017 10/14/2016 09/27/2016 07/06/2016  Decreased Interest 0 0 0 0 0  Down, Depressed, Hopeless 0 0 0 0 0  PHQ - 2 Score 0 0 0 0 0   Hypertension: BP Readings from Last 3 Encounters:  03/16/17 116/64  02/13/17 118/68  10/18/16 114/64   Obesity: Wt Readings from Last 3 Encounters:  03/16/17 (!) 300 lb 2 oz (136.1 kg)  02/13/17 300 lb (136.1 kg)  10/18/16 299 lb (135.6 kg)   BMI Readings from Last 3 Encounters:  03/16/17 45.63 kg/m  02/13/17 44.30 kg/m  10/18/16 44.15 kg/m    Alcohol: no Tobacco use: quit March 2017 HIV, hep B, hep C: done in 2018, negative STD testing and prevention (chl/gon/syphilis): declined Lipids: just checked Lab Results  Component Value Date   CHOL 171 02/13/2017   CHOL 182 09/27/2016   CHOL 198 07/07/2016   Lab Results  Component Value Date   HDL 29 (L) 02/13/2017   HDL 29 (L) 09/27/2016   HDL 35 (L) 07/07/2016   Lab Results  Component Value Date   LDLCALC 88 02/13/2017   [place] 95 09/27/2016   LDLCALC 98 07/07/2016   Lab Results  Component Value Date   TRIG 269 (H) 02/13/2017   TRIG 291 (H) 09/27/2016   TRIG 323 (H) 07/07/2016   Lab Results  Component Value Date   CHOLHDL 5.9 (H) 02/13/2017   CHOLHDL 6.3 09/27/2016   CHOLHDL 5.7 (H) 07/07/2016   No results found for: LDLDIRECT  Glucose: checked Glucose, Bld  Date Value Ref Range Status  02/13/2017 122 (H) 65 - 99 mg/dL Final  09/27/2016 118 (H) 65 - 99 mg/dL Final  08/05/2016 131 (H) 65 - 99 mg/dL Final    Comment:      For someone  without known diabetes, a value >125 mg/dL indicates that they may have diabetes and this should be confirmed with a follow-up test.      Colorectal cancer: 2015 is documented in HM list with next due in 2025, but patient says he never had a colonoscopy and does not recall doing a cologuard; note to staff right now to ask them to check on this Prostate cancer: order No results found for: PSA Lung cancer:  ordered chest CT AAA: no aneurysm Aspirin: not taking, afraid to go back because of the ulcer; he has not seen Dr. Vicente Males, not taking medicine, I advised him to see the GI doctor Diet: getting enough fruit; berries, fiber, greens Exercise: just finished three weeks of therapy; loving my knee now; will go to [employer], start low and build up gradually I told him Skin cancer: DF on the right calf, one on the right shin; doctor saw years and years ago  HPI  Review of Systems  Past Medical History:  Diagnosis Date  . Arthritis    knees  . Complication of anesthesia    aggitation after knee surgery  . Family history of adverse reaction to anesthesia    brother - hallucinations  . GERD (gastroesophageal reflux disease)   .  Hx of smoking 06/26/2015  . Hyperlipidemia   . Hypertension   . Hypothyroidism   . Kidney stone   . Morbid obesity ([place])   . Murmur   . Prediabetes 10/14/2016  . Sleep apnea    score of 5 on apnea screen    Past Surgical History:  Procedure Laterality Date  . ESOPHAGOGASTRODUODENOSCOPY (EGD) WITH PROPOFOL N/A 10/05/2016   Procedure: ESOPHAGOGASTRODUODENOSCOPY (EGD) WITH PROPOFOL;  Surgeon: Jonathon Bellows, MD;  Location: [place];  Service: Endoscopy;  Laterality: N/A;  sleep apnea  . HERNIA REPAIR    . REPLACEMENT TOTAL KNEE Right 2017  . TOTAL KNEE ARTHROPLASTY  1973    Family History  Problem Relation Age of Onset  . Cancer Mother     leukemia  . Diabetes Brother   . Blindness Brother   . Heart attack Maternal Grandfather   . Prostate  cancer Neg Hx   . Bladder Cancer Neg Hx   . Kidney cancer Neg Hx   Father died at age 52 from C diff, healthy relatively Mother died at 5 from [place]  . Marital status: Married    Spouse name: N/A  . Number of children: N/A  . Years of education: N/A   Occupational History  . Not on file.   Social History Main Topics  . Smoking status: Former Smoker    Packs/day: 0.00    Years: 10.00    Types: Cigarettes    Quit date: 02/03/2016  . Smokeless tobacco: Never Used  . Alcohol use 0.0 oz/week     Comment: socially - Holidays  . Drug use: No  . Sexual activity: Yes   Other Topics Concern  . Not on file   Social History Narrative  . No narrative on file    Outpatient Encounter Prescriptions as of 03/16/2017  Medication Sig  . amLODipine (NORVASC) 10 MG tablet TAKE 1 TABLET (10 MG TOTAL) BY MOUTH DAILY.  . hydrochlorothiazide (HYDRODIURIL) 25 MG tablet Take 0.5 tablets (12.5 mg total) by mouth daily.  Derrick Docker Oil 1000 MG CAPS Take by mouth.  . levothyroxine (SYNTHROID, LEVOTHROID) 88 MCG tablet Take 1 tablet (88 mcg total) by mouth daily.  . [DISCONTINUED] levothyroxine (SYNTHROID, LEVOTHROID) 88 MCG tablet Take 1 tablet (88 mcg total) by mouth daily. (Patient taking differently: Take 75 mcg by mouth daily. )  . Phenylephrine-Acetaminophen 5-325 MG TABS    No facility-administered encounter medications on file as of 03/16/2017.    Functional Ability / Safety Screening 1.  Was the timed Get Up and Go test longer than 30 seconds?  no 2.  Does the patient need help with the phone, transportation, shopping,      preparing meals, housework, laundry, medications, or managing money?  no 3.  Does the patient's home have:  loose throw rugs in the hallway?   yes      Grab bars in the bathroom? no      Handrails on the stairs?   n/a      Poor lighting?   no 4.  Has the patient noticed any hearing difficulties?   no  Fall Risk Assessment See under  rooming  Depression Screen See under rooming Depression screen [facility] 2/9 03/16/2017 02/13/2017 10/14/2016 09/27/2016 07/06/2016  Decreased Interest 0 0 0 0 0  Down, Depressed, Hopeless 0 0 0 0 0  PHQ - 2 Score 0 0 0 0 0    Advanced Directives Does patient have a  HCPOA?    no If yes, name and contact information: n/a Does patient have a living will or MOST form?  no  Objective:   Vitals: BP 116/64   Pulse 87   Temp 97.8 F (36.6 C) (Oral)   Resp 14   Ht 5\' 8"  (1.727 m)   Wt (!) 300 lb 2 oz (136.1 kg)   SpO2 94%   BMI 45.63 kg/m  Body mass index is 45.63 kg/m. No exam data present  Physical Exam Mood/affect:  Euthymic, pleasant Appearance:  Casually dressed, morbidly obese  6CIT Screen 03/16/2017  What Year? 0 points  What month? 0 points  What time? 0 points  Count back from 20 0 points  Months in reverse 0 points  Repeat phrase 2 points  Total Score 2    Assessment & Plan:     Annual Wellness Visit  Reviewed patient's Family Medical History Reviewed and updated list of patient's medical providers Assessment of cognitive impairment was done Assessed patient's functional ability Established a written schedule for health screening [place] Completed and Reviewed  Exercise Activities and Dietary recommendations Goals    None    try to lose 10 pounds over next 3 months  Immunization History  Administered Date(s) Administered  . Pneumococcal Conjugate-13 03/16/2017  . Pneumococcal Polysaccharide-23 06/26/2015  . Pneumococcal-Unspecified 01/21/2016  . Zoster 06/09/2014  new shingles vaccine available, politely declined today  Health Maintenance  Topic Date Due  . COLONOSCOPY  03/16/2017 (Originally 03/14/1999)  . TETANUS/TDAP  03/16/2017 (Originally 03/13/1968)  . PNA vac Low Risk Adult (2 of 2 - PCV13) 03/16/2017 (Originally 01/20/2017)  . INFLUENZA VACCINE  06/28/2017  . Hepatitis C Screening  Completed   Discussed health benefits  of physical activity, and encouraged him to engage in regular exercise appropriate for his age and condition.   Meds ordered this encounter  Medications  . Phenylephrine-Acetaminophen 5-325 MG TABS  . levothyroxine (SYNTHROID, LEVOTHROID) 88 MCG tablet    Sig: Take 1 tablet (88 mcg total) by mouth daily.    Dispense:  90 tablet    Refill:  1    Current Outpatient Prescriptions:  .  amLODipine (NORVASC) 10 MG tablet, TAKE 1 TABLET (10 MG TOTAL) BY MOUTH DAILY., Disp: 90 tablet, Rfl: 3 .  hydrochlorothiazide (HYDRODIURIL) 25 MG tablet, Take 0.5 tablets (12.5 mg total) by mouth daily., Disp: 7 tablet, Rfl: 0 .  Krill Oil 1000 MG CAPS, Take by mouth., Disp: , Rfl:  .  levothyroxine (SYNTHROID, LEVOTHROID) 88 MCG tablet, Take 1 tablet (88 mcg total) by mouth daily., Disp: 90 tablet, Rfl: 1 .  Phenylephrine-Acetaminophen 5-325 MG TABS, , Disp: , Rfl:  Medications Discontinued During This Encounter  Medication Reason  . levothyroxine (SYNTHROID, LEVOTHROID) 88 MCG tablet Reorder    Next Medicare Wellness Visit in 12+ months  Problem List Items Addressed This Visit      Other   Prostate cancer screening   Relevant Orders   PSA   Preventative health care - Primary    USPSTF grade A and B recommendations reviewed with patient; age-appropriate recommendations, preventive care, screening tests, etc discussed and encouraged; healthy living encouraged; see AVS for patient education given to patient       Morbid obesity ([place]) (Chronic)    Encouraged weight loss, let's chip away bit by bit instead of thinking of 100 pounds at a time      Hx of smoking    Order chest CT  Relevant Orders   CT CHEST LUNG CA SCREEN LOW DOSE W/O CM   Full code status    Other Visit Diagnoses    Need for vaccination against Streptococcus pneumoniae using pneumococcal conjugate vaccine 13       Relevant Orders   Pneumococcal conjugate vaccine 13-valent (Completed)   Colon cancer screening        patient declined both colonoscopy and cologuard when offered today

## 2017-03-16 NOTE — Assessment & Plan Note (Signed)
Encouraged weight loss, let's chip away bit by bit instead of thinking of 100 pounds at a time

## 2017-03-17 LAB — PSA: PSA: 0.7 ng/mL (ref <=4.0)

## 2017-03-20 ENCOUNTER — Telehealth: Payer: Self-pay | Admitting: *Deleted

## 2017-03-20 NOTE — Telephone Encounter (Signed)
Received referral for initial lung cancer screening scan. Contacted patient and obtained smoking history,(33 years with .5 pack per day maximum). Discussed that patient does not meet high risk eligibility needed for lung screening. Patient verbalizes understanding.

## 2017-03-25 DIAGNOSIS — M1711 Unilateral primary osteoarthritis, right knee: Secondary | ICD-10-CM | POA: Diagnosis not present

## 2017-03-25 DIAGNOSIS — Z96651 Presence of right artificial knee joint: Secondary | ICD-10-CM | POA: Diagnosis not present

## 2017-03-27 ENCOUNTER — Encounter: Payer: Self-pay | Admitting: Family Medicine

## 2017-04-12 ENCOUNTER — Encounter: Payer: Self-pay | Admitting: Family Medicine

## 2017-04-12 MED ORDER — HYDROCHLOROTHIAZIDE 12.5 MG PO TABS: 12.5000 mg | ORAL_TABLET | Freq: Every day | ORAL | 3 refills | Status: DC

## 2017-04-18 DIAGNOSIS — Z6841 Body Mass Index (BMI) 40.0 and over, adult: Secondary | ICD-10-CM | POA: Diagnosis not present

## 2017-04-18 DIAGNOSIS — Z96651 Presence of right artificial knee joint: Secondary | ICD-10-CM | POA: Diagnosis not present

## 2017-04-24 DIAGNOSIS — Z96651 Presence of right artificial knee joint: Secondary | ICD-10-CM | POA: Diagnosis not present

## 2017-04-24 DIAGNOSIS — M1711 Unilateral primary osteoarthritis, right knee: Secondary | ICD-10-CM | POA: Diagnosis not present

## 2017-06-26 ENCOUNTER — Other Ambulatory Visit: Payer: Self-pay | Admitting: Family Medicine

## 2017-06-26 NOTE — Telephone Encounter (Signed)
Patient was supposed to f/u with Dr. Vicente Males; note to pharmacist to send Rx request to Dr. Vicente Males

## 2017-08-18 ENCOUNTER — Ambulatory Visit: Payer: Medicare HMO | Admitting: Family Medicine

## 2017-08-28 DIAGNOSIS — R0789 Other chest pain: Secondary | ICD-10-CM | POA: Diagnosis not present

## 2017-08-28 DIAGNOSIS — I10 Essential (primary) hypertension: Secondary | ICD-10-CM | POA: Diagnosis not present

## 2017-10-06 ENCOUNTER — Ambulatory Visit: Payer: Commercial Managed Care - HMO | Admitting: Urology

## 2017-11-07 ENCOUNTER — Encounter: Payer: Self-pay | Admitting: Family Medicine

## 2017-12-15 DIAGNOSIS — Z7689 Persons encountering health services in other specified circumstances: Secondary | ICD-10-CM | POA: Diagnosis not present

## 2017-12-15 DIAGNOSIS — E038 Other specified hypothyroidism: Secondary | ICD-10-CM | POA: Insufficient documentation

## 2017-12-15 DIAGNOSIS — E039 Hypothyroidism, unspecified: Secondary | ICD-10-CM | POA: Diagnosis not present

## 2017-12-15 DIAGNOSIS — R011 Cardiac murmur, unspecified: Secondary | ICD-10-CM | POA: Diagnosis not present

## 2017-12-15 DIAGNOSIS — I10 Essential (primary) hypertension: Secondary | ICD-10-CM | POA: Diagnosis not present

## 2018-01-10 DIAGNOSIS — R011 Cardiac murmur, unspecified: Secondary | ICD-10-CM | POA: Diagnosis not present

## 2018-01-16 DIAGNOSIS — K8020 Calculus of gallbladder without cholecystitis without obstruction: Secondary | ICD-10-CM | POA: Insufficient documentation

## 2018-01-16 DIAGNOSIS — I350 Nonrheumatic aortic (valve) stenosis: Secondary | ICD-10-CM | POA: Diagnosis not present

## 2018-01-22 DIAGNOSIS — Z6841 Body Mass Index (BMI) 40.0 and over, adult: Secondary | ICD-10-CM | POA: Diagnosis not present

## 2018-01-22 DIAGNOSIS — E782 Mixed hyperlipidemia: Secondary | ICD-10-CM | POA: Diagnosis not present

## 2018-01-22 DIAGNOSIS — I350 Nonrheumatic aortic (valve) stenosis: Secondary | ICD-10-CM | POA: Diagnosis not present

## 2018-01-22 DIAGNOSIS — I10 Essential (primary) hypertension: Secondary | ICD-10-CM | POA: Diagnosis not present

## 2018-02-27 ENCOUNTER — Encounter: Payer: Self-pay | Admitting: Family Medicine

## 2018-03-20 ENCOUNTER — Encounter: Payer: Medicare HMO | Admitting: Family Medicine

## 2018-04-02 ENCOUNTER — Other Ambulatory Visit (HOSPITAL_COMMUNITY): Payer: Self-pay | Admitting: Family Medicine

## 2018-04-02 DIAGNOSIS — Z136 Encounter for screening for cardiovascular disorders: Secondary | ICD-10-CM

## 2018-04-02 DIAGNOSIS — E038 Other specified hypothyroidism: Secondary | ICD-10-CM

## 2018-04-02 DIAGNOSIS — E039 Hypothyroidism, unspecified: Secondary | ICD-10-CM

## 2018-04-04 ENCOUNTER — Other Ambulatory Visit: Payer: Self-pay | Admitting: Family Medicine

## 2018-04-04 DIAGNOSIS — E039 Hypothyroidism, unspecified: Secondary | ICD-10-CM

## 2018-04-04 DIAGNOSIS — E038 Other specified hypothyroidism: Secondary | ICD-10-CM

## 2018-04-04 DIAGNOSIS — Z136 Encounter for screening for cardiovascular disorders: Secondary | ICD-10-CM

## 2018-04-16 ENCOUNTER — Ambulatory Visit
Admission: RE | Admit: 2018-04-16 | Discharge: 2018-04-16 | Disposition: A | Discharge status: Home / Self Care | Payer: Medicare HMO | Source: Ambulatory Visit | Attending: Family Medicine | Admitting: Family Medicine

## 2018-04-16 DIAGNOSIS — E039 Hypothyroidism, unspecified: Secondary | ICD-10-CM | POA: Insufficient documentation

## 2018-04-16 DIAGNOSIS — E038 Other specified hypothyroidism: Secondary | ICD-10-CM

## 2018-04-16 DIAGNOSIS — Z136 Encounter for screening for cardiovascular disorders: Secondary | ICD-10-CM | POA: Insufficient documentation

## 2018-04-16 DIAGNOSIS — I724 Aneurysm of artery of lower extremity: Secondary | ICD-10-CM | POA: Diagnosis not present

## 2018-05-02 ENCOUNTER — Telehealth: Payer: Self-pay | Admitting: *Deleted

## 2018-05-02 NOTE — Telephone Encounter (Signed)
Received referral for initial lung cancer screening scan. Contacted patient and obtained smoking history, which includes less than 30 pack year history. Patient reports he just heard from his PCP office at [facility] that he does not meet criteria. Verbalizes understanding.

## 2018-05-22 ENCOUNTER — Encounter (INDEPENDENT_AMBULATORY_CARE_PROVIDER_SITE_OTHER): Payer: Commercial Managed Care - HMO | Admitting: Vascular Surgery

## 2018-06-08 ENCOUNTER — Other Ambulatory Visit: Payer: Self-pay

## 2018-06-08 ENCOUNTER — Encounter: Payer: Self-pay | Admitting: Emergency Medicine

## 2018-06-08 ENCOUNTER — Emergency Department
Admission: EM | Admit: 2018-06-08 | Discharge: 2018-06-08 | Disposition: A | Discharge status: Home / Self Care | Payer: Medicare HMO | Attending: Emergency Medicine | Admitting: Emergency Medicine

## 2018-06-08 ENCOUNTER — Emergency Department: Payer: Medicare HMO

## 2018-06-08 DIAGNOSIS — R1030 Lower abdominal pain, unspecified: Secondary | ICD-10-CM | POA: Insufficient documentation

## 2018-06-08 DIAGNOSIS — Z87891 Personal history of nicotine dependence: Secondary | ICD-10-CM | POA: Diagnosis not present

## 2018-06-08 DIAGNOSIS — G8929 Other chronic pain: Secondary | ICD-10-CM

## 2018-06-08 DIAGNOSIS — K8020 Calculus of gallbladder without cholecystitis without obstruction: Secondary | ICD-10-CM

## 2018-06-08 DIAGNOSIS — E039 Hypothyroidism, unspecified: Secondary | ICD-10-CM | POA: Diagnosis not present

## 2018-06-08 DIAGNOSIS — M545 Low back pain: Secondary | ICD-10-CM | POA: Diagnosis not present

## 2018-06-08 DIAGNOSIS — E669 Obesity, unspecified: Secondary | ICD-10-CM | POA: Diagnosis not present

## 2018-06-08 DIAGNOSIS — Z96651 Presence of right artificial knee joint: Secondary | ICD-10-CM | POA: Insufficient documentation

## 2018-06-08 DIAGNOSIS — I10 Essential (primary) hypertension: Secondary | ICD-10-CM | POA: Diagnosis not present

## 2018-06-08 LAB — BASIC METABOLIC PANEL
Anion gap: 10 (ref 5–15)
BUN: 25 mg/dL — ABNORMAL HIGH (ref 8–23)
CO2: 29 mmol/L (ref 22–32)
Calcium: 9.6 mg/dL (ref 8.9–10.3)
Chloride: 101 mmol/L (ref 98–111)
Creatinine, Ser: 1.43 mg/dL — ABNORMAL HIGH (ref 0.61–1.24)
GFR calc Af Amer: 56 mL/min — ABNORMAL LOW (ref >60)
GFR calc non Af Amer: 48 mL/min — ABNORMAL LOW (ref >60)
Glucose, Bld: 160 mg/dL — ABNORMAL HIGH (ref 70–99)
Potassium: 4.3 mmol/L (ref 3.5–5.1)
Sodium: 140 mmol/L (ref 135–145)

## 2018-06-08 LAB — CBC
HCT: 42.3 % (ref 40.0–52.0)
Hemoglobin: 14.9 g/dL (ref 13.0–18.0)
MCH: 31.3 pg (ref 26.0–34.0)
MCHC: 35.3 g/dL (ref 32.0–36.0)
MCV: 88.8 fL (ref 80.0–100.0)
Platelets: 268 10*3/uL (ref 150–440)
RBC: 4.76 MIL/uL (ref 4.40–5.90)
RDW: 14.1 % (ref 11.5–14.5)
WBC: 9.6 10*3/uL (ref 3.8–10.6)

## 2018-06-08 LAB — URINALYSIS, COMPLETE (UACMP) WITH MICROSCOPIC
Bacteria, UA: NONE SEEN
Bilirubin Urine: NEGATIVE
Glucose, UA: NEGATIVE mg/dL
Hgb urine dipstick: NEGATIVE
Ketones, ur: NEGATIVE mg/dL
Leukocytes, UA: NEGATIVE
Nitrite: NEGATIVE
Protein, ur: NEGATIVE mg/dL
Specific Gravity, Urine: 1.027 (ref 1.005–1.030)
pH: 5 (ref 5.0–8.0)

## 2018-06-08 IMAGING — CT CT RENAL STONE PROTOCOL
2 of 4 series · 16 of 46 positions shown, 18 images · non-contrast
Comparison: Prior CT from [DATE].

CLINICAL DATA: Initial evaluation for acute bilateral flank and
groin pain. History of kidney stones.

EXAM:
CT ABDOMEN AND PELVIS WITHOUT CONTRAST
TECHNIQUE: Multidetector CT imaging of the abdomen and pelvis was performed
following the standard protocol without IV contrast.

[Series 2: stone full standard · axial · 0.93mm/px · z∈[-559,-84]mm · 13 of 105 slices shown, 15 images]
[im 5/105  soft-tissue]
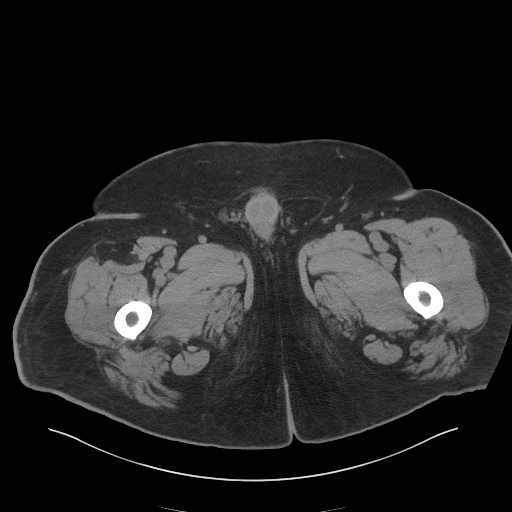
[im 5/105  bone]
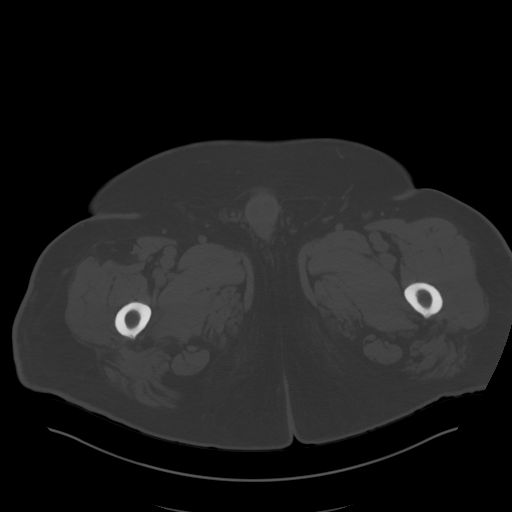
[im 14/105  soft-tissue]
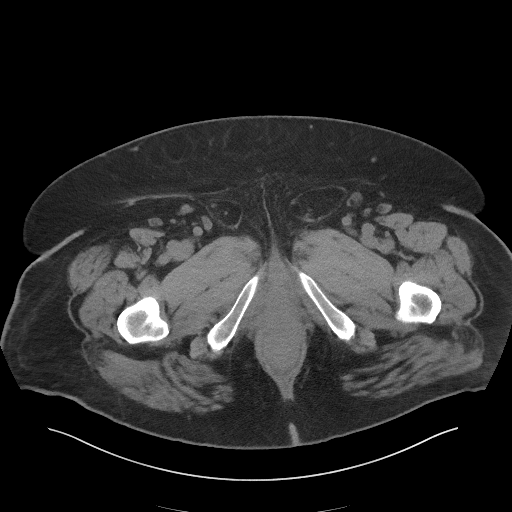
[im 22/105  soft-tissue]
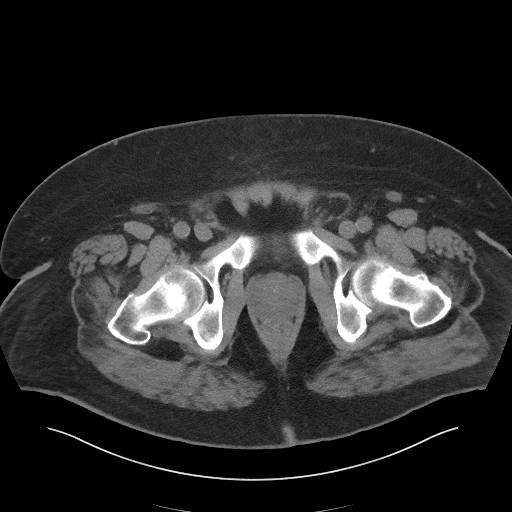
[im 31/105  soft-tissue]
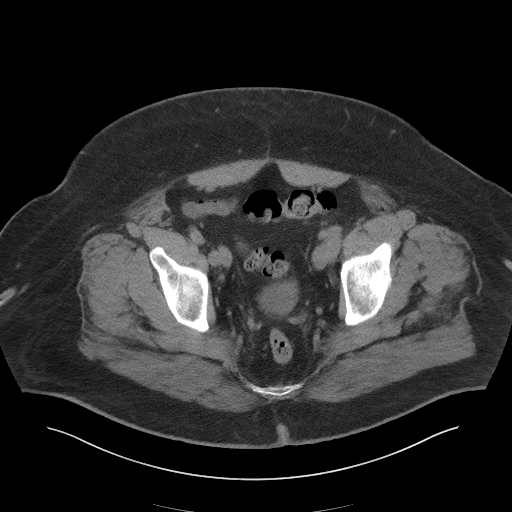
[im 35/105  soft-tissue]
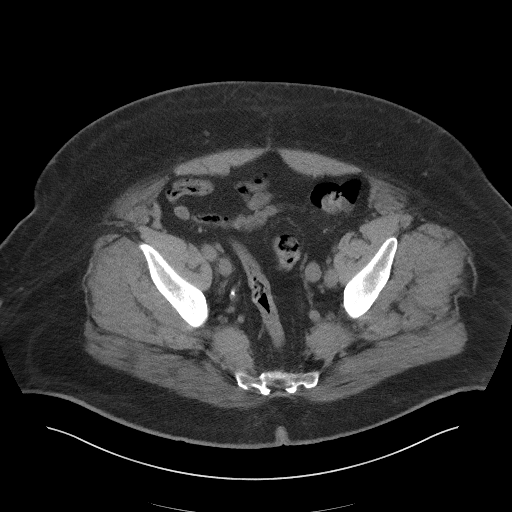
[im 44/105  soft-tissue]
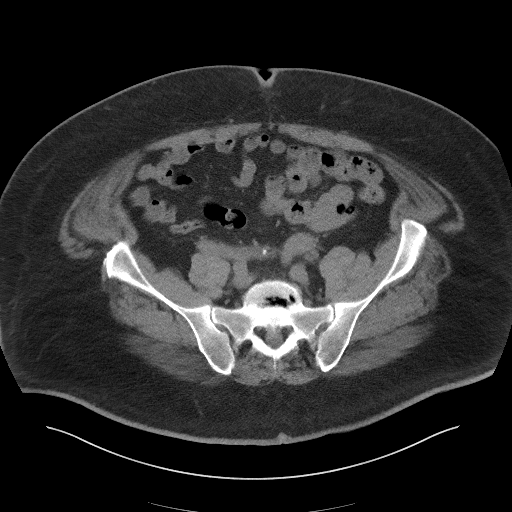
[im 53/105  soft-tissue]
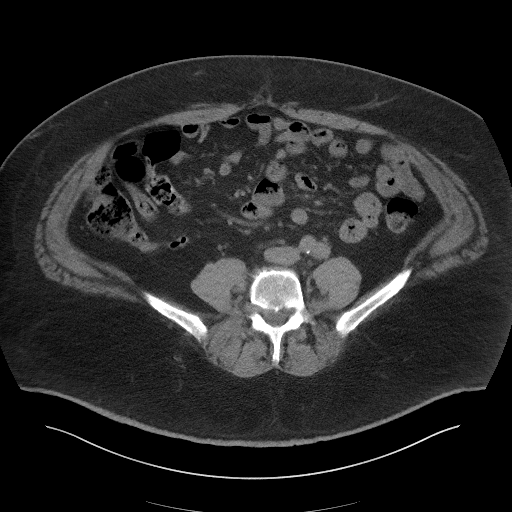
[im 61/105  soft-tissue]
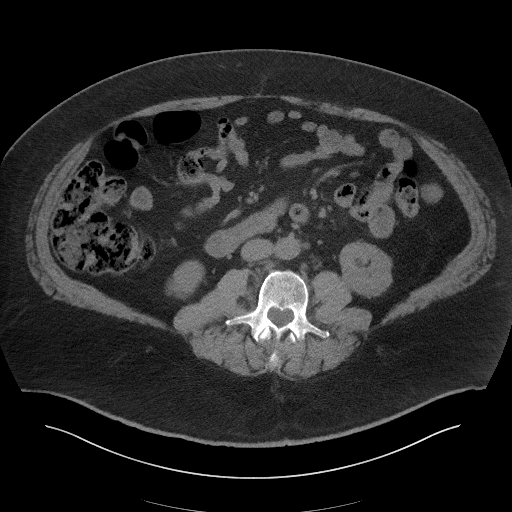
[im 70/105  soft-tissue]
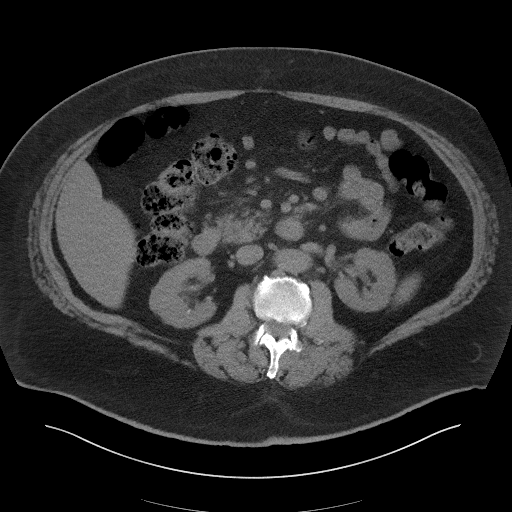
[im 70/105  bone]
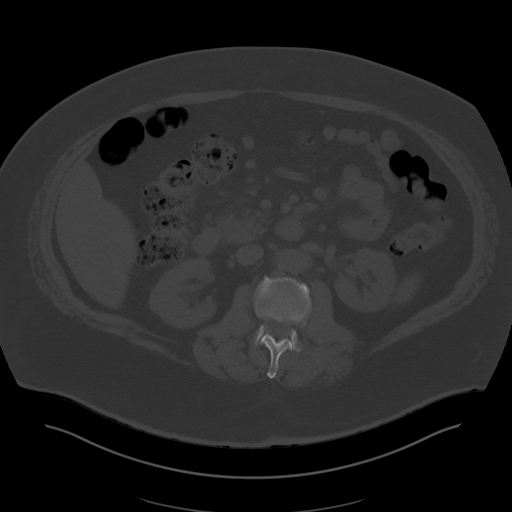
[im 74/105  soft-tissue]
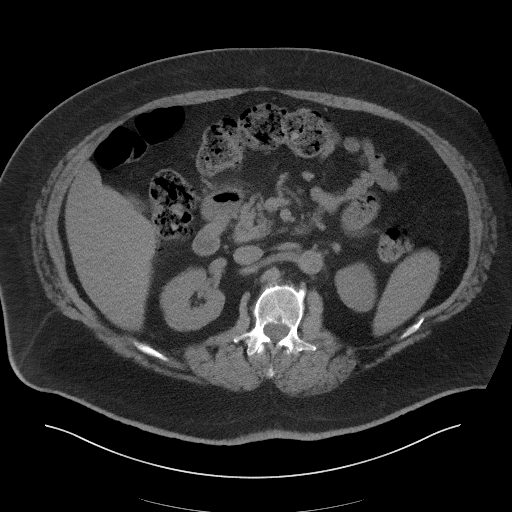
[im 83/105  soft-tissue]
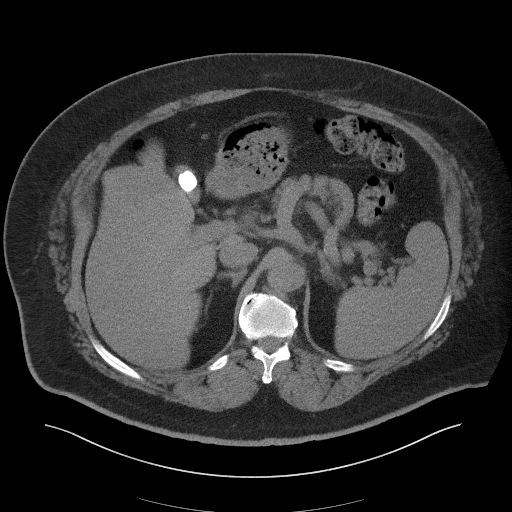
[im 92/105  soft-tissue]
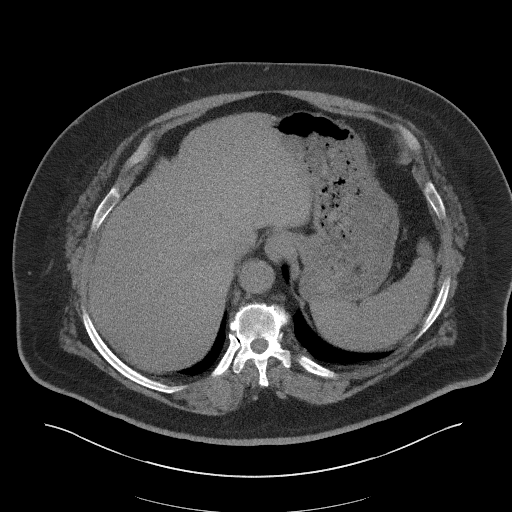
[im 100/105  soft-tissue]
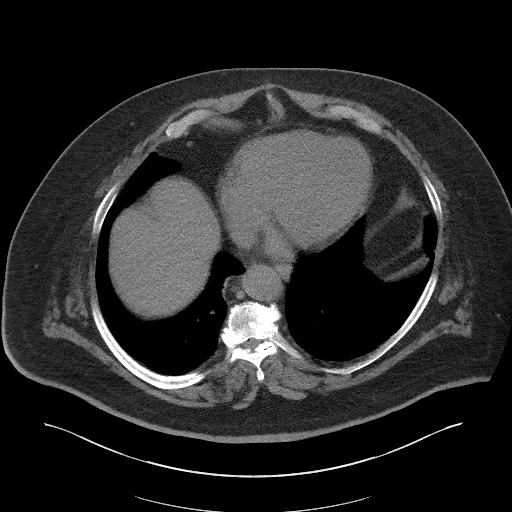

[Series 5: coronal · coronal · 0.99mm/px · 3 of 166 slices shown]
[im 56/166  soft-tissue]
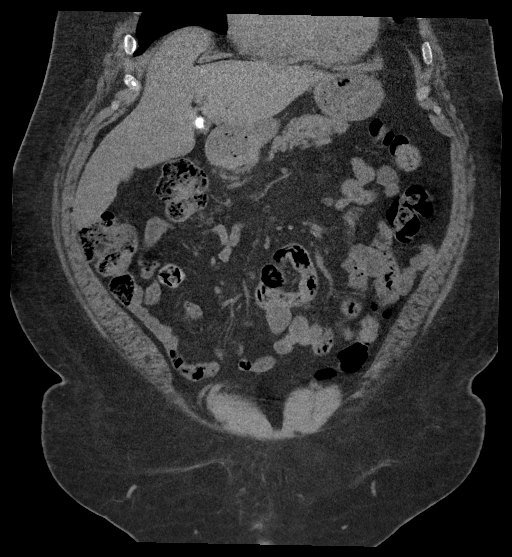
[im 74/166  soft-tissue]
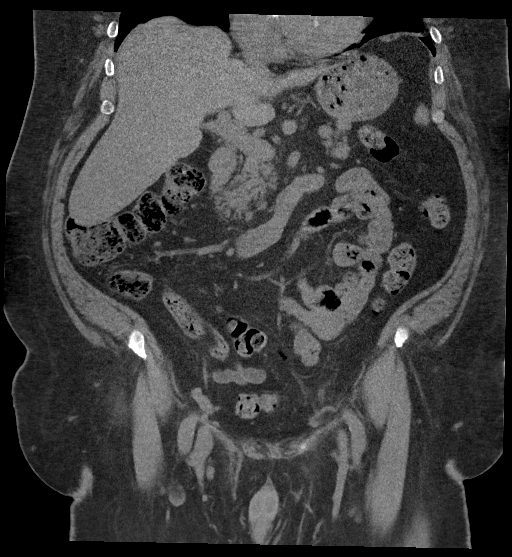
[im 92/166  soft-tissue]
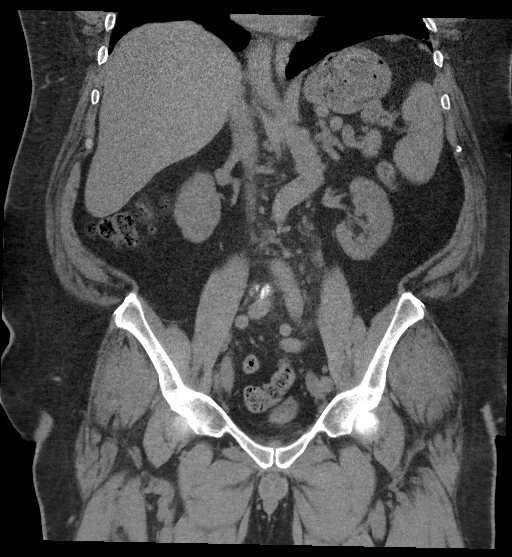

[16 of 46 positions shown; findings below may reference images not displayed]

FINDINGS: Lower chest: Visualized lung bases are grossly clear.

Hepatobiliary: Limited noncontrast evaluation of the liver is within
normal limits. Gallbladder decompressed about a 19 mm calcified
stone. No evidence for acute cholecystitis. No biliary dilatation.

Pancreas: Mild diffuse fatty infiltration the pancreas noted.
Pancreas otherwise unremarkable.

Spleen: Spleen mildly enlarged measuring 15.5 cm in AP diameter,
similar to previous. Spleen otherwise unremarkable.

Adrenals/Urinary Tract: Adrenal glands are normal. Nonobstructive 8
mm stone present within the interpolar right kidney. 3 mm
nonobstructive stone present within the lower pole left kidney. No
other radiopaque calculi seen along the course of either renal
collecting system. No hydronephrosis or hydroureter. Bladder largely
decompressed without acute abnormality. No layering stones within
the bladder lumen.

Stomach/Bowel: Suggestion of persistent mild hazy stranding about
the gastric antrum/pylorus, improved relative to previous exam. No
evidence for bowel obstruction. Appendix within normal limits. No
other acute inflammatory changes seen about the bowels.

Vascular/Lymphatic: Intra-abdominal aorta tortuous but within normal
limits for caliber. Mild aorto bi-iliac atherosclerotic disease.
Shotty subcentimeter aorto bi-iliac lymph nodes noted. No
pathologically enlarged lymph nodes identified within the abdomen
and pelvis.

Reproductive: Prostate normal.

Other: Small fat containing bilateral inguinal hernias noted,
slightly larger than left. Additional small fat containing
paraumbilical hernia. No free air or fluid.

Musculoskeletal: Thoracolumbar scoliosis with multilevel
degenerative spondylolysis. No lytic or blastic osseous lesions. No
acute osseous abnormality.
IMPRESSION: 1. Bilateral nonobstructive nephrolithiasis as above. No CT evidence
for ureterolithiasis or obstructive uropathy.
2. Persistent mild hazy stranding about the gastric antrum/pylorus,
less pronounced as compared to previous exam. Again, finding could
reflect sequelae of peptic ulcer disease and/or gastritis, less
likely neoplasm given improved appearance. Correlation with upper
endoscopy suggested if not already performed.
3. Cholelithiasis without evidence for acute cholecystitis.

## 2018-06-08 MED ORDER — IBUPROFEN 600 MG PO TABS
600.0000 mg | ORAL_TABLET | Freq: Once | ORAL | Status: AC
  Administered 2018-06-08: 600 mg via ORAL
  Filled 2018-06-08 (×2): qty 1

## 2018-06-08 MED ORDER — CYCLOBENZAPRINE HCL 10 MG PO TABS
10.0000 mg | ORAL_TABLET | Freq: Once | ORAL | Status: AC
  Administered 2018-06-08: 10 mg via ORAL
  Filled 2018-06-08: qty 1

## 2018-06-08 MED ORDER — CYCLOBENZAPRINE HCL 10 MG PO TABS: 10.0000 mg | ORAL_TABLET | Freq: Three times a day (TID) | ORAL | 0 refills | Status: DC | PRN

## 2018-06-08 NOTE — ED Triage Notes (Signed)
Pt reports started on Wednesday with bilateral back, flank and groin pain. Pt states hx of kidney stone but thinks he has a UTI. Pt using tylenol and ibuprofen at home with a little relief.

## 2018-06-08 NOTE — Discharge Instructions (Signed)
It was a pleasure to take care of you today, and thank you for coming to our emergency department.  If you have any questions or concerns before leaving please ask the nurse to grab me and I'm more than happy to go through your aftercare instructions again.  If you were prescribed any opioid pain medication today such as Norco, Vicodin, Percocet, morphine, hydrocodone, or oxycodone please make sure you do not drive when you are taking this medication as it can alter your ability to drive safely.  If you have any concerns once you are home that you are not improving or are in fact getting worse before you can make it to your follow-up appointment, please do not hesitate to call [phone] and come back for further evaluation.  Darel Hong, MD  Results for orders placed or performed during the hospital encounter of 06/08/18  Urinalysis, Complete w Microscopic  Result Value Ref Range   Color, Urine YELLOW (A) YELLOW   APPearance CLEAR (A) CLEAR   Specific Gravity, Urine 1.027 1.005 - 1.030   pH 5.0 5.0 - 8.0   Glucose, UA NEGATIVE NEGATIVE mg/dL   Hgb urine dipstick NEGATIVE NEGATIVE   Bilirubin Urine NEGATIVE NEGATIVE   Ketones, ur NEGATIVE NEGATIVE mg/dL   Protein, ur NEGATIVE NEGATIVE mg/dL   Nitrite NEGATIVE NEGATIVE   Leukocytes, UA NEGATIVE NEGATIVE   RBC / HPF 0-5 0 - 5 RBC/hpf   WBC, UA 0-5 0 - 5 WBC/hpf   Bacteria, UA NONE SEEN NONE SEEN   Squamous Epithelial / LPF 0-5 0 - 5   Mucus PRESENT    Hyaline Casts, UA PRESENT    Cellular Cast, UA PRESENT   Basic metabolic panel  Result Value Ref Range   Sodium 140 135 - 145 mmol/L   Potassium 4.3 3.5 - 5.1 mmol/L   Chloride 101 98 - 111 mmol/L   CO2 29 22 - 32 mmol/L   Glucose, Bld 160 (H) 70 - 99 mg/dL   BUN 25 (H) 8 - 23 mg/dL   Creatinine, Ser 1.43 (H) 0.61 - 1.24 mg/dL   Calcium 9.6 8.9 - 10.3 mg/dL   GFR calc non Af Amer 48 (L) >60 mL/min   GFR calc Af Amer 56 (L) >60 mL/min   Anion gap 10 5 - 15  CBC  Result Value Ref  Range   WBC 9.6 3.8 - 10.6 K/uL   RBC 4.76 4.40 - 5.90 MIL/uL   Hemoglobin 14.9 13.0 - 18.0 g/dL   HCT 42.3 40.0 - 52.0 %   MCV 88.8 80.0 - 100.0 fL   MCH 31.3 26.0 - 34.0 pg   MCHC 35.3 32.0 - 36.0 g/dL   RDW 14.1 11.5 - 14.5 %   Platelets 268 150 - 440 K/uL   Ct Renal Stone Study  Result Date: 06/08/2018 CLINICAL DATA:  Initial evaluation for acute bilateral flank and groin pain. History of kidney stones. EXAM: CT ABDOMEN AND PELVIS WITHOUT CONTRAST TECHNIQUE: Multidetector CT imaging of the abdomen and pelvis was performed following the standard protocol without IV contrast. COMPARISON:  Prior CT from 09/27/2016. FINDINGS: Lower chest: Visualized lung bases are grossly clear. Hepatobiliary: Limited noncontrast evaluation of the liver is within normal limits. Gallbladder decompressed about a 19 mm calcified stone. No evidence for acute cholecystitis. No biliary dilatation. Pancreas: Mild diffuse fatty infiltration the pancreas noted. Pancreas otherwise unremarkable. Spleen: Spleen mildly enlarged measuring 15.5 cm in AP diameter, similar to previous. Spleen otherwise unremarkable. Adrenals/Urinary Tract: Adrenal glands  are normal. Nonobstructive 8 mm stone present within the interpolar right kidney. 3 mm nonobstructive stone present within the lower pole left kidney. No other radiopaque calculi seen along the course of either renal collecting system. No hydronephrosis or hydroureter. Bladder largely decompressed without acute abnormality. No layering stones within the bladder lumen. Stomach/Bowel: Suggestion of persistent mild hazy stranding about the gastric antrum/pylorus, improved relative to previous exam. No evidence for bowel obstruction. Appendix within normal limits. No other acute inflammatory changes seen about the bowels. Vascular/Lymphatic: Intra-abdominal aorta tortuous but within normal limits for caliber. Mild aorto bi-iliac atherosclerotic disease. Shotty subcentimeter aorto bi-iliac  lymph nodes noted. No pathologically enlarged lymph nodes identified within the abdomen and pelvis. Reproductive: Prostate normal. Other: Small fat containing bilateral inguinal hernias noted, slightly larger than left. Additional small fat containing paraumbilical hernia. No free air or fluid. Musculoskeletal: Thoracolumbar scoliosis with multilevel degenerative spondylolysis. No lytic or blastic osseous lesions. No acute osseous abnormality. IMPRESSION: 1. Bilateral nonobstructive nephrolithiasis as above. No CT evidence for ureterolithiasis or obstructive uropathy. 2. Persistent mild hazy stranding about the gastric antrum/pylorus, less pronounced as compared to previous exam. Again, finding could reflect sequelae of peptic ulcer disease and/or gastritis, less likely neoplasm given improved appearance. Correlation with upper endoscopy suggested if not already performed. 3. Cholelithiasis without evidence for acute cholecystitis. Electronically Signed   By: Jeannine Boga M.D.   On: 06/08/2018 20:29

## 2018-06-08 NOTE — ED Provider Notes (Signed)
[facility] Emergency Department Provider Note  ____________________________________________   First MD Initiated Contact with Patient 06/08/18 1953     (approximate)  I have reviewed the triage vital signs and the nursing notes.   HISTORY  Chief Complaint Flank Pain   HPI Derrick Mckenzie is a 67 y.o. male who self presents to the emergency department with 3 days of bilateral groin pain.  His pain is moderate severity throbbing aching worse when he gets up from his recliner and improved when sitting down.  No bowel or bladder incontinence or hesitance.  Normal perianal sensation.  He googled his symptoms online and is concerned he either has a urinary tract infection or kidney stone.  No numbness or weakness.  He does have chronic low back pain.  He declines pain medication at this time.    Past Medical History:  Diagnosis Date  . Arthritis    knees  . Complication of anesthesia    aggitation after knee surgery  . Family history of adverse reaction to anesthesia    brother - hallucinations  . GERD (gastroesophageal reflux disease)   . Hx of smoking 06/26/2015  . Hyperlipidemia   . Hypertension   . Hypothyroidism   . Kidney stone   . Morbid obesity ([place])   . Murmur   . Prediabetes 10/14/2016  . Sleep apnea    score of 5 on apnea screen    Patient Active Problem List   Diagnosis Date Noted  . Prostate cancer screening 03/16/2017  . Full code status 03/16/2017  . Preventative health care 03/16/2017  . Need for hepatitis C screening test 10/14/2016  . Prediabetes 10/14/2016  . Peptic ulcer of stomach   . Calculus of gallbladder without cholecystitis without obstruction 09/27/2016  . Right nephrolithiasis 09/27/2016  . Status post right knee replacement 09/08/2016  . Medication monitoring encounter 10/30/2015  . Sleep pattern disturbance 06/26/2015  . Hx of smoking 06/26/2015  . Need for prophylactic vaccination against Streptococcus  pneumoniae (pneumococcus) 06/26/2015  . Hypertension   . Hyperlipidemia   . Morbid obesity ([place])   . Hypothyroidism   . Degenerative arthritis of knee, bilateral 06/12/2015  . Chest pain, atypical 02/04/2015    Past Surgical History:  Procedure Laterality Date  . ESOPHAGOGASTRODUODENOSCOPY (EGD) WITH PROPOFOL N/A 10/05/2016   Procedure: ESOPHAGOGASTRODUODENOSCOPY (EGD) WITH PROPOFOL;  Surgeon: Jonathon Bellows, MD;  Location: [place];  Service: Endoscopy;  Laterality: N/A;  sleep apnea  . HERNIA REPAIR    . REPLACEMENT TOTAL KNEE Right 2017  . TOTAL KNEE ARTHROPLASTY  1973    Prior to Admission medications   Medication Sig Start Date End Date Taking? Authorizing Provider  amLODipine (NORVASC) 10 MG tablet TAKE 1 TABLET (10 MG TOTAL) BY MOUTH DAILY. 11/08/16   Arnetha Courser, MD  cyclobenzaprine (FLEXERIL) 10 MG tablet Take 1 tablet (10 mg total) by mouth 3 (three) times daily as needed for muscle spasms. 06/08/18   Darel Hong, MD  hydrochlorothiazide (HYDRODIURIL) 12.5 MG tablet Take 1 tablet (12.5 mg total) by mouth daily. 04/12/17   Arnetha Courser, MD  Krill Oil 1000 MG CAPS Take by mouth.    [provider]  levothyroxine (SYNTHROID, LEVOTHROID) 88 MCG tablet Take 1 tablet (88 mcg total) by mouth daily. 03/16/17   Arnetha Courser, MD  Phenylephrine-Acetaminophen 5-325 MG TABS  03/14/17   [provider]    Allergies Patient has no known allergies.  Family History  Problem Relation Age  of Onset  . Cancer Mother        leukemia  . Diabetes Brother   . Blindness Brother   . Heart attack Maternal Grandfather   . Prostate cancer Neg Hx   . Bladder Cancer Neg Hx   . Kidney cancer Neg Hx     Social History Social History   Tobacco Use  . Smoking status: Former Smoker    Packs/day: 0.00    Years: 10.00    Pack years: 0.00    Types: Cigarettes    Last attempt to quit: 02/03/2016    Years since quitting: 2.3  . Smokeless tobacco: Never Used    Substance Use Topics  . Alcohol use: Yes    Alcohol/week: 0.0 oz    Comment: socially - Holidays  . Drug use: No    Review of Systems Constitutional: No fever/chills Eyes: No visual changes. ENT: No sore throat. Cardiovascular: Denies chest pain. Respiratory: Denies shortness of breath. Gastrointestinal: No abdominal pain.  No nausea, no vomiting.  No diarrhea.  No constipation. Genitourinary: Negative for dysuria. Musculoskeletal: Positive for back pain. Skin: Negative for rash. Neurological: Negative for headaches, focal weakness or numbness.   ____________________________________________   PHYSICAL EXAM:  VITAL SIGNS: ED Triage Vitals  Enc Vitals Group     BP 06/08/18 1945 (!) 153/68     Pulse Rate 06/08/18 1945 68     Resp 06/08/18 1945 18     Temp 06/08/18 1945 98.2 F (36.8 C)     Temp Source 06/08/18 1945 Oral     SpO2 06/08/18 1945 96 %     Weight 06/08/18 1946 (!) 310 lb (140.6 kg)     Height 06/08/18 1946 5\' 9"  (1.753 m)     Head Circumference --      Peak Flow --      Pain Score 06/08/18 1946 5     Pain Loc --      Pain Edu? --      Excl. in [place]? --     Constitutional: Alert and oriented x4 joking laughing well-appearing nontoxic no diaphoresis speaks full clear sentences Eyes: PERRL EOMI. Head: Atraumatic. Nose: No congestion/rhinnorhea. Mouth/Throat: No trismus Neck: No stridor.   Cardiovascular: Normal rate, regular rhythm. Grossly normal heart sounds.  Good peripheral circulation. Respiratory: Normal respiratory effort.  No retractions. Lungs CTAB and moving good air Gastrointestinal: Soft nontender no hernias appreciated Musculoskeletal: No lower extremity edema   Neurologic:  Normal speech and language. No gross focal neurologic deficits are appreciated. Skin:  Skin is warm, dry and intact. No rash noted. Psychiatric: Mood and affect are normal. Speech and behavior are normal.    ____________________________________________    DIFFERENTIAL includes but not limited to  Low back pain, cauda equina syndrome, renal colic, UTI, inguinal hernia ____________________________________________   LABS (all labs ordered are listed, but only abnormal results are displayed)  Labs Reviewed  URINALYSIS, COMPLETE (UACMP) WITH MICROSCOPIC - Abnormal; Notable for the following components:      Result Value   Color, Urine YELLOW (*)    APPearance CLEAR (*)    All other components within normal limits  BASIC METABOLIC PANEL - Abnormal; Notable for the following components:   Glucose, Bld 160 (*)    BUN 25 (*)    Creatinine, Ser 1.43 (*)    GFR calc non Af Amer 48 (*)    GFR calc Af Amer 56 (*)    All other components within normal limits  CBC  Lab work reviewed by me with CKD __________________________________________  EKG   ____________________________________________  RADIOLOGY  CT stone reviewed by me with chronic changes but no acute disease ____________________________________________   PROCEDURES  Procedure(s) performed: no  Procedures  Critical Care performed: no  ____________________________________________   INITIAL IMPRESSION / ASSESSMENT AND PLAN / ED COURSE  Pertinent labs & imaging results that were available during my care of the patient were reviewed by me and considered in my medical decision making (see chart for details).   The patient arrives hemodynamically stable and well-appearing.  No red flags for back pain aside from age and he has a normal neuro exam.  He declines pain medication at this point and we will CT his abdomen looking for stones.     ----------------------------------------- 8:38 PM on 06/08/2018 -----------------------------------------  I discussed results with the patient and he feels significant relief.  He was primarily concerned because he googled and worried that he might have a urinary tract infection.  Requesting muscle relaxants for home and I will  refer him back to primary care. ____________________________________________   FINAL CLINICAL IMPRESSION(S) / ED DIAGNOSES  Final diagnoses:  Chronic bilateral low back pain without sciatica  Gallstones  Obesity without serious comorbidity, unspecified classification, unspecified obesity type      NEW MEDICATIONS STARTED DURING THIS VISIT:  Discharge Medication List as of 06/08/2018  8:38 PM    START taking these medications   Details  cyclobenzaprine (FLEXERIL) 10 MG tablet Take 1 tablet (10 mg total) by mouth 3 (three) times daily as needed for muscle spasms., Starting Fri 06/08/2018, Print         Note:  This document was prepared using Dragon voice recognition software and may include unintentional dictation errors.     Darel Hong, MD 06/09/18 [phone]

## 2019-01-21 ENCOUNTER — Other Ambulatory Visit: Payer: Self-pay

## 2019-01-21 ENCOUNTER — Emergency Department: Payer: Medicare HMO

## 2019-01-21 ENCOUNTER — Emergency Department
Admission: EM | Admit: 2019-01-21 | Discharge: 2019-01-21 | Disposition: A | Discharge status: Home / Self Care | Payer: Medicare HMO | Attending: Emergency Medicine | Admitting: Emergency Medicine

## 2019-01-21 DIAGNOSIS — R05 Cough: Secondary | ICD-10-CM | POA: Diagnosis present

## 2019-01-21 DIAGNOSIS — J40 Bronchitis, not specified as acute or chronic: Secondary | ICD-10-CM | POA: Insufficient documentation

## 2019-01-21 MED ORDER — PREDNISONE 50 MG PO TABS: ORAL_TABLET | ORAL | 0 refills | Status: DC

## 2019-01-21 MED ORDER — ALBUTEROL SULFATE HFA 108 (90 BASE) MCG/ACT IN AERS: 2.0000 | INHALATION_SPRAY | Freq: Four times a day (QID) | RESPIRATORY_TRACT | 2 refills | Status: DC | PRN

## 2019-01-21 MED ORDER — IPRATROPIUM-ALBUTEROL 0.5-2.5 (3) MG/3ML IN SOLN
3.0000 mL | Freq: Once | RESPIRATORY_TRACT | Status: AC
  Administered 2019-01-21: 3 mL via RESPIRATORY_TRACT
  Filled 2019-01-21: qty 3

## 2019-01-21 MED ORDER — PREDNISONE 20 MG PO TABS
60.0000 mg | ORAL_TABLET | Freq: Once | ORAL | Status: AC
  Administered 2019-01-21: 60 mg via ORAL
  Filled 2019-01-21: qty 3

## 2019-01-21 MED ORDER — HYDROCOD POLST-CPM POLST ER 10-8 MG/5ML PO SUER: 5.0000 mL | Freq: Two times a day (BID) | ORAL | 0 refills | Status: AC

## 2019-01-21 NOTE — ED Triage Notes (Addendum)
Patient ambulatory to triage with steady gait, without difficulty or distress noted, mask in place; pt reports noncough since Tuesday; denies any accomp symptoms; rx zpak on Thursday but no xray performed

## 2019-01-21 NOTE — ED Provider Notes (Signed)
[facility] Emergency Department Provider Note  ____________________________________________  Time seen: Approximately 10:01 PM  I have reviewed the triage vital signs and the nursing notes.   HISTORY  Chief Complaint Cough    HPI Derrick Mckenzie is a 68 y.o. male presents to the emergency department with nonproductive cough for the past week.  Patient has had wheezing and shortness of breath with cough at home.  Patient denies fatigue or fever.  He has had one prior episode of community-acquired pneumonia several years ago.  He denies chest pain, chest tightness or abdominal pain.  No associated rhinorrhea or nasal congestion.  Patient was seen and evaluated by his primary care provider who prescribed him azithromycin.  Patient reports that he is on his fifth day of azithromycin is not any better.  Patient also states that the cough has kept him from sleeping at night and he cannot rest.   Past Medical History:  Diagnosis Date  . Arthritis    knees  . Complication of anesthesia    aggitation after knee surgery  . Family history of adverse reaction to anesthesia    brother - hallucinations  . GERD (gastroesophageal reflux disease)   . Hx of smoking 06/26/2015  . Hyperlipidemia   . Hypertension   . Hypothyroidism   . Kidney stone   . Morbid obesity ([place])   . Murmur   . Prediabetes 10/14/2016  . Sleep apnea    score of 5 on apnea screen    Patient Active Problem List   Diagnosis Date Noted  . Prostate cancer screening 03/16/2017  . Full code status 03/16/2017  . Preventative health care 03/16/2017  . Need for hepatitis C screening test 10/14/2016  . Prediabetes 10/14/2016  . Peptic ulcer of stomach   . Calculus of gallbladder without cholecystitis without obstruction 09/27/2016  . Right nephrolithiasis 09/27/2016  . Status post right knee replacement 09/08/2016  . Medication monitoring encounter 10/30/2015  . Sleep pattern disturbance  06/26/2015  . Hx of smoking 06/26/2015  . Need for prophylactic vaccination against Streptococcus pneumoniae (pneumococcus) 06/26/2015  . Hypertension   . Hyperlipidemia   . Morbid obesity ([place])   . Hypothyroidism   . Degenerative arthritis of knee, bilateral 06/12/2015  . Chest pain, atypical 02/04/2015    Past Surgical History:  Procedure Laterality Date  . ESOPHAGOGASTRODUODENOSCOPY (EGD) WITH PROPOFOL N/A 10/05/2016   Procedure: ESOPHAGOGASTRODUODENOSCOPY (EGD) WITH PROPOFOL;  Surgeon: Jonathon Bellows, MD;  Location: [place];  Service: Endoscopy;  Laterality: N/A;  sleep apnea  . HERNIA REPAIR    . REPLACEMENT TOTAL KNEE Right 2017  . TOTAL KNEE ARTHROPLASTY  1973    Prior to Admission medications   Medication Sig Start Date End Date Taking? Authorizing Provider  atorvastatin (LIPITOR) 20 MG tablet Take 20 mg by mouth daily.   Yes [provider]  metFORMIN (GLUCOPHAGE) 500 MG tablet Take by mouth 2 (two) times daily with a meal.   Yes [provider]  albuterol (PROVENTIL HFA;VENTOLIN HFA) 108 (90 Base) MCG/ACT inhaler Inhale 2 puffs into the lungs every 6 (six) hours as needed for wheezing or shortness of breath. 01/21/19   Lannie Fields, PA-C  amLODipine (NORVASC) 10 MG tablet TAKE 1 TABLET (10 MG TOTAL) BY MOUTH DAILY. 11/08/16   Arnetha Courser, MD  chlorpheniramine-HYDROcodone (TUSSIONEX PENNKINETIC ER) 10-8 MG/5ML SUER Take 5 mLs by mouth 2 (two) times daily for 5 days. 01/21/19 01/26/19  Lannie Fields, PA-C  cyclobenzaprine (FLEXERIL)  10 MG tablet Take 1 tablet (10 mg total) by mouth 3 (three) times daily as needed for muscle spasms. 06/08/18   Darel Hong, MD  hydrochlorothiazide (HYDRODIURIL) 12.5 MG tablet Take 1 tablet (12.5 mg total) by mouth daily. 04/12/17   Arnetha Courser, MD  Krill Oil 1000 MG CAPS Take by mouth.    [provider]  levothyroxine (SYNTHROID, LEVOTHROID) 88 MCG tablet Take 1 tablet (88 mcg total) by mouth daily.  03/16/17   Arnetha Courser, MD  Phenylephrine-Acetaminophen 5-325 MG TABS  03/14/17   [provider]  predniSONE (DELTASONE) 50 MG tablet Take one 50 mg tablet once daily for the next five days. 01/21/19   Lannie Fields, PA-C    Allergies Patient has no known allergies.  Family History  Problem Relation Age of Onset  . Cancer Mother        leukemia  . Diabetes Brother   . Blindness Brother   . Heart attack Maternal Grandfather   . Prostate cancer Neg Hx   . Bladder Cancer Neg Hx   . Kidney cancer Neg Hx     Social History Social History   Tobacco Use  . Smoking status: Former Smoker    Packs/day: 0.00    Years: 10.00    Pack years: 0.00    Types: Cigarettes    Last attempt to quit: 02/03/2016    Years since quitting: 2.9  . Smokeless tobacco: Never Used  Substance Use Topics  . Alcohol use: Yes    Alcohol/week: 0.0 standard drinks    Comment: socially - Holidays  . Drug use: No     Review of Systems  Constitutional: No fever/chills Eyes: No visual changes. No discharge ENT: No upper respiratory complaints. Cardiovascular: no chest pain. Respiratory: Patient has cough. No SOB. Gastrointestinal: No abdominal pain.  No nausea, no vomiting.  No diarrhea.  No constipation. Musculoskeletal: Negative for musculoskeletal pain. Skin: Negative for rash, abrasions, lacerations, ecchymosis. Neurological: Negative for headaches, focal weakness or numbness.  ____________________________________________   PHYSICAL EXAM:  VITAL SIGNS: ED Triage Vitals  Enc Vitals Group     BP 01/21/19 2029 (!) 160/90     Pulse Rate 01/21/19 2029 84     Resp 01/21/19 2029 18     Temp 01/21/19 2029 98.9 F (37.2 C)     Temp Source 01/21/19 2029 Oral     SpO2 01/21/19 2029 93 %     Weight 01/21/19 2030 (!) 305 lb (138.3 kg)     Height 01/21/19 2030 5\' 8"  (1.727 m)     Head Circumference --      Peak Flow --      Pain Score 01/21/19 2029 0     Pain Loc --      Pain Edu? --       Excl. in [place]? --      Constitutional: Alert and oriented. Well appearing and in no acute distress. Eyes: Conjunctivae are normal. PERRL. EOMI. Head: Atraumatic. ENT:      Ears: TMs are pearly.      Nose: No congestion/rhinnorhea.      Mouth/Throat: Mucous membranes are moist.  Neck: No stridor.  No cervical spine tenderness to palpation. Cardiovascular: Normal rate, regular rhythm. Normal S1 and S2.  Good peripheral circulation. Respiratory: Normal respiratory effort without tachypnea or retractions.  Patient has diffuse wheezing auscultated bilaterally.  Good air entry to the bases with no decreased or absent breath sounds. Gastrointestinal: Bowel sounds 4 quadrants.  Soft and nontender to palpation. No guarding or rigidity. No palpable masses. No distention. No CVA tenderness. Musculoskeletal: Full range of motion to all extremities. No gross deformities appreciated. Neurologic:  Normal speech and language. No gross focal neurologic deficits are appreciated.  Skin:  Skin is warm, dry and intact. No rash noted. Psychiatric: Mood and affect are normal. Speech and behavior are normal. Patient exhibits appropriate insight and judgement.   ____________________________________________   LABS (all labs ordered are listed, but only abnormal results are displayed)  Labs Reviewed - No data to display ____________________________________________  EKG   ____________________________________________  RADIOLOGY I personally viewed and evaluated these images as part of my medical decision making, as well as reviewing the written report by the radiologist.  Dg Chest 2 View  Result Date: 01/21/2019 CLINICAL DATA:  Persistent cough EXAM: CHEST - 2 VIEW COMPARISON:  11/04/2014 FINDINGS: Normal heart size. Similar chronic bronchitic changes and interstitial prominence remain nonspecific. No definite focal pneumonia, collapse or consolidation. Negative for edema, effusion or pneumothorax.  Trachea midline. Aorta atherosclerotic. Scoliosis of the spine noted. IMPRESSION: Chronic bronchitic change and interstitial prominence. No significant new finding by plain radiography. Aortic atherosclerosis Electronically Signed   By: Jerilynn Mages.  Shick M.D.   On: 01/21/2019 21:02    ____________________________________________    PROCEDURES  Procedure(s) performed:    Procedures    Medications  ipratropium-albuterol (DUONEB) 0.5-2.5 (3) MG/3ML nebulizer solution 3 mL (3 mLs Nebulization Given 01/21/19 2117)  ipratropium-albuterol (DUONEB) 0.5-2.5 (3) MG/3ML nebulizer solution 3 mL (3 mLs Nebulization Given 01/21/19 2159)  predniSONE (DELTASONE) tablet 60 mg (60 mg Oral Given 01/21/19 2159)     ____________________________________________   INITIAL IMPRESSION / ASSESSMENT AND PLAN / ED COURSE  Pertinent labs & imaging results that were available during my care of the patient were reviewed by me and considered in my medical decision making (see chart for details).  Review of the [place] CSRS was performed in accordance of the [place] prior to dispensing any controlled drugs.    Assessment and plan Acute bronchitis Patient presents to the emergency department with nonproductive cough and wheezing for 1 week.  Patient was given 2 DuoNeb breathing treatments in the emergency department wheezing resolved.  Patient has been treated for the past 5 days with azithromycin and reports that her symptoms are not better.  Patient was discharged with prednisone and Tussionex.  He was advised to follow-up with primary care as needed.  Strict return precautions were given to return to the emergency department for new or worsening symptoms.  All patient questions were answered.     ____________________________________________  FINAL CLINICAL IMPRESSION(S) / ED DIAGNOSES  Final diagnoses:  Bronchitis      NEW MEDICATIONS STARTED DURING THIS VISIT:  ED Discharge Orders         Ordered     chlorpheniramine-HYDROcodone (TUSSIONEX PENNKINETIC ER) 10-8 MG/5ML SUER  2 times daily     01/21/19 2153    predniSONE (DELTASONE) 50 MG tablet     01/21/19 2153    albuterol (PROVENTIL HFA;VENTOLIN HFA) 108 (90 Base) MCG/ACT inhaler  Every 6 hours PRN     01/21/19 2153              This chart was dictated using voice recognition software/Dragon. Despite best efforts to proofread, errors can occur which can change the meaning. Any change was purely unintentional.    Lannie Fields, PA-C 01/21/19 2204    Harvest Dark, MD 01/22/19 [phone]

## 2019-05-06 ENCOUNTER — Other Ambulatory Visit (INDEPENDENT_AMBULATORY_CARE_PROVIDER_SITE_OTHER): Payer: Self-pay | Admitting: Vascular Surgery

## 2019-05-06 DIAGNOSIS — I723 Aneurysm of iliac artery: Secondary | ICD-10-CM

## 2019-05-06 DIAGNOSIS — I77811 Abdominal aortic ectasia: Secondary | ICD-10-CM

## 2019-05-07 ENCOUNTER — Ambulatory Visit (INDEPENDENT_AMBULATORY_CARE_PROVIDER_SITE_OTHER): Payer: Medicare HMO

## 2019-05-07 ENCOUNTER — Other Ambulatory Visit: Payer: Self-pay

## 2019-05-07 ENCOUNTER — Encounter (INDEPENDENT_AMBULATORY_CARE_PROVIDER_SITE_OTHER): Payer: Self-pay | Admitting: Vascular Surgery

## 2019-05-07 ENCOUNTER — Ambulatory Visit (INDEPENDENT_AMBULATORY_CARE_PROVIDER_SITE_OTHER): Payer: Medicare HMO | Admitting: Vascular Surgery

## 2019-05-07 VITALS — BP 117/75 | HR 60 | Resp 12 | Ht 69.0 in | Wt 301.0 lb

## 2019-05-07 DIAGNOSIS — Z87891 Personal history of nicotine dependence: Secondary | ICD-10-CM | POA: Diagnosis not present

## 2019-05-07 DIAGNOSIS — I723 Aneurysm of iliac artery: Secondary | ICD-10-CM

## 2019-05-07 DIAGNOSIS — I10 Essential (primary) hypertension: Secondary | ICD-10-CM | POA: Diagnosis not present

## 2019-05-07 DIAGNOSIS — E782 Mixed hyperlipidemia: Secondary | ICD-10-CM | POA: Diagnosis not present

## 2019-05-07 DIAGNOSIS — I77811 Abdominal aortic ectasia: Secondary | ICD-10-CM | POA: Diagnosis not present

## 2019-05-07 DIAGNOSIS — Z79899 Other long term (current) drug therapy: Secondary | ICD-10-CM

## 2019-05-07 NOTE — Assessment & Plan Note (Signed)
We performed a follow-up duplex of the aorta and iliac arteries today.  The maximal aortic diameter was only 2.2 cm.  The largest iliac artery diameter was measured at 1.4 cm on the right and about 1.2 cm on the left.  His size and some bowel gas made this a technically difficult study.   Based off this and my review of his previous CT, he either has a very small iliac artery aneurysm or ectasia bordering on aneurysmal status.  I think a repeat study in 1 year would be appropriate, and if size is remained stable we may be able to go to an every other year check.

## 2019-05-07 NOTE — Assessment & Plan Note (Signed)
blood pressure control important in reducing the progression of atherosclerotic disease and aneurysmal growth. On appropriate oral medications.  

## 2019-05-07 NOTE — Assessment & Plan Note (Signed)
lipid control important in reducing the progression of atherosclerotic disease. Continue statin therapy  

## 2019-05-07 NOTE — Progress Notes (Signed)
Patient ID: Derrick Mckenzie, male   DOB: 06-21-1951, 68 y.o.   MRN: [id number]  Chief Complaint  Patient presents with  . New Patient (Initial Visit)    HPI Derrick Mckenzie is a 68 y.o. male.  I am asked to see the patient by Dr. Pricilla Riffle for evaluation of iliac artery aneurysm.  The patient is undergone a CT scan last year that I have independently reviewed.  The radiologist did not describe an abdominal aortic or iliac artery aneurysms, but on close review the iliac arteries are ectatic and borderline aneurysmal in the 1.5 cm range.  The patient denies any aneurysm related symptoms. Specifically, the patient denies new back or abdominal pain, or signs of peripheral embolization.  We performed a follow-up duplex of the aorta and iliac arteries today.  The maximal aortic diameter was only 2.2 cm.  The largest iliac artery diameter was measured at 1.4 cm on the right and about 1.2 cm on the left.  His size and some bowel gas made this a technically difficult study.     Past Medical History:  Diagnosis Date  . Arthritis    knees  . Complication of anesthesia    aggitation after knee surgery  . Family history of adverse reaction to anesthesia    brother - hallucinations  . GERD (gastroesophageal reflux disease)   . Hx of smoking 06/26/2015  . Hyperlipidemia   . Hypertension   . Hypothyroidism   . Kidney stone   . Morbid obesity ([place])   . Murmur   . Prediabetes 10/14/2016  . Sleep apnea    score of 5 on apnea screen    Past Surgical History:  Procedure Laterality Date  . ESOPHAGOGASTRODUODENOSCOPY (EGD) WITH PROPOFOL N/A 10/05/2016   Procedure: ESOPHAGOGASTRODUODENOSCOPY (EGD) WITH PROPOFOL;  Surgeon: Jonathon Bellows, MD;  Location: [place];  Service: Endoscopy;  Laterality: N/A;  sleep apnea  . HERNIA REPAIR    . REPLACEMENT TOTAL KNEE Right 2017  . TOTAL KNEE ARTHROPLASTY  1973    Family History Family History  Problem Relation Age of Onset  . Cancer Mother     leukemia  . Diabetes Brother   . Blindness Brother   . Heart attack Maternal Grandfather   . Prostate cancer Neg Hx   . Bladder Cancer Neg Hx   . Kidney cancer Neg Hx     Social History Social History   Tobacco Use  . Smoking status: Former Smoker    Packs/day: 0.00    Years: 10.00    Pack years: 0.00    Types: Cigarettes    Last attempt to quit: 02/03/2016    Years since quitting: 3.2  . Smokeless tobacco: Never Used  Substance Use Topics  . Alcohol use: Yes    Alcohol/week: 0.0 standard drinks    Comment: socially - Holidays  . Drug use: No    No Known Allergies  Current Outpatient Medications  Medication Sig Dispense Refill  . albuterol (PROVENTIL HFA;VENTOLIN HFA) 108 (90 Base) MCG/ACT inhaler Inhale 2 puffs into the lungs every 6 (six) hours as needed for wheezing or shortness of breath. 1 Inhaler 2  . amLODipine (NORVASC) 10 MG tablet TAKE 1 TABLET (10 MG TOTAL) BY MOUTH DAILY. 90 tablet 3  . atorvastatin (LIPITOR) 20 MG tablet Take 20 mg by mouth daily.    . hydrochlorothiazide (HYDRODIURIL) 12.5 MG tablet Take 1 tablet (12.5 mg total) by mouth daily. 90 tablet 3  . Krill Oil 1000  MG CAPS Take by mouth.    . levothyroxine (SYNTHROID, LEVOTHROID) 88 MCG tablet Take 1 tablet (88 mcg total) by mouth daily. 90 tablet 1  . metFORMIN (GLUCOPHAGE) 500 MG tablet Take by mouth 2 (two) times daily with a meal.    . Multiple Vitamin (MULTIVITAMIN WITH MINERALS) TABS tablet Take 1 tablet by mouth daily.    . cyclobenzaprine (FLEXERIL) 10 MG tablet Take 1 tablet (10 mg total) by mouth 3 (three) times daily as needed for muscle spasms. (Patient not taking: Reported on 05/07/2019) 30 tablet 0  . Phenylephrine-Acetaminophen 5-325 MG TABS     . predniSONE (DELTASONE) 50 MG tablet Take one 50 mg tablet once daily for the next five days. (Patient not taking: Reported on 05/07/2019) 5 tablet 0   No current facility-administered medications for this visit.       REVIEW OF SYSTEMS  (Negative unless checked)  Constitutional: [] Weight loss  [] Fever  [] Chills Cardiac: [] Chest pain   [] Chest pressure   [] Palpitations   [] Shortness of breath when laying flat   [] Shortness of breath at rest   [] Shortness of breath with exertion. Vascular:  [] Pain in legs with walking   [] Pain in legs at rest   [] Pain in legs when laying flat   [] Claudication   [] Pain in feet when walking  [] Pain in feet at rest  [] Pain in feet when laying flat   [] History of DVT   [] Phlebitis   [] Swelling in legs   [] Varicose veins   [] Non-healing ulcers Pulmonary:   [] Uses home oxygen   [] Productive cough   [] Hemoptysis   [] Wheeze  [] COPD   [] Asthma Neurologic:  [] Dizziness  [] Blackouts   [] Seizures   [] History of stroke   [] History of TIA  [] Aphasia   [] Temporary blindness   [] Dysphagia   [] Weakness or numbness in arms   [] Weakness or numbness in legs Musculoskeletal:  [x] Arthritis   [] Joint swelling   [x] Joint pain   [] Low back pain Hematologic:  [] Easy bruising  [] Easy bleeding   [] Hypercoagulable state   [] Anemic  [] Hepatitis Gastrointestinal:  [] Blood in stool   [] Vomiting blood  [x] Gastroesophageal reflux/heartburn   [] Abdominal pain Genitourinary:  [] Chronic kidney disease   [] Difficult urination  [] Frequent urination  [] Burning with urination   [] Hematuria Skin:  [] Rashes   [] Ulcers   [] Wounds Psychological:  [] History of anxiety   []  History of major depression.    Physical Exam BP 117/75 (BP Location: Left Arm, Patient Position: Sitting, Cuff Size: Large)   Pulse 60   Resp 12   Ht 5\' 9"  (1.753 m)   Wt (!) 301 lb (136.5 kg)   BMI 44.45 kg/m  Gen:  WD/WN, NAD. Obese  Head: [place]/AT, No temporalis wasting. Ear/Nose/Throat: Hearing grossly intact, nares w/o erythema or drainage, oropharynx w/o Erythema/Exudate Eyes: Conjunctiva clear, sclera non-icteric  Neck: trachea midline.  No JVD.  Pulmonary:  Good air movement, respirations not labored, no use of accessory muscles  Cardiac: RRR, no JVD  Vascular:  Vessel Right Left  Radial Palpable Palpable                                   Gastrointestinal:. No masses, surgical incisions, or scars. Musculoskeletal: M/S 5/5 throughout.  Extremities without ischemic changes.  No deformity or atrophy.  Neurologic: Sensation grossly intact in extremities.  Symmetrical.  Speech is fluent. Motor exam as listed above. Psychiatric: Judgment intact, Mood & affect  appropriate for pt's clinical situation. Dermatologic: No rashes or ulcers noted.  No cellulitis or open wounds.    Radiology No results found.  Labs No results found for this or any previous visit (from the past 2160 hour(s)).  Assessment/Plan:  Hypertension blood pressure control important in reducing the progression of atherosclerotic disease and aneurysmal growth. On appropriate oral medications.   Hyperlipidemia lipid control important in reducing the progression of atherosclerotic disease. Continue statin therapy   Iliac artery aneurysm (HCC) We performed a follow-up duplex of the aorta and iliac arteries today.  The maximal aortic diameter was only 2.2 cm.  The largest iliac artery diameter was measured at 1.4 cm on the right and about 1.2 cm on the left.  His size and some bowel gas made this a technically difficult study.   Based off this and my review of his previous CT, he either has a very small iliac artery aneurysm or ectasia bordering on aneurysmal status.  I think a repeat study in 1 year would be appropriate, and if size is remained stable we may be able to go to an every other year check.      Leotis Pain 05/07/2019, 9:39 AM   This note was created with Dragon medical transcription system.  Any errors from dictation are unintentional.

## 2019-05-07 NOTE — Patient Instructions (Signed)
Abdominal Aortic Aneurysm    An aneurysm is a bulge in one of the blood vessels that carry blood away from the heart (artery). It happens when blood pushes up against a weak or damaged place in the wall of an artery. An abdominal aortic aneurysm happens in the main artery of the body (aorta).  Some aneurysms may not cause problems. If it grows, it can burst or tear, causing bleeding inside the body. This is an emergency. It needs to be treated right away.  What are the causes?  The exact cause of this condition is not known.  What increases the risk?  The following may make you more likely to get this condition:   Being a male who is 60 years of age or older.   Being white (Caucasian).   Using tobacco.   Having a family history of aneurysms.   Having the following conditions:  ? Hardening of the arteries (arteriosclerosis).  ? Inflammation of the walls of an artery (arteritis).  ? Certain genetic conditions.  ? Being very overweight (obesity).  ? An infection in the wall of the aorta (infectious aortitis).  ? High cholesterol.  ? High blood pressure (hypertension).  What are the signs or symptoms?  Symptoms depend on the size of the aneurysm and how fast it is growing. Most grow slowly and do not cause any symptoms. If symptoms do occur, they may include:   Pain in the belly (abdomen), side, or back.   Feeling full after eating only small amounts of food.   Feeling a throbbing lump in the belly.  Symptoms that the aneurysm has burst (ruptured) include:   Sudden, very bad pain in the belly, side, or back.   Feeling sick to your stomach (nauseous).   Throwing up (vomiting).   Feeling light-headed or passing out.  How is this treated?  Treatment for this condition depends on:   The size of the aneurysm.   How fast it is growing.   Your age.   Your risk of having it burst.  If your aneurysm is smaller than 2 inches (5 cm), your doctor may manage it by:   Checking it often to see if it is getting bigger.  You may have an imaging test (ultrasound) to check it every 3-6 months, every year, or every few years.   Giving you medicines to:  ? Control blood pressure.  ? Treat pain.  ? Fight infection.  If your aneurysm is larger than 2 inches (5 cm), you may need surgery to fix it.  Follow these instructions at home:  Lifestyle   Do not use any products that have nicotine or tobacco in them. This includes cigarettes, e-cigarettes, and chewing tobacco. If you need help quitting, ask your doctor.   Get regular exercise. Ask your doctor what types of exercise are best for you.  Eating and drinking   Eat a heart-healthy diet. This includes eating plenty of:  ? Fresh fruits and vegetables.  ? Whole grains.  ? Low-fat (lean) protein.  ? Low-fat dairy products.   Avoid foods that are high in saturated fat and cholesterol. These foods include red meat and some dairy products.   Do not drink alcohol if:  ? Your doctor tells you not to drink.  ? You are pregnant, may be pregnant, or are planning to become pregnant.   If you drink alcohol:  ? Limit how much you use to:   0-1 drink a day for women.     0-2 drinks a day for men.  ? Be aware of how much alcohol is in your drink. In the [place] one drink equals any of these:   One typical bottle of beer (12 oz).   One-half glass of wine (5 oz).   One shot of hard liquor (1 oz).  General instructions   Take over-the-counter and prescription medicines only as told by your doctor.   Keep your blood pressure within normal limits. Ask your doctor what your blood pressure should be.   Have your blood sugar (glucose) level and cholesterol levels checked regularly. Keep your blood sugar level and cholesterol levels within normal limits.   Avoid heavy lifting and activities that take a lot of effort. Ask your doctor what activities are safe for you.   Keep all follow-up visits as told by your doctor. This is important.  ? Talk to your doctor about regular screenings to see if the  aneurysm is getting bigger.  Contact a doctor if you:   Have pain in your belly, side, or back.   Have a throbbing feeling in your belly.   Have a family history of aneurysms.  Get help right away if you:   Have sudden, bad pain in your belly, side, or back.   Feel sick to your stomach.   Throw up.   Have trouble pooping (constipation).   Have trouble peeing (urinating).   Feel light-headed.   Have a fast heart rate when you stand.   Have sweaty skin that is cold to the touch (clammy).   Have shortness of breath.   Have a fever.  These symptoms may be an emergency. Do not wait to see if the symptoms will go away. Get medical help right away. Call your local emergency services (911 in the [place] Do not drive yourself to the hospital.  Summary   An aneurysm is a bulge in one of the blood vessels that carry blood away from the heart (artery). Some aneurysms may not cause problems.   You may need to have yours checked often. If it grows, it can burst or tear. This causes bleeding inside the body. It needs to be treated right away.   Follow instructions from your doctor about healthy lifestyle changes.   Keep all follow-up visits as told by your doctor. This is important.  This information is not intended to replace advice given to you by your health care provider. Make sure you discuss any questions you have with your health care provider.  Document Released: 03/11/2013 Document Revised: 06/23/2018 Document Reviewed: 06/23/2018  Elsevier Interactive Patient Education  2019 [employer]

## 2019-12-23 DIAGNOSIS — Z8673 Personal history of transient ischemic attack (TIA), and cerebral infarction without residual deficits: Secondary | ICD-10-CM | POA: Insufficient documentation

## 2019-12-31 ENCOUNTER — Other Ambulatory Visit: Payer: Self-pay | Admitting: Cardiology

## 2019-12-31 DIAGNOSIS — G459 Transient cerebral ischemic attack, unspecified: Secondary | ICD-10-CM

## 2019-12-31 DIAGNOSIS — I10 Essential (primary) hypertension: Secondary | ICD-10-CM

## 2020-01-07 DIAGNOSIS — I350 Nonrheumatic aortic (valve) stenosis: Secondary | ICD-10-CM | POA: Diagnosis not present

## 2020-01-07 DIAGNOSIS — G459 Transient cerebral ischemic attack, unspecified: Secondary | ICD-10-CM | POA: Diagnosis not present

## 2020-01-07 DIAGNOSIS — I10 Essential (primary) hypertension: Secondary | ICD-10-CM | POA: Diagnosis not present

## 2020-01-07 DIAGNOSIS — I6521 Occlusion and stenosis of right carotid artery: Secondary | ICD-10-CM | POA: Diagnosis not present

## 2020-01-10 ENCOUNTER — Ambulatory Visit
Admission: RE | Admit: 2020-01-10 | Discharge: 2020-01-10 | Disposition: A | Discharge status: Home / Self Care | Payer: Medicare HMO | Source: Ambulatory Visit | Attending: Cardiology | Admitting: Cardiology

## 2020-01-10 ENCOUNTER — Other Ambulatory Visit: Payer: Self-pay

## 2020-01-10 ENCOUNTER — Other Ambulatory Visit: Payer: Self-pay | Admitting: Cardiology

## 2020-01-10 DIAGNOSIS — G459 Transient cerebral ischemic attack, unspecified: Secondary | ICD-10-CM | POA: Insufficient documentation

## 2020-01-10 DIAGNOSIS — I10 Essential (primary) hypertension: Secondary | ICD-10-CM | POA: Diagnosis not present

## 2020-01-10 DIAGNOSIS — R200 Anesthesia of skin: Secondary | ICD-10-CM | POA: Diagnosis not present

## 2020-01-10 DIAGNOSIS — R4701 Aphasia: Secondary | ICD-10-CM | POA: Diagnosis not present

## 2020-01-10 LAB — POCT I-STAT CREATININE: Creatinine, Ser: 1.3 mg/dL — ABNORMAL HIGH (ref 0.61–1.24)

## 2020-01-10 MED ORDER — GADOBUTROL 1 MMOL/ML IV SOLN: 10.0000 mL | Freq: Once | INTRAVENOUS | Status: DC | PRN

## 2020-01-20 DIAGNOSIS — M1711 Unilateral primary osteoarthritis, right knee: Secondary | ICD-10-CM | POA: Diagnosis not present

## 2020-01-20 DIAGNOSIS — Z96651 Presence of right artificial knee joint: Secondary | ICD-10-CM | POA: Diagnosis not present

## 2020-01-20 DIAGNOSIS — K257 Chronic gastric ulcer without hemorrhage or perforation: Secondary | ICD-10-CM | POA: Diagnosis not present

## 2020-01-20 DIAGNOSIS — L853 Xerosis cutis: Secondary | ICD-10-CM | POA: Diagnosis not present

## 2020-01-20 DIAGNOSIS — E782 Mixed hyperlipidemia: Secondary | ICD-10-CM | POA: Diagnosis not present

## 2020-01-20 DIAGNOSIS — E039 Hypothyroidism, unspecified: Secondary | ICD-10-CM | POA: Diagnosis not present

## 2020-01-20 DIAGNOSIS — E119 Type 2 diabetes mellitus without complications: Secondary | ICD-10-CM | POA: Diagnosis not present

## 2020-01-20 DIAGNOSIS — I350 Nonrheumatic aortic (valve) stenosis: Secondary | ICD-10-CM | POA: Diagnosis not present

## 2020-01-20 DIAGNOSIS — I10 Essential (primary) hypertension: Secondary | ICD-10-CM | POA: Diagnosis not present

## 2020-01-20 DIAGNOSIS — Z7689 Persons encountering health services in other specified circumstances: Secondary | ICD-10-CM | POA: Diagnosis not present

## 2020-01-20 DIAGNOSIS — N200 Calculus of kidney: Secondary | ICD-10-CM | POA: Diagnosis not present

## 2020-01-20 DIAGNOSIS — K8020 Calculus of gallbladder without cholecystitis without obstruction: Secondary | ICD-10-CM | POA: Diagnosis not present

## 2020-01-20 DIAGNOSIS — Z7189 Other specified counseling: Secondary | ICD-10-CM | POA: Diagnosis not present

## 2020-02-10 DIAGNOSIS — J019 Acute sinusitis, unspecified: Secondary | ICD-10-CM | POA: Diagnosis not present

## 2020-02-10 DIAGNOSIS — R05 Cough: Secondary | ICD-10-CM | POA: Diagnosis not present

## 2020-02-14 DIAGNOSIS — I10 Essential (primary) hypertension: Secondary | ICD-10-CM | POA: Diagnosis not present

## 2020-02-14 DIAGNOSIS — I350 Nonrheumatic aortic (valve) stenosis: Secondary | ICD-10-CM | POA: Diagnosis not present

## 2020-02-14 DIAGNOSIS — E119 Type 2 diabetes mellitus without complications: Secondary | ICD-10-CM | POA: Diagnosis not present

## 2020-02-14 DIAGNOSIS — R600 Localized edema: Secondary | ICD-10-CM | POA: Diagnosis not present

## 2020-02-14 DIAGNOSIS — Z87891 Personal history of nicotine dependence: Secondary | ICD-10-CM | POA: Diagnosis not present

## 2020-02-14 DIAGNOSIS — G459 Transient cerebral ischemic attack, unspecified: Secondary | ICD-10-CM | POA: Diagnosis not present

## 2020-02-14 DIAGNOSIS — R05 Cough: Secondary | ICD-10-CM | POA: Diagnosis not present

## 2020-02-17 DIAGNOSIS — R918 Other nonspecific abnormal finding of lung field: Secondary | ICD-10-CM | POA: Diagnosis not present

## 2020-02-17 DIAGNOSIS — Z01810 Encounter for preprocedural cardiovascular examination: Secondary | ICD-10-CM | POA: Diagnosis not present

## 2020-02-17 DIAGNOSIS — I350 Nonrheumatic aortic (valve) stenosis: Secondary | ICD-10-CM | POA: Diagnosis not present

## 2020-02-17 DIAGNOSIS — I2510 Atherosclerotic heart disease of native coronary artery without angina pectoris: Secondary | ICD-10-CM | POA: Diagnosis not present

## 2020-02-20 DIAGNOSIS — I10 Essential (primary) hypertension: Secondary | ICD-10-CM | POA: Diagnosis not present

## 2020-02-20 DIAGNOSIS — R001 Bradycardia, unspecified: Secondary | ICD-10-CM | POA: Diagnosis not present

## 2020-02-20 DIAGNOSIS — I2510 Atherosclerotic heart disease of native coronary artery without angina pectoris: Secondary | ICD-10-CM | POA: Diagnosis not present

## 2020-02-20 DIAGNOSIS — I723 Aneurysm of iliac artery: Secondary | ICD-10-CM | POA: Diagnosis not present

## 2020-02-20 DIAGNOSIS — E119 Type 2 diabetes mellitus without complications: Secondary | ICD-10-CM | POA: Diagnosis not present

## 2020-02-20 DIAGNOSIS — I350 Nonrheumatic aortic (valve) stenosis: Secondary | ICD-10-CM | POA: Diagnosis not present

## 2020-02-20 DIAGNOSIS — E785 Hyperlipidemia, unspecified: Secondary | ICD-10-CM | POA: Diagnosis not present

## 2020-02-20 DIAGNOSIS — I2109 ST elevation (STEMI) myocardial infarction involving other coronary artery of anterior wall: Secondary | ICD-10-CM | POA: Diagnosis not present

## 2020-02-20 DIAGNOSIS — I119 Hypertensive heart disease without heart failure: Secondary | ICD-10-CM | POA: Diagnosis not present

## 2020-02-20 DIAGNOSIS — E039 Hypothyroidism, unspecified: Secondary | ICD-10-CM | POA: Diagnosis not present

## 2020-02-20 DIAGNOSIS — I6381 Other cerebral infarction due to occlusion or stenosis of small artery: Secondary | ICD-10-CM | POA: Diagnosis not present

## 2020-02-26 ENCOUNTER — Other Ambulatory Visit: Payer: Self-pay

## 2020-02-26 ENCOUNTER — Ambulatory Visit: Payer: Medicare HMO | Admitting: Gastroenterology

## 2020-02-26 ENCOUNTER — Inpatient Hospital Stay
Admission: EM | Admit: 2020-02-26 | Discharge: 2020-02-28 | DRG: 065 | Disposition: A | Discharge status: Home / Self Care | Payer: Medicare HMO | Attending: Family Medicine | Admitting: Family Medicine

## 2020-02-26 ENCOUNTER — Inpatient Hospital Stay: Payer: Medicare HMO

## 2020-02-26 ENCOUNTER — Emergency Department: Payer: Medicare HMO

## 2020-02-26 DIAGNOSIS — E785 Hyperlipidemia, unspecified: Secondary | ICD-10-CM | POA: Diagnosis present

## 2020-02-26 DIAGNOSIS — I2510 Atherosclerotic heart disease of native coronary artery without angina pectoris: Secondary | ICD-10-CM | POA: Diagnosis present

## 2020-02-26 DIAGNOSIS — I63532 Cerebral infarction due to unspecified occlusion or stenosis of left posterior cerebral artery: Principal | ICD-10-CM | POA: Diagnosis present

## 2020-02-26 DIAGNOSIS — I639 Cerebral infarction, unspecified: Secondary | ICD-10-CM

## 2020-02-26 DIAGNOSIS — Z79890 Hormone replacement therapy (postmenopausal): Secondary | ICD-10-CM | POA: Diagnosis not present

## 2020-02-26 DIAGNOSIS — Z79899 Other long term (current) drug therapy: Secondary | ICD-10-CM | POA: Diagnosis not present

## 2020-02-26 DIAGNOSIS — I6389 Other cerebral infarction: Secondary | ICD-10-CM | POA: Diagnosis not present

## 2020-02-26 DIAGNOSIS — I350 Nonrheumatic aortic (valve) stenosis: Secondary | ICD-10-CM | POA: Diagnosis present

## 2020-02-26 DIAGNOSIS — Z6841 Body Mass Index (BMI) 40.0 and over, adult: Secondary | ICD-10-CM

## 2020-02-26 DIAGNOSIS — E119 Type 2 diabetes mellitus without complications: Secondary | ICD-10-CM

## 2020-02-26 DIAGNOSIS — Z7984 Long term (current) use of oral hypoglycemic drugs: Secondary | ICD-10-CM

## 2020-02-26 DIAGNOSIS — R4701 Aphasia: Secondary | ICD-10-CM | POA: Diagnosis present

## 2020-02-26 DIAGNOSIS — R29701 NIHSS score 1: Secondary | ICD-10-CM | POA: Diagnosis present

## 2020-02-26 DIAGNOSIS — G4730 Sleep apnea, unspecified: Secondary | ICD-10-CM | POA: Diagnosis present

## 2020-02-26 DIAGNOSIS — R410 Disorientation, unspecified: Secondary | ICD-10-CM | POA: Diagnosis not present

## 2020-02-26 DIAGNOSIS — Z96651 Presence of right artificial knee joint: Secondary | ICD-10-CM | POA: Diagnosis present

## 2020-02-26 DIAGNOSIS — Z87891 Personal history of nicotine dependence: Secondary | ICD-10-CM

## 2020-02-26 DIAGNOSIS — Z20822 Contact with and (suspected) exposure to covid-19: Secondary | ICD-10-CM | POA: Diagnosis present

## 2020-02-26 DIAGNOSIS — I6523 Occlusion and stenosis of bilateral carotid arteries: Secondary | ICD-10-CM | POA: Diagnosis not present

## 2020-02-26 DIAGNOSIS — I10 Essential (primary) hypertension: Secondary | ICD-10-CM | POA: Diagnosis present

## 2020-02-26 DIAGNOSIS — E039 Hypothyroidism, unspecified: Secondary | ICD-10-CM | POA: Diagnosis not present

## 2020-02-26 HISTORY — DX: Cerebral infarction, unspecified: I63.9

## 2020-02-26 LAB — CBC
HCT: 42.6 % (ref 39.0–52.0)
Hemoglobin: 14.3 g/dL (ref 13.0–17.0)
MCH: 30.4 pg (ref 26.0–34.0)
MCHC: 33.6 g/dL (ref 30.0–36.0)
MCV: 90.4 fL (ref 80.0–100.0)
Platelets: 197 10*3/uL (ref 150–400)
RBC: 4.71 MIL/uL (ref 4.22–5.81)
RDW: 13.6 % (ref 11.5–15.5)
WBC: 7.8 10*3/uL (ref 4.0–10.5)
nRBC: 0 % (ref 0.0–0.2)

## 2020-02-26 LAB — COMPREHENSIVE METABOLIC PANEL
ALT: 17 U/L (ref 0–44)
AST: 20 U/L (ref 15–41)
Albumin: 4.2 g/dL (ref 3.5–5.0)
Alkaline Phosphatase: 51 U/L (ref 38–126)
Anion gap: 11 (ref 5–15)
BUN: 28 mg/dL — ABNORMAL HIGH (ref 8–23)
CO2: 27 mmol/L (ref 22–32)
Calcium: 9.6 mg/dL (ref 8.9–10.3)
Chloride: 101 mmol/L (ref 98–111)
Creatinine, Ser: 1.11 mg/dL (ref 0.61–1.24)
GFR calc Af Amer: >60 mL/min (ref >60)
GFR calc non Af Amer: >60 mL/min (ref >60)
Glucose, Bld: 171 mg/dL — ABNORMAL HIGH (ref 70–99)
Potassium: 3.9 mmol/L (ref 3.5–5.1)
Sodium: 139 mmol/L (ref 135–145)
Total Bilirubin: 1.1 mg/dL (ref 0.3–1.2)
Total Protein: 7.7 g/dL (ref 6.5–8.1)

## 2020-02-26 LAB — DIFFERENTIAL
Abs Immature Granulocytes: 0.06 10*3/uL (ref 0.00–0.07)
Basophils Absolute: 0.1 10*3/uL (ref 0.0–0.1)
Basophils Relative: 1 %
Eosinophils Absolute: 0.2 10*3/uL (ref 0.0–0.5)
Eosinophils Relative: 3 %
Immature Granulocytes: 1 %
Lymphocytes Relative: 18 %
Lymphs Abs: 1.4 10*3/uL (ref 0.7–4.0)
Monocytes Absolute: 0.6 10*3/uL (ref 0.1–1.0)
Monocytes Relative: 8 %
Neutro Abs: 5.5 10*3/uL (ref 1.7–7.7)
Neutrophils Relative %: 69 %

## 2020-02-26 LAB — PROTIME-INR
INR: 1 (ref 0.8–1.2)
Prothrombin Time: 13 seconds (ref 11.4–15.2)

## 2020-02-26 LAB — APTT: aPTT: 29 seconds (ref 24–36)

## 2020-02-26 LAB — HEMOGLOBIN A1C
Hgb A1c MFr Bld: 6.3 % — ABNORMAL HIGH (ref 4.8–5.6)
Mean Plasma Glucose: 134.11 mg/dL

## 2020-02-26 LAB — GLUCOSE, CAPILLARY: Glucose-Capillary: 117 mg/dL — ABNORMAL HIGH (ref 70–99)

## 2020-02-26 MED ORDER — LORAZEPAM 1 MG PO TABS
1.0000 mg | ORAL_TABLET | Freq: Once | ORAL | Status: AC
  Administered 2020-02-26: 1 mg via ORAL
  Filled 2020-02-26: qty 1

## 2020-02-26 MED ORDER — INSULIN ASPART 100 UNIT/ML ~~LOC~~ SOLN
6.0000 [IU] | Freq: Three times a day (TID) | SUBCUTANEOUS | Status: DC
  Administered 2020-02-27 – 2020-02-28 (×5): 6 [IU] via SUBCUTANEOUS
  Filled 2020-02-26 (×6): qty 1

## 2020-02-26 MED ORDER — SODIUM CHLORIDE 0.9% FLUSH: 3.0000 mL | Freq: Once | INTRAVENOUS | Status: DC

## 2020-02-26 MED ORDER — INSULIN ASPART 100 UNIT/ML ~~LOC~~ SOLN
0.0000 [IU] | Freq: Three times a day (TID) | SUBCUTANEOUS | Status: DC
  Administered 2020-02-27 (×3): 3 [IU] via SUBCUTANEOUS
  Administered 2020-02-28: 12:00:00 4 [IU] via SUBCUTANEOUS
  Administered 2020-02-28: 09:00:00 3 [IU] via SUBCUTANEOUS
  Filled 2020-02-26 (×5): qty 1

## 2020-02-26 MED ORDER — ASPIRIN EC 325 MG PO TBEC
325.0000 mg | DELAYED_RELEASE_TABLET | Freq: Every day | ORAL | Status: DC
  Administered 2020-02-26: 325 mg via ORAL
  Filled 2020-02-26: qty 1

## 2020-02-26 MED ORDER — LORAZEPAM 2 MG/ML IJ SOLN: 1.0000 mg | Freq: Once | INTRAMUSCULAR | Status: AC

## 2020-02-26 MED ORDER — ACETAMINOPHEN 325 MG PO TABS: 650.0000 mg | ORAL_TABLET | ORAL | Status: DC | PRN

## 2020-02-26 MED ORDER — SODIUM CHLORIDE 0.9 % IV SOLN: INTRAVENOUS | Status: DC

## 2020-02-26 MED ORDER — ADULT MULTIVITAMIN W/MINERALS CH
1.0000 | ORAL_TABLET | Freq: Every day | ORAL | Status: DC
  Administered 2020-02-26 – 2020-02-28 (×3): 1 via ORAL
  Filled 2020-02-26 (×3): qty 1

## 2020-02-26 MED ORDER — ATORVASTATIN CALCIUM 20 MG PO TABS
80.0000 mg | ORAL_TABLET | Freq: Every day | ORAL | Status: DC
  Administered 2020-02-27: 18:00:00 80 mg via ORAL
  Filled 2020-02-26 (×2): qty 4

## 2020-02-26 MED ORDER — LEVOTHYROXINE SODIUM 88 MCG PO TABS
88.0000 ug | ORAL_TABLET | Freq: Every day | ORAL | Status: DC
  Administered 2020-02-27 – 2020-02-28 (×2): 88 ug via ORAL
  Filled 2020-02-26 (×2): qty 1

## 2020-02-26 MED ORDER — ACETAMINOPHEN 650 MG RE SUPP: 650.0000 mg | RECTAL | Status: DC | PRN

## 2020-02-26 MED ORDER — ACETAMINOPHEN 160 MG/5ML PO SOLN
650.0000 mg | ORAL | Status: DC | PRN
  Filled 2020-02-26: qty 20.3

## 2020-02-26 MED ORDER — IOHEXOL 350 MG/ML SOLN
75.0000 mL | Freq: Once | INTRAVENOUS | Status: AC | PRN
  Administered 2020-02-26: 75 mL via INTRAVENOUS

## 2020-02-26 MED ORDER — SENNOSIDES-DOCUSATE SODIUM 8.6-50 MG PO TABS: 1.0000 | ORAL_TABLET | Freq: Every evening | ORAL | Status: DC | PRN

## 2020-02-26 MED ORDER — STROKE: EARLY STAGES OF RECOVERY BOOK: Freq: Once | Status: AC

## 2020-02-26 NOTE — ED Notes (Signed)
Gave patient a phone to speak with wife, informed her of bed assignment

## 2020-02-26 NOTE — ED Provider Notes (Signed)
[facility] Emergency Department Provider Note  Time seen: 10:43 AM  I have reviewed the triage vital signs and the nursing notes.   HISTORY  Chief Complaint Altered Mental Status   HPI Derrick Mckenzie is a 69 y.o. male with a past medical history of arthritis, gastric reflux, hypertension, obesity, presents to the emergency department for word finding difficulty confusion headache and visual disturbance.  According to the patient around 8 PM last night he was having some trouble seeing out of his right eye, wife noted some word finding difficulty where the patient appeared quite confused and was saying inappropriate words at times.  This was followed shortly afterwards by a headache which the patient states was pretty severe.  Took Tylenol and the headache improved.   States right eye visual deficit improved but he was continuing to have some word finding difficulty.  They decided to come to the emergency department today after symptoms fail to completely resolve overnight.  Currently the patient appears well, denies any symptoms at this time.  However twice during the exam he did mix up his words for instance I asked about any right arm weakness and the patient states that his right arm "vision" is fine.  Patient states a similar event occurred last summer with word finding difficulty and right arm weakness but resolved shortly afterwards was diagnosed with a possible TIA.  Past Medical History:  Diagnosis Date  . Arthritis    knees  . Complication of anesthesia    aggitation after knee surgery  . Family history of adverse reaction to anesthesia    brother - hallucinations  . GERD (gastroesophageal reflux disease)   . Hx of smoking 06/26/2015  . Hyperlipidemia   . Hypertension   . Hypothyroidism   . Kidney stone   . Morbid obesity ([place])   . Murmur   . Prediabetes 10/14/2016  . Sleep apnea    score of 5 on apnea screen    Patient Active Problem List    Diagnosis Date Noted  . Iliac artery aneurysm ([place]) 05/07/2019  . Prostate cancer screening 03/16/2017  . Full code status 03/16/2017  . Preventative health care 03/16/2017  . Need for hepatitis C screening test 10/14/2016  . Prediabetes 10/14/2016  . Peptic ulcer of stomach   . Calculus of gallbladder without cholecystitis without obstruction 09/27/2016  . Right nephrolithiasis 09/27/2016  . Status post right knee replacement 09/08/2016  . Medication monitoring encounter 10/30/2015  . Sleep pattern disturbance 06/26/2015  . Hx of smoking 06/26/2015  . Need for prophylactic vaccination against Streptococcus pneumoniae (pneumococcus) 06/26/2015  . Hypertension   . Hyperlipidemia   . Morbid obesity ([place])   . Hypothyroidism   . Degenerative arthritis of knee, bilateral 06/12/2015  . Chest pain, atypical 02/04/2015    Past Surgical History:  Procedure Laterality Date  . ESOPHAGOGASTRODUODENOSCOPY (EGD) WITH PROPOFOL N/A 10/05/2016   Procedure: ESOPHAGOGASTRODUODENOSCOPY (EGD) WITH PROPOFOL;  Surgeon: Jonathon Bellows, MD;  Location: [place];  Service: Endoscopy;  Laterality: N/A;  sleep apnea  . HERNIA REPAIR    . REPLACEMENT TOTAL KNEE Right 2017  . TOTAL KNEE ARTHROPLASTY  1973    Prior to Admission medications   Medication Sig Start Date End Date Taking? Authorizing Provider  albuterol (PROVENTIL HFA;VENTOLIN HFA) 108 (90 Base) MCG/ACT inhaler Inhale 2 puffs into the lungs every 6 (six) hours as needed for wheezing or shortness of breath. 01/21/19   Lannie Fields, PA-C  amLODipine (NORVASC) 10  MG tablet TAKE 1 TABLET (10 MG TOTAL) BY MOUTH DAILY. 11/08/16   Arnetha Courser, MD  atorvastatin (LIPITOR) 20 MG tablet Take 20 mg by mouth daily.    [provider]  cyclobenzaprine (FLEXERIL) 10 MG tablet Take 1 tablet (10 mg total) by mouth 3 (three) times daily as needed for muscle spasms. Patient not taking: Reported on 02/26/2020 06/08/18   Darel Hong, MD   furosemide (LASIX) 40 MG tablet  02/14/20   [provider]  hydrochlorothiazide (HYDRODIURIL) 12.5 MG tablet Take 1 tablet (12.5 mg total) by mouth daily. Patient not taking: Reported on 02/26/2020 04/12/17   Arnetha Courser, MD  Krill Oil 1000 MG CAPS Take by mouth.    [provider]  levothyroxine (SYNTHROID, LEVOTHROID) 88 MCG tablet Take 1 tablet (88 mcg total) by mouth daily. 03/16/17   Arnetha Courser, MD  metFORMIN (GLUCOPHAGE) 500 MG tablet Take by mouth 2 (two) times daily with a meal.    [provider]  Multiple Vitamin (MULTIVITAMIN WITH MINERALS) TABS tablet Take 1 tablet by mouth daily.    [provider]  Phenylephrine-Acetaminophen 5-325 MG TABS  03/14/17   [provider]  predniSONE (DELTASONE) 50 MG tablet Take one 50 mg tablet once daily for the next five days. Patient not taking: Reported on 05/07/2019 01/21/19   Lannie Fields, PA-C    No Known Allergies  Family History  Problem Relation Age of Onset  . Cancer Mother        leukemia  . Diabetes Brother   . Blindness Brother   . Heart attack Maternal Grandfather   . Prostate cancer Neg Hx   . Bladder Cancer Neg Hx   . Kidney cancer Neg Hx     Social History Social History   Tobacco Use  . Smoking status: Former Smoker    Packs/day: 0.00    Years: 10.00    Pack years: 0.00    Types: Cigarettes    Quit date: 02/03/2016    Years since quitting: 4.0  . Smokeless tobacco: Never Used  Substance Use Topics  . Alcohol use: Yes    Alcohol/week: 0.0 standard drinks    Comment: socially - Holidays  . Drug use: No    Review of Systems Constitutional: Negative for fever. Eyes: Negative for visual complaints ENT: Negative for recent illness/congestion Cardiovascular: Negative for chest pain. Respiratory: Negative for shortness of breath. Gastrointestinal: Negative for abdominal pain, vomiting and diarrhea. Genitourinary: Negative for urinary compaints Musculoskeletal:  Negative for musculoskeletal complaints Skin: Negative for skin complaints  Neurological: Negative for headache All other ROS negative  ____________________________________________   PHYSICAL EXAM:  VITAL SIGNS: ED Triage Vitals [02/26/20 1024]  Enc Vitals Group     BP 130/90     Pulse Rate 69     Resp 18     Temp 98.5 F (36.9 C)     Temp Source Oral     SpO2 95 %     Weight 300 lb (136.1 kg)     Height 5\' 8"  (1.727 m)     Head Circumference      Peak Flow      Pain Score 0     Pain Loc      Pain Edu?      Excl. in [place]?     Constitutional: Alert and oriented. Well appearing and in no distress. Eyes: Normal exam ENT      Head: Normocephalic and atraumatic.  Mouth/Throat: Mucous membranes are moist. Cardiovascular: Normal rate, regular rhythm.  2/6 systolic murmur. Respiratory: Normal respiratory effort without tachypnea nor retractions. Breath sounds are clear Gastrointestinal: Soft and nontender. No distention.  Musculoskeletal: Nontender with normal range of motion in all extremities Neurologic:  Normal speech and language. No gross focal neurologic deficits.  Equal grip strength bilaterally.  No pronator drift.  5/5 motor in all extremities.  No sensory deficit.  Cranial nerves intact. Skin:  Skin is warm, dry and intact.  Psychiatric: Mood and affect are normal.   ____________________________________________    EKG  EKG viewed and interpreted by myself shows a normal sinus rhythm at 73 bpm with a narrow QRS, normal axis, largely normal intervals.  Nonspecific ST changes without ST elevation.  Does have lateral T wave inversions.  No old EKG for comparison.  ____________________________________________    RADIOLOGY  CT scan appears to be consistent with acute infarct, no bleed.  ____________________________________________   INITIAL IMPRESSION / ASSESSMENT AND PLAN / ED COURSE  Pertinent labs & imaging results that were available during my care of  the patient were reviewed by me and considered in my medical decision making (see chart for details).   Patient presents to the emergency department for word finding difficulty headache and some right eye deficits starting 8 PM last night.  States improvement overnight but has not completely resolved.  Here the patient continues to have occasional word finding difficulty.  Patient CT scan appears to be consistent with acute infarct without bleed.  I spoke to Dr. Doy Mince will obtain CTA imaging of the head and neck.  Patient will require admission to the hospitalist for further work-up.  Ahmod Anelli Witman was evaluated in Emergency Department on 02/26/2020 for the symptoms described in the history of present illness. He was evaluated in the context of the global COVID-19 pandemic, which necessitated consideration that the patient might be at risk for infection with the SARS-CoV-2 virus that causes COVID-19. Institutional protocols and algorithms that pertain to the evaluation of patients at risk for COVID-19 are in a state of rapid change based on information released by regulatory bodies including the CDC and federal and state organizations. These policies and algorithms were followed during the patient's care in the ED.  NIH Stroke Scale   Interval: Baseline Time: 10:50 AM Person Administering Scale: Harvest Dark  Administer stroke scale items in the order listed. Record performance in each category after each subscale exam. Do not go back and change scores. Follow directions provided for each exam technique. Scores should reflect what the patient does, not what the clinician thinks the patient can do. The clinician should record answers while administering the exam and work quickly. Except where indicated, the patient should not be coached (i.e., repeated requests to patient to make a special effort).   1a  Level of consciousness: 0=alert; keenly responsive  1b. LOC questions:  0=Performs  both tasks correctly  1c. LOC commands: 0=Performs both tasks correctly  2.  Best Gaze: 0=normal  3.  Visual: 0=No visual loss  4. Facial Palsy: 0=Normal symmetric movement  5a.  Motor left arm: 0=No drift, limb holds 90 (or 45) degrees for full 10 seconds  5b.  Motor right arm: 0=No drift, limb holds 90 (or 45) degrees for full 10 seconds  6a. motor left leg: 0=No drift, limb holds 90 (or 45) degrees for full 10 seconds  6b  Motor right leg:  0=No drift, limb holds 90 (or 45) degrees for  full 10 seconds  7. Limb Ataxia: 0=Absent  8.  Sensory: 0=Normal; no sensory loss  9. Best Language:  1  10. Dysarthria: 0=Normal  11. Extinction and Inattention: 0=No abnormality  12. Distal motor function: 0=Normal   Total:   1   ____________________________________________   FINAL CLINICAL IMPRESSION(S) / ED DIAGNOSES  CVA   Harvest Dark, MD 02/26/20 1404

## 2020-02-26 NOTE — ED Triage Notes (Signed)
C/O 'confusion and unable to read words"  Symptoms initially started last night at 2000.  States symptoms started to see 'a little better' but did not resolve, and woke up this morning.  Patient is AAOx3.  Skin warm and dry.  AAOx3.  Skin warm and dry.  Speech clear.  MAE equally and strong.  Equal sensation .  NAD

## 2020-02-26 NOTE — ED Notes (Signed)
Attempted to call report to 1C, secretary asked that I call back in 10 mins

## 2020-02-26 NOTE — ED Triage Notes (Signed)
Pt c/o confusion and not able to see all the words with his right eye since yesterday around 8pm, states he was fine before that. Pt has a hx of stroke and is getting ready to have heart surgery on 4/15 at [facility]

## 2020-02-26 NOTE — H&P (Signed)
History and Physical    Derrick Mckenzie [id number] DOB: 22-Sep-1951 DOA: 02/26/2020  PCP: Sofie Hartigan, MD   Patient coming from: Home  I have personally briefly reviewed patient's old medical records in [place]  Chief Complaint: Change in mental status  HPI: Derrick Mckenzie is a 69 y.o. male with medical history significant for arthritis, gastric reflux, hypertension, obesity *(BMI 45), who presents to the emergency department for word finding difficulty, confusion, headache and visual disturbance.  According to the patient his symptoms started around 8 PM last night when he started having some trouble seeing out of his right eye, wife noted some word finding difficulty where the patient appeared quite confused and was saying inappropriate words at times.  This was followed shortly afterwards by a headache which the patient states was pretty severe.  Took Tylenol and the headache improved. States right eye visual deficit improved but he continued to have some word finding difficulty.  They decided to come to the emergency department today after symptoms failed to completely resolve overnight.  Currently the patient appears well, denies any symptoms at this time.  However twice during his exam in the ER he did mix up his words for instance he was asked about any right arm weakness and the patient stated that his right arm "vision" is fine.  Patient stated a similar event occurred last summer with word finding difficulty and right arm weakness but resolved shortly afterwards and was diagnosed with a possible TIA. Denies having any chest pain, shortness of breath, nausea, vomiting, abdominal pain, urinary symptoms, tinnitus or lightheadedness. Patient had a CT scan of the head without contrast which showed loss of gray-white differentiation in the left parieto-occipital region suspicious for acute to subacute infarct. No hemorrhagic component identified.  He had a CT angiogram  of the head and neck which did not show any evidence of a large vessel occlusion  ED Course: Patient presents to the emergency department for word finding difficulty, headache and some right eye deficits starting 8 PM last night.  Stated improvement overnight but has not completely resolved.  Here the patient continues to have occasional word finding difficulty and blurred vision in his right eye.  Patient CT scan appears to be consistent with acute infarct without bleed.  I spoke to Dr. Doy Mince will obtain CTA imaging of the head and neck.  Patient will require admission to the hospitalist for further work-up.  Review of Systems: As per HPI otherwise 10 point review of systems negative.    Past Medical History:  Diagnosis Date  . Arthritis    knees  . Complication of anesthesia    aggitation after knee surgery  . Family history of adverse reaction to anesthesia    brother - hallucinations  . GERD (gastroesophageal reflux disease)   . Hx of smoking 06/26/2015  . Hyperlipidemia   . Hypertension   . Hypothyroidism   . Kidney stone   . Morbid obesity ([place])   . Murmur   . Prediabetes 10/14/2016  . Sleep apnea    score of 5 on apnea screen    Past Surgical History:  Procedure Laterality Date  . ESOPHAGOGASTRODUODENOSCOPY (EGD) WITH PROPOFOL N/A 10/05/2016   Procedure: ESOPHAGOGASTRODUODENOSCOPY (EGD) WITH PROPOFOL;  Surgeon: Jonathon Bellows, MD;  Location: [facility];  Service: Endoscopy;  Laterality: N/A;  sleep apnea  . HERNIA REPAIR    . REPLACEMENT TOTAL KNEE Right 2017  . TOTAL KNEE ARTHROPLASTY  1973  reports that he quit smoking about 4 years ago. His smoking use included cigarettes. He smoked 0.00 packs per day for 10.00 years. He has never used smokeless tobacco. He reports current alcohol use. He reports that he does not use drugs.  No Known Allergies  Family History  Problem Relation Age of Onset  . Cancer Mother        leukemia  . Diabetes Brother   .  Blindness Brother   . Heart attack Maternal Grandfather   . Prostate cancer Neg Hx   . Bladder Cancer Neg Hx   . Kidney cancer Neg Hx      Prior to Admission medications   Medication Sig Start Date End Date Taking? Authorizing Provider  albuterol (PROVENTIL HFA;VENTOLIN HFA) 108 (90 Base) MCG/ACT inhaler Inhale 2 puffs into the lungs every 6 (six) hours as needed for wheezing or shortness of breath. 01/21/19   Lannie Fields, PA-C  amLODipine (NORVASC) 10 MG tablet TAKE 1 TABLET (10 MG TOTAL) BY MOUTH DAILY. 11/08/16   Arnetha Courser, MD  atorvastatin (LIPITOR) 20 MG tablet Take 20 mg by mouth daily.    [provider]  cyclobenzaprine (FLEXERIL) 10 MG tablet Take 1 tablet (10 mg total) by mouth 3 (three) times daily as needed for muscle spasms. Patient not taking: Reported on 02/26/2020 06/08/18   Darel Hong, MD  furosemide (LASIX) 40 MG tablet  02/14/20   [provider]  hydrochlorothiazide (HYDRODIURIL) 12.5 MG tablet Take 1 tablet (12.5 mg total) by mouth daily. Patient not taking: Reported on 02/26/2020 04/12/17   Arnetha Courser, MD  Krill Oil 1000 MG CAPS Take by mouth.    [provider]  levothyroxine (SYNTHROID, LEVOTHROID) 88 MCG tablet Take 1 tablet (88 mcg total) by mouth daily. 03/16/17   Arnetha Courser, MD  metFORMIN (GLUCOPHAGE) 500 MG tablet Take by mouth 2 (two) times daily with a meal.    [provider]  Multiple Vitamin (MULTIVITAMIN WITH MINERALS) TABS tablet Take 1 tablet by mouth daily.    [provider]  Phenylephrine-Acetaminophen 5-325 MG TABS  03/14/17   [provider]  predniSONE (DELTASONE) 50 MG tablet Take one 50 mg tablet once daily for the next five days. Patient not taking: Reported on 05/07/2019 01/21/19   Lannie Fields, [place]    Physical Exam: Vitals:   02/26/20 1145 02/26/20 1300 02/26/20 1322 02/26/20 1323  BP:   112/75   Pulse: 64 63 64 64  Resp: 11 11 14 15   Temp:      TempSrc:        SpO2: 96% 97% 97% 96%  Weight:      Height:         Vitals:   02/26/20 1145 02/26/20 1300 02/26/20 1322 02/26/20 1323  BP:   112/75   Pulse: 64 63 64 64  Resp: 11 11 14 15   Temp:      TempSrc:      SpO2: 96% 97% 97% 96%  Weight:      Height:        Constitutional: NAD, alert and oriented x 3. Morbidly obese Eyes: PERRL, lids and conjunctivae normal ENMT: Mucous membranes are moist.  Neck: normal, supple, no masses, no thyromegaly Respiratory: clear to auscultation bilaterally, no wheezing, no crackles. Normal respiratory effort. No accessory muscle use.  Cardiovascular: Regular rate and rhythm, no murmurs / rubs / gallops. No extremity edema. 2+ pedal pulses. No carotid bruits.  Abdomen: no tenderness,  no masses palpated. No hepatosplenomegaly. Bowel sounds positive. Central adiposity Musculoskeletal: no clubbing / cyanosis. No joint deformity upper and lower extremities.  Skin: no rashes, lesions, ulcers.  Neurologic: Able to move all extremities, no facial droop, expressive aphasia Psychiatric: Normal mood and affect.   Labs on Admission: I have personally reviewed following labs and imaging studies  CBC: Recent Labs  Lab 02/26/20 1053  WBC 7.8  NEUTROABS 5.5  HGB 14.3  HCT 42.6  MCV 90.4  PLT [id number]   Basic Metabolic Panel: Recent Labs  Lab 02/26/20 1053  NA 139  K 3.9  CL 101  CO2 27  GLUCOSE 171*  BUN 28*  CREATININE 1.11  CALCIUM 9.6   GFR: Estimated Creatinine Clearance: 83.6 mL/min (by C-G formula based on SCr of 1.11 mg/dL). Liver Function Tests: Recent Labs  Lab 02/26/20 1053  AST 20  ALT 17  ALKPHOS 51  BILITOT 1.1  PROT 7.7  ALBUMIN 4.2   No results for input(s): LIPASE, AMYLASE in the last 168 hours. No results for input(s): AMMONIA in the last 168 hours. Coagulation Profile: Recent Labs  Lab 02/26/20 1053  INR 1.0   Cardiac Enzymes: No results for input(s): CKTOTAL, CKMB, CKMBINDEX, TROPONINI in the last 168 hours. BNP (last  3 results) No results for input(s): PROBNP in the last 8760 hours. HbA1C: No results for input(s): HGBA1C in the last 72 hours. CBG: No results for input(s): GLUCAP in the last 168 hours. Lipid Profile: No results for input(s): CHOL, HDL, LDLCALC, TRIG, CHOLHDL, LDLDIRECT in the last 72 hours. Thyroid Function Tests: No results for input(s): TSH, T4TOTAL, FREET4, T3FREE, THYROIDAB in the last 72 hours. Anemia Panel: No results for input(s): VITAMINB12, FOLATE, FERRITIN, TIBC, IRON, RETICCTPCT in the last 72 hours. Urine analysis:    Component Value Date/Time   COLORURINE YELLOW (A) 06/08/2018 1949   APPEARANCEUR CLEAR (A) 06/08/2018 1949   LABSPEC 1.027 06/08/2018 1949   PHURINE 5.0 06/08/2018 1949   GLUCOSEU NEGATIVE 06/08/2018 1949   HGBUR NEGATIVE 06/08/2018 1949   BILIRUBINUR NEGATIVE 06/08/2018 1949   KETONESUR NEGATIVE 06/08/2018 1949   PROTEINUR NEGATIVE 06/08/2018 1949   NITRITE NEGATIVE 06/08/2018 1949   LEUKOCYTESUR NEGATIVE 06/08/2018 1949    Radiological Exams on Admission: CT Angio Head W or Wo Contrast  Result Date: 02/26/2020 CLINICAL DATA:  Stroke, follow-up EXAM: CT ANGIOGRAPHY HEAD AND NECK TECHNIQUE: Multidetector CT imaging of the head and neck was performed using the standard protocol during bolus administration of intravenous contrast. Multiplanar CT image reconstructions and MIPs were obtained to evaluate the vascular anatomy. Carotid stenosis measurements (when applicable) are obtained utilizing NASCET criteria, using the distal internal carotid diameter as the denominator. CONTRAST:  42mL OMNIPAQUE IOHEXOL 350 MG/ML SOLN COMPARISON:  CT head earlier same day FINDINGS: CT HEAD FINDINGS Brain: Unchanged hypoattenuation and loss of gray differentiation in the left occipitotemporal region. There is no acute intracranial hemorrhage or new loss of gray-white differentiation. Small chronic infarct of the left precentral gyrus. Additional patchy hypoattenuation in the  supratentorial white matter is nonspecific but likely reflects mild chronic microvascular ischemic changes. Ventricles are stable in size. Vascular: No new findings. Skull: No new findings. Sinuses: No new findings. Orbits: No new findings. Review of the MIP images confirms the above findings CTA NECK FINDINGS Aortic arch: Mild calcified plaque along the aortic arch. Patent great vessel origins. Right carotid system: Patent. Mild calcified and noncalcified plaque at the ICA origin without measurable stenosis. Left carotid system: Patent.  No  measurable stenosis. Vertebral arteries: Patent. Right vertebral artery is dominant. There is a diminutive left vertebral artery. Skeleton: Degenerative changes of the cervical spine. Other neck: No mass or adenopathy. Upper chest: No apical lung mass. Review of the MIP images confirms the above findings CTA HEAD FINDINGS Anterior circulation: Intracranial internal carotid arteries are patent. Anterior and middle cerebral arteries are patent. Anterior communicating artery is present. Posterior circulation: Intracranial vertebral arteries are patent. Basilar artery is patent. Posterior cerebral arteries are patent. There are bilateral posterior communicating arteries. Venous sinuses: As permitted by contrast timing, patent. Review of the MIP images confirms the above findings IMPRESSION: Evolving left occipitotemporal infarction is unchanged in appearance. No acute intracranial hemorrhage. No large vessel occlusion or hemodynamically significant stenosis. Electronically Signed   By: Macy Mis M.D.   On: 02/26/2020 12:40   CT HEAD WO CONTRAST  Result Date: 02/26/2020 CLINICAL DATA:  Confusion EXAM: CT HEAD WITHOUT CONTRAST TECHNIQUE: Contiguous axial images were obtained from the base of the skull through the vertex without intravenous contrast. COMPARISON:  01/10/2020 FINDINGS: Brain: Loss of gray-white differentiation in the left parieto-occipital region (series 3,  images 16-17). No intracranial hemorrhage or extra-axial collection. Normal ventricular size and configuration without hydrocephalus. Vascular: No hyperdense vessel or unexpected calcification. Skull: Normal. Negative for fracture or focal lesion. Sinuses/Orbits: No acute finding. Other: None. IMPRESSION: Loss of gray-white differentiation in the left parieto-occipital region suspicious for acute to subacute infarct. No hemorrhagic component identified. Further evaluation with MRI is recommended. These results were called by telephone at the time of interpretation on 02/26/2020 at 10:46 am to provider Kerri Perches, RN, who verbally acknowledged these results. Electronically Signed   By: Davina Poke D.O.   On: 02/26/2020 10:47   CT Angio Neck W and/or Wo Contrast  Result Date: 02/26/2020 CLINICAL DATA:  Stroke, follow-up EXAM: CT ANGIOGRAPHY HEAD AND NECK TECHNIQUE: Multidetector CT imaging of the head and neck was performed using the standard protocol during bolus administration of intravenous contrast. Multiplanar CT image reconstructions and MIPs were obtained to evaluate the vascular anatomy. Carotid stenosis measurements (when applicable) are obtained utilizing NASCET criteria, using the distal internal carotid diameter as the denominator. CONTRAST:  42mL OMNIPAQUE IOHEXOL 350 MG/ML SOLN COMPARISON:  CT head earlier same day FINDINGS: CT HEAD FINDINGS Brain: Unchanged hypoattenuation and loss of gray differentiation in the left occipitotemporal region. There is no acute intracranial hemorrhage or new loss of gray-white differentiation. Small chronic infarct of the left precentral gyrus. Additional patchy hypoattenuation in the supratentorial white matter is nonspecific but likely reflects mild chronic microvascular ischemic changes. Ventricles are stable in size. Vascular: No new findings. Skull: No new findings. Sinuses: No new findings. Orbits: No new findings. Review of the MIP images confirms the above  findings CTA NECK FINDINGS Aortic arch: Mild calcified plaque along the aortic arch. Patent great vessel origins. Right carotid system: Patent. Mild calcified and noncalcified plaque at the ICA origin without measurable stenosis. Left carotid system: Patent.  No measurable stenosis. Vertebral arteries: Patent. Right vertebral artery is dominant. There is a diminutive left vertebral artery. Skeleton: Degenerative changes of the cervical spine. Other neck: No mass or adenopathy. Upper chest: No apical lung mass. Review of the MIP images confirms the above findings CTA HEAD FINDINGS Anterior circulation: Intracranial internal carotid arteries are patent. Anterior and middle cerebral arteries are patent. Anterior communicating artery is present. Posterior circulation: Intracranial vertebral arteries are patent. Basilar artery is patent. Posterior cerebral arteries are patent. There are bilateral  posterior communicating arteries. Venous sinuses: As permitted by contrast timing, patent. Review of the MIP images confirms the above findings IMPRESSION: Evolving left occipitotemporal infarction is unchanged in appearance. No acute intracranial hemorrhage. No large vessel occlusion or hemodynamically significant stenosis. Electronically Signed   By: Macy Mis M.D.   On: 02/26/2020 12:40    EKG: Independently reviewed.  Sinus rhythm T wave changes in the lateral leads  Assessment/Plan Principal Problem:   Acute CVA (cerebrovascular accident) [facility]) Active Problems:   Hypertension   Morbid obesity ([place])   Hypothyroidism   Type 2 diabetes mellitus without complication ([place])     Acute CVA Patient presented for evaluation of expressive aphasia and blurred vision involving his right eye. He was past the window for TPA administration by the time he arrived in the emergency room He had a CT scan of the brain which was negative for acute hemorrhage but showed loss of gray-white differentiation in the left  parieto-occipital region suspicious for acute to subacute infarct. No hemorrhagic component identified. Further evaluation with MRI is recommended. CTA head and neck is negative for any large vessel occlusion Place patient on aspirin 325 mg daily and atorvastatin 80 mg daily Allow for permissive hypertension Obtain 2D echocardiogram to assess LVEF and rule out cardiac thrombus Consult neurology Obtain PT, speech and occupational therapy consult   Hypothyroidism Continue Synthroid   Morbid obesity (BMI 45) Complicates overall prognosis and care    Diabetes Mellitus Non insulin dependent Maintain consistent carbohydrate diet Place patient on sliding scale coverage Resume Metformin on discharge   Hypertension Hold Amlodipine and HCTZ for now Allow for permissive hypertension    DVT prophylaxis: SCD Code Status: Full Family Communication: Plan of care was discussed with patient and his wife at the bedside. They verbalize understanding and agreed with the plan Disposition Plan: Back to previous home environment Consults called: Neurology    Collier Bullock MD [facility]     02/26/2020, 2:40 PM

## 2020-02-26 NOTE — Consult Note (Signed)
Requesting Physician: Agbata    Chief Complaint: Difficulty with speech  I have been asked by Dr. Francine Graven to see this patient in consultation for stroke.  HPI: Derrick Mckenzie is an 69 y.o. male with medical history significant for arthritis, gastric reflux, hypertension, obesity *(BMI 45), who presents to the emergency department for word finding difficulty, confusion, headache and visual disturbance. According to the patient his symptoms started around 8 PM last night when he started having some trouble seeing out of his right eye and his wife noted some word finding difficulty. This was followed shortly afterwards by a headache.States right eye visual deficit and headache improved but he continued to have some word finding difficulty. They decided to come to the emergency department today after symptoms failed to completely resolve overnight.  Initial NIHSS of 1.  Date last known well: 02/25/2020 Time last known well: Time: 20:00 tPA Given: No: Outside time window  Past Medical History:  Diagnosis Date  . Arthritis    knees  . Complication of anesthesia    aggitation after knee surgery  . Family history of adverse reaction to anesthesia    brother - hallucinations  . GERD (gastroesophageal reflux disease)   . Hx of smoking 06/26/2015  . Hyperlipidemia   . Hypertension   . Hypothyroidism   . Kidney stone   . Morbid obesity ([place])   . Murmur   . Prediabetes 10/14/2016  . Sleep apnea    score of 5 on apnea screen    Past Surgical History:  Procedure Laterality Date  . ESOPHAGOGASTRODUODENOSCOPY (EGD) WITH PROPOFOL N/A 10/05/2016   Procedure: ESOPHAGOGASTRODUODENOSCOPY (EGD) WITH PROPOFOL;  Surgeon: Jonathon Bellows, MD;  Location: [facility];  Service: Endoscopy;  Laterality: N/A;  sleep apnea  . HERNIA REPAIR    . REPLACEMENT TOTAL KNEE Right 2017  . TOTAL KNEE ARTHROPLASTY  1973    Family History  Problem Relation Age of Onset  . Cancer Mother        leukemia  .  Diabetes Brother   . Blindness Brother   . Heart attack Maternal Grandfather   . Prostate cancer Neg Hx   . Bladder Cancer Neg Hx   . Kidney cancer Neg Hx    Social History:  reports that he quit smoking about 4 years ago. His smoking use included cigarettes. He smoked 0.00 packs per day for 10.00 years. He has never used smokeless tobacco. He reports current alcohol use. He reports that he does not use drugs.  Allergies: No Known Allergies  Medications: I have reviewed the patient's current medications. Prior to Admission medications   Medication Sig Start Date End Date Taking? Authorizing Provider  albuterol (PROVENTIL HFA;VENTOLIN HFA) 108 (90 Base) MCG/ACT inhaler Inhale 2 puffs into the lungs every 6 (six) hours as needed for wheezing or shortness of breath. 01/21/19  Yes Vallarie Mare M, PA-C  amLODipine (NORVASC) 5 MG tablet Take 5 mg by mouth daily. 01/20/20  Yes [provider]  aspirin 81 MG EC tablet Take 81 mg by mouth daily.   Yes [provider]  atorvastatin (LIPITOR) 20 MG tablet Take 20 mg by mouth daily.   Yes [provider]  Fluticasone-Salmeterol (ADVAIR) 100-50 MCG/DOSE AEPB Inhale 1 puff into the lungs 2 (two) times daily.   Yes [provider]  furosemide (LASIX) 40 MG tablet Take 40 mg by mouth daily.  02/14/20  Yes [provider]  levothyroxine (SYNTHROID) 100 MCG tablet Take 100 mcg by mouth  daily. 02/07/20  Yes [provider]  metFORMIN (GLUCOPHAGE-XR) 500 MG 24 hr tablet Take 500 mg by mouth in the morning and at bedtime.   Yes [provider]  Multiple Vitamin (MULTIVITAMIN WITH MINERALS) TABS tablet Take 1 tablet by mouth daily.   Yes [provider]  omega-3 acid ethyl esters (LOVAZA) 1 g capsule Take 2 g by mouth daily.    Yes [provider]    ROS: History obtained from the patient  General ROS: negative for - chills, fatigue, fever, night sweats, weight gain or weight  loss Psychological ROS: negative for - behavioral disorder, hallucinations, memory difficulties, mood swings or suicidal ideation Ophthalmic ROS: as noted in HPI ENT ROS: negative for - epistaxis, nasal discharge, oral lesions, sore throat, tinnitus or vertigo Allergy and Immunology ROS: negative for - hives or itchy/watery eyes Hematological and Lymphatic ROS: negative for - bleeding problems, bruising or swollen lymph nodes Endocrine ROS: negative for - galactorrhea, hair pattern changes, polydipsia/polyuria or temperature intolerance Respiratory ROS: negative for - cough, hemoptysis, shortness of breath or wheezing Cardiovascular ROS: LE edema  Gastrointestinal ROS: negative for - abdominal pain, diarrhea, hematemesis, nausea/vomiting or stool incontinence Genito-Urinary ROS: negative for - dysuria, hematuria, incontinence or urinary frequency/urgency Musculoskeletal ROS: negative for - joint swelling or muscular weakness Neurological ROS: as noted in HPI Dermatological ROS: negative for rash and skin lesion changes  Physical Examination: Blood pressure 112/75, pulse 64, temperature 98.5 F (36.9 C), temperature source Oral, resp. rate 15, height 5\' 8"  (1.727 m), weight 136.1 kg, SpO2 96 %.  HEENT-  Normocephalic, no lesions, without obvious abnormality.  Normal external eye and conjunctiva.  Normal TM's bilaterally.  Normal auditory canals and external ears. Normal external nose, mucus membranes and septum.  Normal pharynx. Cardiovascular- S1, S2 normal, pulses palpable throughout   Lungs- chest clear, no wheezing, rales, normal symmetric air entry Abdomen- soft, non-tender; bowel sounds normal; no masses,  no organomegaly Extremities- mild BLE edema Lymph-no adenopathy palpable Musculoskeletal-no joint tenderness, deformity or swelling Skin-warm and dry, no hyperpigmentation, vitiligo, or suspicious lesions  Neurological Examination   Mental Status: Alert, oriented, thought content  appropriate.  Some mild word finding difficulties noted.  Able to follow 3 step commands without difficulty. Cranial Nerves: II: Visual fields grossly normal, pupils equal, round, reactive to light and accommodation III,IV, VI: ptosis not present, extra-ocular motions intact bilaterally V,VII: smile symmetric, facial light touch sensation normal bilaterally VIII: hearing normal bilaterally IX,X: gag reflex present XI: bilateral shoulder shrug XII: midline tongue extension Motor: Right : Upper extremity   5/5    Left:     Upper extremity   5/5  Lower extremity   5/5     Lower extremity   5/5 Tone and bulk:normal tone throughout; no atrophy noted Sensory: Pinprick and light touch intact throughout, bilaterally Deep Tendon Reflexes: Symmetric throughout Plantars: Right: mute   Left: mute Cerebellar: Normal finger-to-nose and normal heel-to-shin testing bilaterally Gait: not tested due to safety concerns    Laboratory Studies:  Basic Metabolic Panel: Recent Labs  Lab 02/26/20 1053  NA 139  K 3.9  CL 101  CO2 27  GLUCOSE 171*  BUN 28*  CREATININE 1.11  CALCIUM 9.6    Liver Function Tests: Recent Labs  Lab 02/26/20 1053  AST 20  ALT 17  ALKPHOS 51  BILITOT 1.1  PROT 7.7  ALBUMIN 4.2   No results for input(s): LIPASE, AMYLASE in the last 168 hours. No results for  input(s): AMMONIA in the last 168 hours.  CBC: Recent Labs  Lab 02/26/20 1053  WBC 7.8  NEUTROABS 5.5  HGB 14.3  HCT 42.6  MCV 90.4  PLT 197    Cardiac Enzymes: No results for input(s): CKTOTAL, CKMB, CKMBINDEX, TROPONINI in the last 168 hours.  BNP: Invalid input(s): POCBNP  CBG: Recent Labs  Lab 02/26/20 1658  GLUCAP 117*    Microbiology: No results found for this or any previous visit.  Coagulation Studies: Recent Labs    02/26/20 1053  LABPROT 13.0  INR 1.0    Urinalysis: No results for input(s): COLORURINE, LABSPEC, PHURINE, GLUCOSEU, HGBUR, BILIRUBINUR, KETONESUR,  PROTEINUR, UROBILINOGEN, NITRITE, LEUKOCYTESUR in the last 168 hours.  Invalid input(s): APPERANCEUR  Lipid Panel:    Component Value Date/Time   CHOL 171 02/13/2017 0932   CHOL 209 (H) 04/12/2016 1137   TRIG 269 (H) 02/13/2017 0932   HDL 29 (L) 02/13/2017 0932   HDL 33 (L) 04/12/2016 1137   CHOLHDL 5.9 (H) 02/13/2017 0932   VLDL 54 (H) 02/13/2017 0932   LDLCALC 88 02/13/2017 0932   LDLCALC 101 (H) 04/12/2016 1137    HgbA1C:  Lab Results  Component Value Date   HGBA1C 5.6 02/13/2017    Urine Drug Screen:  No results found for: LABOPIA, COCAINSCRNUR, LABBENZ, AMPHETMU, THCU, LABBARB  Alcohol Level: No results for input(s): ETH in the last 168 hours.  Other results: EKG: sinus rhythm at 64 bpm.  Imaging: CT Angio Head W or Wo Contrast  Result Date: 02/26/2020 CLINICAL DATA:  Stroke, follow-up EXAM: CT ANGIOGRAPHY HEAD AND NECK TECHNIQUE: Multidetector CT imaging of the head and neck was performed using the standard protocol during bolus administration of intravenous contrast. Multiplanar CT image reconstructions and MIPs were obtained to evaluate the vascular anatomy. Carotid stenosis measurements (when applicable) are obtained utilizing NASCET criteria, using the distal internal carotid diameter as the denominator. CONTRAST:  20mL OMNIPAQUE IOHEXOL 350 MG/ML SOLN COMPARISON:  CT head earlier same day FINDINGS: CT HEAD FINDINGS Brain: Unchanged hypoattenuation and loss of gray differentiation in the left occipitotemporal region. There is no acute intracranial hemorrhage or new loss of gray-white differentiation. Small chronic infarct of the left precentral gyrus. Additional patchy hypoattenuation in the supratentorial white matter is nonspecific but likely reflects mild chronic microvascular ischemic changes. Ventricles are stable in size. Vascular: No new findings. Skull: No new findings. Sinuses: No new findings. Orbits: No new findings. Review of the MIP images confirms the above  findings CTA NECK FINDINGS Aortic arch: Mild calcified plaque along the aortic arch. Patent great vessel origins. Right carotid system: Patent. Mild calcified and noncalcified plaque at the ICA origin without measurable stenosis. Left carotid system: Patent.  No measurable stenosis. Vertebral arteries: Patent. Right vertebral artery is dominant. There is a diminutive left vertebral artery. Skeleton: Degenerative changes of the cervical spine. Other neck: No mass or adenopathy. Upper chest: No apical lung mass. Review of the MIP images confirms the above findings CTA HEAD FINDINGS Anterior circulation: Intracranial internal carotid arteries are patent. Anterior and middle cerebral arteries are patent. Anterior communicating artery is present. Posterior circulation: Intracranial vertebral arteries are patent. Basilar artery is patent. Posterior cerebral arteries are patent. There are bilateral posterior communicating arteries. Venous sinuses: As permitted by contrast timing, patent. Review of the MIP images confirms the above findings IMPRESSION: Evolving left occipitotemporal infarction is unchanged in appearance. No acute intracranial hemorrhage. No large vessel occlusion or hemodynamically significant stenosis. Electronically Signed   By: Malachi Carl  Patel M.D.   On: 02/26/2020 12:40   CT HEAD WO CONTRAST  Result Date: 02/26/2020 CLINICAL DATA:  Confusion EXAM: CT HEAD WITHOUT CONTRAST TECHNIQUE: Contiguous axial images were obtained from the base of the skull through the vertex without intravenous contrast. COMPARISON:  01/10/2020 FINDINGS: Brain: Loss of gray-white differentiation in the left parieto-occipital region (series 3, images 16-17). No intracranial hemorrhage or extra-axial collection. Normal ventricular size and configuration without hydrocephalus. Vascular: No hyperdense vessel or unexpected calcification. Skull: Normal. Negative for fracture or focal lesion. Sinuses/Orbits: No acute finding. Other:  None. IMPRESSION: Loss of gray-white differentiation in the left parieto-occipital region suspicious for acute to subacute infarct. No hemorrhagic component identified. Further evaluation with MRI is recommended. These results were called by telephone at the time of interpretation on 02/26/2020 at 10:46 am to provider Kerri Perches, RN, who verbally acknowledged these results. Electronically Signed   By: Davina Poke D.O.   On: 02/26/2020 10:47   CT Angio Neck W and/or Wo Contrast  Result Date: 02/26/2020 CLINICAL DATA:  Stroke, follow-up EXAM: CT ANGIOGRAPHY HEAD AND NECK TECHNIQUE: Multidetector CT imaging of the head and neck was performed using the standard protocol during bolus administration of intravenous contrast. Multiplanar CT image reconstructions and MIPs were obtained to evaluate the vascular anatomy. Carotid stenosis measurements (when applicable) are obtained utilizing NASCET criteria, using the distal internal carotid diameter as the denominator. CONTRAST:  72mL OMNIPAQUE IOHEXOL 350 MG/ML SOLN COMPARISON:  CT head earlier same day FINDINGS: CT HEAD FINDINGS Brain: Unchanged hypoattenuation and loss of gray differentiation in the left occipitotemporal region. There is no acute intracranial hemorrhage or new loss of gray-white differentiation. Small chronic infarct of the left precentral gyrus. Additional patchy hypoattenuation in the supratentorial white matter is nonspecific but likely reflects mild chronic microvascular ischemic changes. Ventricles are stable in size. Vascular: No new findings. Skull: No new findings. Sinuses: No new findings. Orbits: No new findings. Review of the MIP images confirms the above findings CTA NECK FINDINGS Aortic arch: Mild calcified plaque along the aortic arch. Patent great vessel origins. Right carotid system: Patent. Mild calcified and noncalcified plaque at the ICA origin without measurable stenosis. Left carotid system: Patent.  No measurable stenosis.  Vertebral arteries: Patent. Right vertebral artery is dominant. There is a diminutive left vertebral artery. Skeleton: Degenerative changes of the cervical spine. Other neck: No mass or adenopathy. Upper chest: No apical lung mass. Review of the MIP images confirms the above findings CTA HEAD FINDINGS Anterior circulation: Intracranial internal carotid arteries are patent. Anterior and middle cerebral arteries are patent. Anterior communicating artery is present. Posterior circulation: Intracranial vertebral arteries are patent. Basilar artery is patent. Posterior cerebral arteries are patent. There are bilateral posterior communicating arteries. Venous sinuses: As permitted by contrast timing, patent. Review of the MIP images confirms the above findings IMPRESSION: Evolving left occipitotemporal infarction is unchanged in appearance. No acute intracranial hemorrhage. No large vessel occlusion or hemodynamically significant stenosis. Electronically Signed   By: Macy Mis M.D.   On: 02/26/2020 12:40    Assessment: 69 y.o. male  with medical history significant for arthritis, gastric reflux, hypertension, obesity *(BMI 45), who presents to the emergency department for word finding difficulty, headache and visual disturbance. Headache and visual disturbance have resolved but patient continues to have some word finding difficulties.  Head CT hypodensity concerning for stroke in the left parieto-occipital region.  CTA reviewed and shows no evidence of large vessel occlusion.      Stroke Risk Factors -  hypertension  Plan: 1. HgbA1c, fasting lipid panel 2. MRI of the brain without contrast 3. PT consult, OT consult, Speech consult 4. Echocardiogram 5. Carotid dopplers 6. Prophylactic therapy-Dual antiplatelet therapy with ASA 81mg  and Plavix 75mg  for three weeks with change to Plavix 75mg  daily alone as monotherapy after that time. 7. NPO until RN stroke swallow screen 8. Telemetry monitoring 9.  Frequent neuro checks  Alexis Goodell, MD Neurology [phone] 02/26/2020, 7:32 PM

## 2020-02-26 NOTE — ED Notes (Signed)
Patient transported to MRI 

## 2020-02-26 NOTE — ED Notes (Signed)
Report given to Jeanette Caprice, [place]

## 2020-02-26 NOTE — ED Notes (Signed)
Pt given dinner tray. Pt refused insulin injection, pt states "I dont take insulin, I take metformin at home"

## 2020-02-27 ENCOUNTER — Inpatient Hospital Stay
Admit: 2020-02-27 | Discharge: 2020-02-27 | Disposition: A | Discharge status: Home / Self Care | Payer: Medicare HMO | Attending: Internal Medicine | Admitting: Internal Medicine

## 2020-02-27 DIAGNOSIS — E119 Type 2 diabetes mellitus without complications: Secondary | ICD-10-CM

## 2020-02-27 DIAGNOSIS — E039 Hypothyroidism, unspecified: Secondary | ICD-10-CM

## 2020-02-27 DIAGNOSIS — I10 Essential (primary) hypertension: Secondary | ICD-10-CM

## 2020-02-27 LAB — LIPID PANEL
Cholesterol: 137 mg/dL (ref 0–200)
HDL: 28 mg/dL — ABNORMAL LOW (ref >40)
LDL Cholesterol: 62 mg/dL (ref 0–99)
Total CHOL/HDL Ratio: 4.9 RATIO
Triglycerides: 237 mg/dL — ABNORMAL HIGH (ref <150)
VLDL: 47 mg/dL — ABNORMAL HIGH (ref 0–40)

## 2020-02-27 LAB — GLUCOSE, CAPILLARY
Glucose-Capillary: 145 mg/dL — ABNORMAL HIGH (ref 70–99)
Glucose-Capillary: 140 mg/dL — ABNORMAL HIGH (ref 70–99)
Glucose-Capillary: 140 mg/dL — ABNORMAL HIGH (ref 70–99)
Glucose-Capillary: 118 mg/dL — ABNORMAL HIGH (ref 70–99)

## 2020-02-27 LAB — SARS CORONAVIRUS 2 (TAT 6-24 HRS): SARS Coronavirus 2: NEGATIVE

## 2020-02-27 LAB — ECHOCARDIOGRAM COMPLETE
Height: 68 in
Weight: 4800 oz

## 2020-02-27 LAB — HIV ANTIBODY (ROUTINE TESTING W REFLEX): HIV Screen 4th Generation wRfx: NONREACTIVE

## 2020-02-27 LAB — HEMOGLOBIN A1C
Hgb A1c MFr Bld: 6.2 % — ABNORMAL HIGH (ref 4.8–5.6)
Mean Plasma Glucose: 131.24 mg/dL

## 2020-02-27 MED ORDER — CLOPIDOGREL BISULFATE 75 MG PO TABS
75.0000 mg | ORAL_TABLET | Freq: Every day | ORAL | Status: DC
  Administered 2020-02-27 – 2020-02-28 (×2): 75 mg via ORAL
  Filled 2020-02-27 (×2): qty 1

## 2020-02-27 MED ORDER — ASPIRIN 81 MG PO CHEW
81.0000 mg | CHEWABLE_TABLET | Freq: Every day | ORAL | Status: DC
  Administered 2020-02-27 – 2020-02-28 (×2): 81 mg via ORAL
  Filled 2020-02-27 (×2): qty 1

## 2020-02-27 MED ORDER — ENOXAPARIN SODIUM 80 MG/0.8ML ~~LOC~~ SOLN
0.5000 mg/kg | SUBCUTANEOUS | Status: DC
  Administered 2020-02-27: 21:00:00 70 mg via SUBCUTANEOUS
  Filled 2020-02-27 (×2): qty 0.8

## 2020-02-27 NOTE — Progress Notes (Signed)
PROGRESS NOTE  Derrick Mckenzie  [id number] DOB: [birth year] DOA: 02/26/2020 PCP: Sofie Hartigan, MD  Brief Narrative: Derrick Mckenzie is a 69 y.o. male with a history of obesity (BMI 49), CAD with planned CABG, aortic valve disorder with replacement planned at time of bypass, HTN, OA, GERD who presented to the ED with word finding difficulty,confusion,headache and visual disturbance that began the previous evening. Patient had a CT scan of the head without contrast which showed loss of gray-white differentiation in the left parieto-occipital region suspicious for acute to subacute infarct. No hemorrhagic component identified. CTA showed no LVO. Neurology was consulted, recommended DAPT, statin, further work up. MRI confirms both acute large PCA territory stroke and remote bilateral cerebellar infarcts.   Assessment & Plan: Principal Problem:   Acute CVA (cerebrovascular accident) [facility]) Active Problems:   Hypertension   Morbid obesity ([place])   Hypothyroidism   Type 2 diabetes mellitus without complication ([place])  Acute left PCA infarct: Suspect embolic etiology. CTA shows no evidence of LVO or hemodynamically significant stenosis.  Echocardiogram shows no cardiac source of emboli with an EF of 60-65%.  A1c 6.2, LDL 62. - Consider cardiac monitoring as outpatient - DAPT x3 weeks, then plavix 75mg  monotherapy thereafter.  - Continue statin - Outpatient OT, SLP recommended.   CAD: With plan for CABG per pt later this month.  - Will need to follow up with cardiology prior to procedure to discuss cardiac monitoring.  - Continue antiplatelets and statin as above. Not on BB.  Severe aortic stenosis: Planning AVR  - Follow up with cardiology, CTS  HTN:  - Ok to restart norvasc, HCTZ over coming 24 hours.   NIDT2DM: Last HbA1c of 6.2% suggests good glycemic control. - SSI while admitted, restart metformin at discharge.   Morbid obesity: Estimated body mass index is 45.61 kg/m as  calculated from the following:   Height as of this encounter: 5\' 8"  (1.727 m).   Weight as of this encounter: 136.1 kg.   Hypothyroidism:  - Continue synthroid.   DVT prophylaxis: Lovenox 0.5mg /kg q24h Code Status: Full Family Communication: None at bedside Disposition Plan: Home once work up complete, and therapy evaluations completed, likely 4/2.  Consultants:   Neurology, Dr. Doy Mince  Procedures:   Echocardiogram pending  Antimicrobials:  None   Subjective: Headache and visual changes have resolved, still with trouble expressing himself. No new weakness or numbness.   Objective: Vitals:   02/26/20 2115 02/26/20 2343 02/27/20 0747 02/27/20 1609  BP: 128/82 (!) 103/58 120/71 113/82  Pulse:  (!) 59 (!) 57 64  Resp:  18 16 17   Temp:  97.8 F (36.6 C) (!) 97.4 F (36.3 C) (!) 97.4 F (36.3 C)  TempSrc:  Oral Oral Oral  SpO2:  96% 96% 96%  Weight:      Height:        Intake/Output Summary (Last 24 hours) at 02/27/2020 1736 Last data filed at 02/27/2020 1437 Gross per 24 hour  Intake 240 ml  Output 225 ml  Net 15 ml   Filed Weights   02/26/20 1024  Weight: 136.1 kg    Gen: 69 y.o. male in no distress Pulm: Non-labored breathing room air. Clear to auscultation bilaterally.  CV: Regular rate and rhythm. III/VI systolic murmur most at base, no rub, or gallop. No JVD, no pedal edema. GI: Abdomen soft, non-tender, non-distended, with normoactive bowel sounds. No organomegaly or masses felt. Ext: Warm, no deformities Skin: No rashes, lesions or ulcers Neuro:  Alert and oriented. Expressive > receptive aphasia. No peripheral or cranial sensorimotor deficits. Psych: Judgement and insight appear normal. Mood & affect appropriate.   Data Reviewed: I have personally reviewed following labs and imaging studies  CBC: Recent Labs  Lab 02/26/20 1053  WBC 7.8  NEUTROABS 5.5  HGB 14.3  HCT 42.6  MCV 90.4  PLT [id number]   Basic Metabolic Panel: Recent Labs  Lab  02/26/20 1053  NA 139  K 3.9  CL 101  CO2 27  GLUCOSE 171*  BUN 28*  CREATININE 1.11  CALCIUM 9.6   GFR: Estimated Creatinine Clearance: 83.6 mL/min (by C-G formula based on SCr of 1.11 mg/dL). Liver Function Tests: Recent Labs  Lab 02/26/20 1053  AST 20  ALT 17  ALKPHOS 51  BILITOT 1.1  PROT 7.7  ALBUMIN 4.2   No results for input(s): LIPASE, AMYLASE in the last 168 hours. No results for input(s): AMMONIA in the last 168 hours. Coagulation Profile: Recent Labs  Lab 02/26/20 1053  INR 1.0   Cardiac Enzymes: No results for input(s): CKTOTAL, CKMB, CKMBINDEX, TROPONINI in the last 168 hours. BNP (last 3 results) No results for input(s): PROBNP in the last 8760 hours. HbA1C: Recent Labs    02/26/20 1053 02/27/20 0500  HGBA1C 6.3* 6.2*   CBG: Recent Labs  Lab 02/26/20 1658 02/27/20 0748 02/27/20 1114 02/27/20 1610  GLUCAP 117* 145* 140* 140*   Lipid Profile: Recent Labs    02/27/20 0500  CHOL 137  HDL 28*  LDLCALC 62  TRIG 237*  CHOLHDL 4.9   Thyroid Function Tests: No results for input(s): TSH, T4TOTAL, FREET4, T3FREE, THYROIDAB in the last 72 hours. Anemia Panel: No results for input(s): VITAMINB12, FOLATE, FERRITIN, TIBC, IRON, RETICCTPCT in the last 72 hours. Urine analysis:    Component Value Date/Time   COLORURINE YELLOW (A) 06/08/2018 1949   APPEARANCEUR CLEAR (A) 06/08/2018 1949   LABSPEC 1.027 06/08/2018 1949   PHURINE 5.0 06/08/2018 1949   GLUCOSEU NEGATIVE 06/08/2018 1949   HGBUR NEGATIVE 06/08/2018 1949   BILIRUBINUR NEGATIVE 06/08/2018 1949   KETONESUR NEGATIVE 06/08/2018 1949   PROTEINUR NEGATIVE 06/08/2018 1949   NITRITE NEGATIVE 06/08/2018 1949   LEUKOCYTESUR NEGATIVE 06/08/2018 1949   Recent Results (from the past 240 hour(s))  SARS CORONAVIRUS 2 (TAT 6-24 HRS) Nasopharyngeal Nasopharyngeal Swab     Status: None   Collection Time: 02/26/20 11:32 AM   Specimen: Nasopharyngeal Swab  Result Value Ref Range Status   SARS  Coronavirus 2 NEGATIVE NEGATIVE Final    Comment: (NOTE) SARS-CoV-2 target nucleic acids are NOT DETECTED. The SARS-CoV-2 RNA is generally detectable in upper and lower respiratory specimens during the acute phase of infection. Negative results do not preclude SARS-CoV-2 infection, do not rule out co-infections with other pathogens, and should not be used as the sole basis for treatment or other patient management decisions. Negative results must be combined with clinical observations, patient history, and epidemiological information. The expected result is Negative. Fact Sheet for Patients: SugarRoll.be Fact Sheet for Healthcare Providers: https://www.woods-mathews.com/ This test is not yet approved or cleared by the [place] FDA and  has been authorized for detection and/or diagnosis of SARS-CoV-2 by FDA under an Emergency Use Authorization (EUA). This EUA will remain  in effect (meaning this test can be used) for the duration of the COVID-19 declaration under Section 56 4(b)(1) of the Act, 21 U.S.C. section 360bbb-3(b)(1), unless the authorization is terminated or revoked sooner. Performed at [facility], [address] [place],  [address]       Radiology Studies: CT Angio Head W or Wo Contrast  Result Date: 02/26/2020 CLINICAL DATA:  Stroke, follow-up EXAM: CT ANGIOGRAPHY HEAD AND NECK TECHNIQUE: Multidetector CT imaging of the head and neck was performed using the standard protocol during bolus administration of intravenous contrast. Multiplanar CT image reconstructions and MIPs were obtained to evaluate the vascular anatomy. Carotid stenosis measurements (when applicable) are obtained utilizing NASCET criteria, using the distal internal carotid diameter as the denominator. CONTRAST:  78mL OMNIPAQUE IOHEXOL 350 MG/ML SOLN COMPARISON:  CT head earlier same day FINDINGS: CT HEAD FINDINGS Brain: Unchanged hypoattenuation and  loss of gray differentiation in the left occipitotemporal region. There is no acute intracranial hemorrhage or new loss of gray-white differentiation. Small chronic infarct of the left precentral gyrus. Additional patchy hypoattenuation in the supratentorial white matter is nonspecific but likely reflects mild chronic microvascular ischemic changes. Ventricles are stable in size. Vascular: No new findings. Skull: No new findings. Sinuses: No new findings. Orbits: No new findings. Review of the MIP images confirms the above findings CTA NECK FINDINGS Aortic arch: Mild calcified plaque along the aortic arch. Patent great vessel origins. Right carotid system: Patent. Mild calcified and noncalcified plaque at the ICA origin without measurable stenosis. Left carotid system: Patent.  No measurable stenosis. Vertebral arteries: Patent. Right vertebral artery is dominant. There is a diminutive left vertebral artery. Skeleton: Degenerative changes of the cervical spine. Other neck: No mass or adenopathy. Upper chest: No apical lung mass. Review of the MIP images confirms the above findings CTA HEAD FINDINGS Anterior circulation: Intracranial internal carotid arteries are patent. Anterior and middle cerebral arteries are patent. Anterior communicating artery is present. Posterior circulation: Intracranial vertebral arteries are patent. Basilar artery is patent. Posterior cerebral arteries are patent. There are bilateral posterior communicating arteries. Venous sinuses: As permitted by contrast timing, patent. Review of the MIP images confirms the above findings IMPRESSION: Evolving left occipitotemporal infarction is unchanged in appearance. No acute intracranial hemorrhage. No large vessel occlusion or hemodynamically significant stenosis. Electronically Signed   By: Macy Mis M.D.   On: 02/26/2020 12:40   CT HEAD WO CONTRAST  Result Date: 02/26/2020 CLINICAL DATA:  Confusion EXAM: CT HEAD WITHOUT CONTRAST  TECHNIQUE: Contiguous axial images were obtained from the base of the skull through the vertex without intravenous contrast. COMPARISON:  01/10/2020 FINDINGS: Brain: Loss of gray-white differentiation in the left parieto-occipital region (series 3, images 16-17). No intracranial hemorrhage or extra-axial collection. Normal ventricular size and configuration without hydrocephalus. Vascular: No hyperdense vessel or unexpected calcification. Skull: Normal. Negative for fracture or focal lesion. Sinuses/Orbits: No acute finding. Other: None. IMPRESSION: Loss of gray-white differentiation in the left parieto-occipital region suspicious for acute to subacute infarct. No hemorrhagic component identified. Further evaluation with MRI is recommended. These results were called by telephone at the time of interpretation on 02/26/2020 at 10:46 am to provider Kerri Perches, RN, who verbally acknowledged these results. Electronically Signed   By: Davina Poke D.O.   On: 02/26/2020 10:47   CT Angio Neck W and/or Wo Contrast  Result Date: 02/26/2020 CLINICAL DATA:  Stroke, follow-up EXAM: CT ANGIOGRAPHY HEAD AND NECK TECHNIQUE: Multidetector CT imaging of the head and neck was performed using the standard protocol during bolus administration of intravenous contrast. Multiplanar CT image reconstructions and MIPs were obtained to evaluate the vascular anatomy. Carotid stenosis measurements (when applicable) are obtained utilizing NASCET criteria, using the distal internal carotid diameter as  the denominator. CONTRAST:  76mL OMNIPAQUE IOHEXOL 350 MG/ML SOLN COMPARISON:  CT head earlier same day FINDINGS: CT HEAD FINDINGS Brain: Unchanged hypoattenuation and loss of gray differentiation in the left occipitotemporal region. There is no acute intracranial hemorrhage or new loss of gray-white differentiation. Small chronic infarct of the left precentral gyrus. Additional patchy hypoattenuation in the supratentorial white matter is  nonspecific but likely reflects mild chronic microvascular ischemic changes. Ventricles are stable in size. Vascular: No new findings. Skull: No new findings. Sinuses: No new findings. Orbits: No new findings. Review of the MIP images confirms the above findings CTA NECK FINDINGS Aortic arch: Mild calcified plaque along the aortic arch. Patent great vessel origins. Right carotid system: Patent. Mild calcified and noncalcified plaque at the ICA origin without measurable stenosis. Left carotid system: Patent.  No measurable stenosis. Vertebral arteries: Patent. Right vertebral artery is dominant. There is a diminutive left vertebral artery. Skeleton: Degenerative changes of the cervical spine. Other neck: No mass or adenopathy. Upper chest: No apical lung mass. Review of the MIP images confirms the above findings CTA HEAD FINDINGS Anterior circulation: Intracranial internal carotid arteries are patent. Anterior and middle cerebral arteries are patent. Anterior communicating artery is present. Posterior circulation: Intracranial vertebral arteries are patent. Basilar artery is patent. Posterior cerebral arteries are patent. There are bilateral posterior communicating arteries. Venous sinuses: As permitted by contrast timing, patent. Review of the MIP images confirms the above findings IMPRESSION: Evolving left occipitotemporal infarction is unchanged in appearance. No acute intracranial hemorrhage. No large vessel occlusion or hemodynamically significant stenosis. Electronically Signed   By: Macy Mis M.D.   On: 02/26/2020 12:40   MR BRAIN WO CONTRAST  Result Date: 02/26/2020 CLINICAL DATA:  Word-finding difficulty and vision changes. EXAM: MRI HEAD WITHOUT CONTRAST TECHNIQUE: Multiplanar, multiecho pulse sequences of the brain and surrounding structures were obtained without intravenous contrast. COMPARISON:  Head CT and CTA head neck 02/26/2020 FINDINGS: Brain: Large left PCA territory infarct predominantly  involving the lateral left temporal lobe. No other site of diffusion abnormality. There are old bilateral cerebellar small vessel infarcts. Small, old left frontal infarct along the precentral gyrus. Multifocal white matter hyperintensity, most commonly due to chronic ischemic microangiopathy. Normal volume of CSF spaces. No chronic microhemorrhage. Normal midline structures. Vascular: Normal flow voids. Skull and upper cervical spine: Normal marrow signal. Sinuses/Orbits: Negative. Other: None. IMPRESSION: 1. Large left PCA territory infarct predominantly involving the lateral left temporal lobe. No acute hemorrhage or mass effect. 2. Old bilateral cerebellar and left frontal infarcts. Electronically Signed   By: Ulyses Jarred M.D.   On: 02/26/2020 19:54   ECHOCARDIOGRAM COMPLETE  Result Date: 02/27/2020    ECHOCARDIOGRAM REPORT   Patient Name:   Derrick Mckenzie Date of Exam: 02/27/2020 Medical Rec #:  [id number]         Height:       68.0 in Accession #:    [id number]        Weight:       300.0 lb Date of Birth:  May 17, 1951         BSA:          2.428 m Patient Age:    69 years          BP:           120/71 mmHg Patient Gender: M                 HR:  57 bpm. Exam Location:  [facility] Procedure: 2D Echo, Cardiac Doppler and Color Doppler Indications:     Stroke 434.91  History:         Patient has no prior history of Echocardiogram examinations.                  Signs/Symptoms:Murmur; Risk Factors:Hypertension and Sleep                  Apnea.  Sonographer:     Sherrie Sport RDCS (AE) Referring Phys:  [id number] Royce Macadamia AGBATA Diagnosing Phys: Bartholome Bill MD IMPRESSIONS  1. Left ventricular ejection fraction, by estimation, is 60 to 65%. The left ventricle has normal function. The left ventricle has no regional wall motion abnormalities. Left ventricular diastolic parameters are consistent with Grade I diastolic dysfunction (impaired relaxation).  2. Right ventricular systolic function is normal. The right  ventricular size is mildly enlarged. There is normal pulmonary artery systolic pressure.  3. Left atrial size was mildly dilated.  4. The mitral valve was not well visualized. Mild mitral valve regurgitation.  5. Tricuspid valve regurgitation is mild to moderate.  6. The aortic valve was not well visualized. Aortic valve regurgitation is mild. Severe aortic valve stenosis.  7. Aortic dilatation noted. FINDINGS  Left Ventricle: Left ventricular ejection fraction, by estimation, is 60 to 65%. The left ventricle has normal function. The left ventricle has no regional wall motion abnormalities. The left ventricular internal cavity size was normal in size. There is  borderline left ventricular hypertrophy. Left ventricular diastolic parameters are consistent with Grade I diastolic dysfunction (impaired relaxation). Right Ventricle: The right ventricular size is mildly enlarged. No increase in right ventricular wall thickness. Right ventricular systolic function is normal. There is normal pulmonary artery systolic pressure. The tricuspid regurgitant velocity is 1.64  m/s, and with an assumed right atrial pressure of 10 mmHg, the estimated right ventricular systolic pressure is [id number] mmHg. Left Atrium: Left atrial size was mildly dilated. Right Atrium: Right atrial size was normal in size. Pericardium: There is no evidence of pericardial effusion. Mitral Valve: The mitral valve was not well visualized. Mild mitral valve regurgitation. Tricuspid Valve: The tricuspid valve is not well visualized. Tricuspid valve regurgitation is mild to moderate. Aortic Valve: The aortic valve was not well visualized. . There is severe thickening and severe calcifcation of the aortic valve. Aortic valve regurgitation is mild. Severe aortic stenosis is present. There is severe thickening of the aortic valve. There  is severe calcifcation of the aortic valve. Aortic valve mean gradient measures 41.4 mmHg. Aortic valve peak gradient measures  44.5 mmHg. Aortic valve area, by VTI measures 0.82 cm. Pulmonic Valve: The pulmonic valve was not well visualized. Pulmonic valve regurgitation is trivial. Aorta: Aortic dilatation noted. IAS/Shunts: The interatrial septum was not assessed.  LEFT VENTRICLE PLAX 2D LVIDd:         5.03 cm  Diastology LVIDs:         2.83 cm  LV e' lateral:   4.68 cm/s LV PW:         1.20 cm  LV E/e' lateral: 15.3 LV IVS:        1.17 cm  LV e' medial:    5.66 cm/s LVOT diam:     2.00 cm  LV E/e' medial:  12.7 LV SV:         64 LV SV Index:   27 LVOT Area:     3.14 cm  RIGHT VENTRICLE RV Basal  diam:  4.74 cm RV S prime:     11.90 cm/s TAPSE (M-mode): 4.6 cm LEFT ATRIUM             Index       RIGHT ATRIUM           Index LA diam:        4.00 cm 1.65 cm/m  RA Area:     23.20 cm LA Vol (A2C):   91.9 ml 37.85 ml/m RA Volume:   78.50 ml  32.33 ml/m LA Vol (A4C):   63.7 ml 26.23 ml/m LA Biplane Vol: 77.5 ml 31.92 ml/m  AORTIC VALVE                    PULMONIC VALVE AV Area (Vmax):    0.79 cm     PV Vmax:        0.88 m/s AV Area (Vmean):   0.69 cm     PV Peak grad:   3.1 mmHg AV Area (VTI):     0.82 cm     RVOT Peak grad: 4 mmHg AV Vmax:           333.72 cm/s AV Vmean:          268.320 cm/s AV VTI:            0.781 m AV Peak Grad:      44.5 mmHg AV Mean Grad:      41.4 mmHg LVOT Vmax:         84.20 cm/s LVOT Vmean:        58.700 cm/s LVOT VTI:          0.205 m LVOT/AV VTI ratio: 0.26  AORTA Ao Root diam: 3.10 cm MITRAL VALVE                TRICUSPID VALVE MV Area (PHT): 2.66 cm     TR Peak grad:   10.8 mmHg MV Decel Time: 285 msec     TR Vmax:        164.00 cm/s MV E velocity: 71.80 cm/s MV A velocity: 106.00 cm/s  SHUNTS MV E/A ratio:  0.68         Systemic VTI:  0.20 m                             Systemic Diam: 2.00 cm Bartholome Bill MD Electronically signed by Bartholome Bill MD Signature Date/Time: 02/27/2020/12:20:21 PM    Final     Scheduled Meds: . aspirin  81 mg Oral Daily  . atorvastatin  80 mg Oral q1800  . clopidogrel  75  mg Oral Daily  . insulin aspart  0-20 Units Subcutaneous TID WC  . insulin aspart  6 Units Subcutaneous TID WC  . levothyroxine  88 mcg Oral Daily  . multivitamin with minerals  1 tablet Oral Daily   Continuous Infusions:   LOS: 1 day   Time spent: 25 minutes.  Patrecia Pour, MD [facility] [email or web address] 02/27/2020, 5:36 PM

## 2020-02-27 NOTE — Evaluation (Addendum)
Speech Language Pathology Evaluation Patient Details Name: Derrick Mckenzie MRN: [id number] DOB: 18-Jan-1951 Today's Date: 02/27/2020 Time: 1500-1600 SLP Time Calculation (min) (ACUTE ONLY): 60 min  Problem List:  Patient Active Problem List   Diagnosis Date Noted  . Acute CVA (cerebrovascular accident) ([place]) 02/26/2020  . Type 2 diabetes mellitus without complication ([place]) [id number]  . Iliac artery aneurysm ([place]) 05/07/2019  . Prostate cancer screening 03/16/2017  . Full code status 03/16/2017  . Preventative health care 03/16/2017  . Need for hepatitis C screening test 10/14/2016  . Prediabetes 10/14/2016  . Peptic ulcer of stomach   . Calculus of gallbladder without cholecystitis without obstruction 09/27/2016  . Right nephrolithiasis 09/27/2016  . Status post right knee replacement 09/08/2016  . Medication monitoring encounter 10/30/2015  . Sleep pattern disturbance 06/26/2015  . Hx of smoking 06/26/2015  . Need for prophylactic vaccination against Streptococcus pneumoniae (pneumococcus) 06/26/2015  . Hypertension   . Hyperlipidemia   . Morbid obesity ([place])   . Hypothyroidism   . Degenerative arthritis of knee, bilateral 06/12/2015  . Chest pain, atypical 02/04/2015   Past Medical History:  Past Medical History:  Diagnosis Date  . Arthritis    knees  . Complication of anesthesia    aggitation after knee surgery  . Family history of adverse reaction to anesthesia    brother - hallucinations  . GERD (gastroesophageal reflux disease)   . Hx of smoking 06/26/2015  . Hyperlipidemia   . Hypertension   . Hypothyroidism   . Kidney stone   . Morbid obesity ([place])   . Murmur   . Prediabetes 10/14/2016  . Sleep apnea    score of 5 on apnea screen   Past Surgical History:  Past Surgical History:  Procedure Laterality Date  . ESOPHAGOGASTRODUODENOSCOPY (EGD) WITH PROPOFOL N/A 10/05/2016   Procedure: ESOPHAGOGASTRODUODENOSCOPY (EGD) WITH PROPOFOL;  Surgeon: Jonathon Bellows, MD;   Location: [facility];  Service: Endoscopy;  Laterality: N/A;  sleep apnea  . HERNIA REPAIR    . REPLACEMENT TOTAL KNEE Right 2017  . TOTAL KNEE ARTHROPLASTY  1973   HPI:  Derrick Mckenzie is a 37yoM who admitted to [facility] on 3/31 w/ acute onset word finding difficulties, HA, visual changes. Pt reports difficulty with Rt eye vision and wife reports some AMS as well. MRI revealed a Large left PCA territory infarct predominantly involving the lateral left temporal lobe. No acute hemorrhage or mass effect. PMH: arthritis, gastric reflux, hypertension, Obesity. MRI also showing remote bilateral cerebellar and left frontal infarcts.   Assessment / Plan / Recommendation Clinical Impression  Pt appears to present w/ Moderate Expressive and Receptive Aphasia w/ Dysfluency of speech c/b hesitancy of initiation and word finding during verbal responses/tasks and conversation. MRI revealed a Large Left PCA territory infarct predominantly involving the lateral left temporal lobe. No acute hemorrhage or mass effect. No reports of Dysphagia by pt or NSG. Pt was awake/alert, and easily engaged in conversation and interaction w/ others in room including SLP - good sense of humor. During Language tasks, pt exhibited both Phonemic and Semantic paraphasias during speech tasks; pt was inconsistently aware of errors and made attempts to correct when directed. Given phonemic and semantic cues, pt was able to complete tasks and/or increase accuracy of his responses. Spontaneous speech tasks were accurate, but min hesitation to initiate speech tasks noted. Pt exhibited this hesitation across the board w/ all tasks stating "I didn't get what you meant for me to do". Once assured or  clear about task, he immediately attempt response. He most often verbally communicated at the sentence/phrase level c/b normal length and complexity. During receptive tasks, pt exhibited increased errors as the task increased in  complexity(3-step commands, word repetition at sentence level, complex y/n questions). Object naming was intact w/ intermittent phonemic paraphasias; few self-corrections. Pt is able to ID single letters and read at the word level w/ phonemic errors noted w/ more complex words. Pt exhibited adequate texting ability but could not Read texts sent to him(sentence level Reading). Pt also noted that "some words have a "Y" added to the end of them". Motor speech and Cognitive abilities appear WFL; No OM weakness noted, No Dysarthria. Pt's functional communication for ADLs is impacted by his Aphasia. Pt will benefit from Outpatient Speech therapy to address expressive and receptive language abilities and learn compensatory strategies in order to improve communication in ADLs. Handouts given.    SLP Assessment  SLP Recommendation/Assessment: All further Speech Lanaguage Pathology  needs can be addressed in the next venue of care SLP Visit Diagnosis: Aphasia (R47.01)    Follow Up Recommendations  Outpatient SLP    Frequency and Duration  n/a        SLP Evaluation Cognition  Overall Cognitive Status: Within Functional Limits for tasks assessed(expressive deficits impacted output) Arousal/Alertness: Awake/alert Orientation Level: Oriented X4 Attention: Focused;Sustained Focused Attention: Appears intact Sustained Attention: Appears intact Memory: (NT) Awareness: Appears intact Problem Solving: Appears intact Executive Function: Decision Making Decision Making: Appears intact Behaviors: Poor frustration tolerance(getting his words out) Safety/Judgment: Appears intact Comments: in discussion of needing monitoring moving about in room to prevent a fall       Comprehension  Auditory Comprehension Overall Auditory Comprehension: Impaired Yes/No Questions: Impaired(w/ complex questions) Commands: Impaired(1x w/ 2-step; 1/3 accuracy w/ 3-step) Conversation: Simple Interfering Components:  Processing speed(initiation) EffectiveTechniques: Extra processing time;Pausing Visual Recognition/Discrimination Discrimination: Not tested Reading Comprehension Reading Status: Impaired Word level: Within functional limits Sentence Level: Impaired Paragraph Level: Impaired Functional Environmental (signs, name badge): Within functional limits Interfering Components: Processing time(some words have a "Y" added to the end)    Expression Expression Primary Mode of Expression: Verbal Verbal Expression Overall Verbal Expression: Impaired Initiation: Impaired Automatic Speech: Name;Social Response;Counting;Day of week;Month of year(all accurate post min hesitation initiating tasks) Level of Generative/Spontaneous Verbalization: Phrase Repetition: Impaired Level of Impairment: Sentence level Naming: Impairment Responsive: 76-100% accurate Confrontation: Impaired Convergent: 50-74% accurate Divergent: 50-74% accurate Other Naming Comments: both phonemic and semantic paraphasias noted Verbal Errors: Semantic paraphasias;Phonemic paraphasias;Aware of errors;Not aware of errors Pragmatics: No impairment Interfering Components: (n/a) Effective Techniques: Semantic cues;Phonemic cues(Repetition of task, instruction) Non-Verbal Means of Communication: Not applicable Written Expression Dominant Hand: Right Written Expression: Not tested   Oral / Motor  Oral Motor/Sensory Function Overall Oral Motor/Sensory Function: Within functional limits Motor Speech Overall Motor Speech: Appears within functional limits for tasks assessed Respiration: Within functional limits Phonation: Normal Resonance: Within functional limits Articulation: Within functional limitis Intelligibility: Intelligible Motor Planning: Witnin functional limits Motor Speech Errors: Not applicable Interfering Components: (n/a)   GO                      Orinda Kenner, MS, CCC-SLP Viveca Beckstrom 02/27/2020,  4:57 PM

## 2020-02-27 NOTE — Evaluation (Signed)
Physical Therapy Evaluation Patient Details Name: Derrick Mckenzie MRN: [id number] DOB: 1951/05/19 Today's Date: 02/27/2020   History of Present Illness  Derrick Mckenzie is a 16yoM who comes to [facility] on 3/31 c acute onset anomia, HA, visual changes. Pt reports difficulty with Rt eye vision and wife reports some AMS as well. [place] then later MRI both revealing of Large left PCA territory infarct predominantly involving the lateral left temporal lobe. No acute hemorrhage or mass effect. PMH: arthritis, gastric reflux, hypertension, obesity. MRI also showing remote bilateral cerebellar and left frontal infarcts.  Clinical Impression  Pt admitted with above diagnosis. Pt currently with functional limitations due to the deficits listed below (see "PT Problem List"). Upon entry, pt in bed, awake and agreeable to participate. The pt is alert and oriented x4, pleasant, conversational, and generally a good historian. Pt still has some anomia, he estimates not quite 50% improved since onset. He denies any involvement of his strength, balance, or sensation in limbs. Pt able to perform all mobility independently, assistance provided in gait for lines/leads. Pt has some SOB with AMB around the unit which he reports is related to a cardiac issue for which he is currently scheduled (PTA). Patient is at baseline for gross mobiltiy, all education completed, and time is given to address all questions/concerns. No additional skilled PT services needed at this time, PT signing off. PT recommends daily ambulation ad lib or with nursing staff as needed to prevent deconditioning.      Follow Up Recommendations No PT follow up    Equipment Recommendations  None recommended by PT    Recommendations for Other Services       Precautions / Restrictions Precautions Precautions: None Restrictions Weight Bearing Restrictions: No      Mobility  Bed Mobility Overal bed mobility: Independent                 Transfers Overall transfer level: Independent                  Ambulation/Gait Ambulation/Gait assistance: Independent Gait Distance (Feet): 350 Feet(distance limited by chronic cardiac issues (scheduled for surgery)) Assistive device: None   Gait velocity: 0.41m/s      Stairs            Wheelchair Mobility    Modified Rankin (Stroke Patients Only)       Balance Overall balance assessment: Independent                                           Pertinent Vitals/Pain Pain Assessment: No/denies pain    Home Living Family/patient expects to be discharged to:: Private residence Living Arrangements: Spouse/significant other Available Help at Discharge: Family Type of Home: House Home Access: Level entry     Home Layout: One level Home Equipment: None      Prior Function Level of Independence: Independent               Hand Dominance        Extremity/Trunk Assessment   Upper Extremity Assessment Upper Extremity Assessment: Overall WFL for tasks assessed    Lower Extremity Assessment Lower Extremity Assessment: Overall WFL for tasks assessed       Communication      Cognition Arousal/Alertness: Awake/alert Behavior During Therapy: WFL for tasks assessed/performed Overall Cognitive Status: Within Functional Limits for tasks assessed  General Comments      Exercises     Assessment/Plan    PT Assessment Patent does not need any further PT services  PT Problem List Decreased activity tolerance       PT Treatment Interventions      PT Goals (Current goals can be found in the Care Plan section)  Acute Rehab PT Goals PT Goal Formulation: All assessment and education complete, DC therapy    Frequency     Barriers to discharge        Co-evaluation               AM-PAC PT "6 Clicks" Mobility  Outcome Measure Help needed turning from your  back to your side while in a flat bed without using bedrails?: None Help needed moving from lying on your back to sitting on the side of a flat bed without using bedrails?: None Help needed moving to and from a bed to a chair (including a wheelchair)?: None Help needed standing up from a chair using your arms (e.g., wheelchair or bedside chair)?: None Help needed to walk in hospital room?: None Help needed climbing 3-5 steps with a railing? : A Little 6 Click Score: 23    End of Session   Activity Tolerance: Patient tolerated treatment well;No increased pain;Patient limited by fatigue Patient left: in chair;with call bell/phone within reach;with chair alarm set Nurse Communication: Mobility status PT Visit Diagnosis: Difficulty in walking, not elsewhere classified (R26.2)    Time: [id number] PT Time Calculation (min) (ACUTE ONLY): 21 min   Charges:   PT Evaluation $PT Eval Moderate Complexity: 1 Mod          12:18 PM, 02/27/20 Etta Grandchild, PT, DPT Physical Therapist - [facility]  [phone] ([place])    [place]ilo[place] C 02/27/2020, 12:04 PM

## 2020-02-27 NOTE — Progress Notes (Signed)
Subjective: No new neurological complaints.  Continues to have some word finding issues at times.  Objective: Current vital signs: BP 120/71 (BP Location: Left Arm)   Pulse (!) 57   Temp (!) 97.4 F (36.3 C) (Oral)   Resp 16   Ht 5\' 8"  (1.727 m)   Wt 136.1 kg   SpO2 96%   BMI 45.61 kg/m  Vital signs in last 24 hours: Temp:  [97.4 F (36.3 C)-97.8 F (36.6 C)] 97.4 F (36.3 C) (04/01 0747) Pulse Rate:  [57-71] 57 (04/01 0747) Resp:  [11-18] 16 (04/01 0747) BP: (102-128)/(58-93) 120/71 (04/01 0747) SpO2:  [94 %-97 %] 96 % (04/01 0747)  Intake/Output from previous day: 03/31 0701 - 04/01 0700 In: -  Out: 225 [Urine:225] Intake/Output this shift: No intake/output data recorded. Nutritional status:  Diet Order            Diet heart healthy/carb modified Room service appropriate? Yes; Fluid consistency: Thin  Diet effective now              Neurologic Exam: Mental Status: Alert, oriented, thought content appropriate.  Some mild word finding difficulties noted.  Able to follow 3 step commands without difficulty. Cranial Nerves: II: Visual fields grossly normal, pupils equal, round, reactive to light and accommodation III,IV, VI: ptosis not present, extra-ocular motions intact bilaterally V,VII: smile symmetric, facial light touch sensation normal bilaterally VIII: hearing normal bilaterally IX,X: gag reflex present XI: bilateral shoulder shrug XII: midline tongue extension Motor: 5/5 thruoghout Sensory: Pinprick and light touch intact throughout, bilaterally   Lab Results: Basic Metabolic Panel: Recent Labs  Lab 02/26/20 1053  NA 139  K 3.9  CL 101  CO2 27  GLUCOSE 171*  BUN 28*  CREATININE 1.11  CALCIUM 9.6    Liver Function Tests: Recent Labs  Lab 02/26/20 1053  AST 20  ALT 17  ALKPHOS 51  BILITOT 1.1  PROT 7.7  ALBUMIN 4.2   No results for input(s): LIPASE, AMYLASE in the last 168 hours. No results for input(s): AMMONIA in the last 168  hours.  CBC: Recent Labs  Lab 02/26/20 1053  WBC 7.8  NEUTROABS 5.5  HGB 14.3  HCT 42.6  MCV 90.4  PLT 197    Cardiac Enzymes: No results for input(s): CKTOTAL, CKMB, CKMBINDEX, TROPONINI in the last 168 hours.  Lipid Panel: Recent Labs  Lab 02/27/20 0500  CHOL 137  TRIG 237*  HDL 28*  CHOLHDL 4.9  VLDL 47*  LDLCALC 62    CBG: Recent Labs  Lab 02/26/20 1658 02/27/20 0748  GLUCAP 117* 145*    Microbiology: Results for orders placed or performed during the hospital encounter of 02/26/20  SARS CORONAVIRUS 2 (TAT 6-24 HRS) Nasopharyngeal Nasopharyngeal Swab     Status: None   Collection Time: 02/26/20 11:32 AM   Specimen: Nasopharyngeal Swab  Result Value Ref Range Status   SARS Coronavirus 2 NEGATIVE NEGATIVE Final    Comment: (NOTE) SARS-CoV-2 target nucleic acids are NOT DETECTED. The SARS-CoV-2 RNA is generally detectable in upper and lower respiratory specimens during the acute phase of infection. Negative results do not preclude SARS-CoV-2 infection, do not rule out co-infections with other pathogens, and should not be used as the sole basis for treatment or other patient management decisions. Negative results must be combined with clinical observations, patient history, and epidemiological information. The expected result is Negative. Fact Sheet for Patients: SugarRoll.be Fact Sheet for Healthcare Providers: https://www.woods-mathews.com/ This test is not yet approved or cleared  by the [place] and  has been authorized for detection and/or diagnosis of SARS-CoV-2 by FDA under an Emergency Use Authorization (EUA). This EUA will remain  in effect (meaning this test can be used) for the duration of the COVID-19 declaration under Section 56 4(b)(1) of the Act, 21 U.S.C. section 360bbb-3(b)(1), unless the authorization is terminated or revoked sooner. Performed at [facility], [place] [address],  [place], [place] [address]     Coagulation Studies: Recent Labs    02/26/20 1053  LABPROT 13.0  INR 1.0    Imaging: CT Angio Head W or Wo Contrast  Result Date: 02/26/2020 CLINICAL DATA:  Stroke, follow-up EXAM: CT ANGIOGRAPHY HEAD AND NECK TECHNIQUE: Multidetector CT imaging of the head and neck was performed using the standard protocol during bolus administration of intravenous contrast. Multiplanar CT image reconstructions and MIPs were obtained to evaluate the vascular anatomy. Carotid stenosis measurements (when applicable) are obtained utilizing NASCET criteria, using the distal internal carotid diameter as the denominator. CONTRAST:  17mL OMNIPAQUE IOHEXOL 350 MG/ML SOLN COMPARISON:  CT head earlier same day FINDINGS: CT HEAD FINDINGS Brain: Unchanged hypoattenuation and loss of gray differentiation in the left occipitotemporal region. There is no acute intracranial hemorrhage or new loss of gray-white differentiation. Small chronic infarct of the left precentral gyrus. Additional patchy hypoattenuation in the supratentorial white matter is nonspecific but likely reflects mild chronic microvascular ischemic changes. Ventricles are stable in size. Vascular: No new findings. Skull: No new findings. Sinuses: No new findings. Orbits: No new findings. Review of the MIP images confirms the above findings CTA NECK FINDINGS Aortic arch: Mild calcified plaque along the aortic arch. Patent great vessel origins. Right carotid system: Patent. Mild calcified and noncalcified plaque at the ICA origin without measurable stenosis. Left carotid system: Patent.  No measurable stenosis. Vertebral arteries: Patent. Right vertebral artery is dominant. There is a diminutive left vertebral artery. Skeleton: Degenerative changes of the cervical spine. Other neck: No mass or adenopathy. Upper chest: No apical lung mass. Review of the MIP images confirms the above findings CTA HEAD FINDINGS Anterior circulation: Intracranial  internal carotid arteries are patent. Anterior and middle cerebral arteries are patent. Anterior communicating artery is present. Posterior circulation: Intracranial vertebral arteries are patent. Basilar artery is patent. Posterior cerebral arteries are patent. There are bilateral posterior communicating arteries. Venous sinuses: As permitted by contrast timing, patent. Review of the MIP images confirms the above findings IMPRESSION: Evolving left occipitotemporal infarction is unchanged in appearance. No acute intracranial hemorrhage. No large vessel occlusion or hemodynamically significant stenosis. Electronically Signed   By: Macy Mis M.D.   On: 02/26/2020 12:40   CT HEAD WO CONTRAST  Result Date: 02/26/2020 CLINICAL DATA:  Confusion EXAM: CT HEAD WITHOUT CONTRAST TECHNIQUE: Contiguous axial images were obtained from the base of the skull through the vertex without intravenous contrast. COMPARISON:  01/10/2020 FINDINGS: Brain: Loss of gray-white differentiation in the left parieto-occipital region (series 3, images 16-17). No intracranial hemorrhage or extra-axial collection. Normal ventricular size and configuration without hydrocephalus. Vascular: No hyperdense vessel or unexpected calcification. Skull: Normal. Negative for fracture or focal lesion. Sinuses/Orbits: No acute finding. Other: None. IMPRESSION: Loss of gray-white differentiation in the left parieto-occipital region suspicious for acute to subacute infarct. No hemorrhagic component identified. Further evaluation with MRI is recommended. These results were called by telephone at the time of interpretation on 02/26/2020 at 10:46 am to provider Kerri Perches, RN, who verbally acknowledged these results. Electronically Signed  By: Davina Poke D.O.   On: 02/26/2020 10:47   CT Angio Neck W and/or Wo Contrast  Result Date: 02/26/2020 CLINICAL DATA:  Stroke, follow-up EXAM: CT ANGIOGRAPHY HEAD AND NECK TECHNIQUE: Multidetector CT imaging of  the head and neck was performed using the standard protocol during bolus administration of intravenous contrast. Multiplanar CT image reconstructions and MIPs were obtained to evaluate the vascular anatomy. Carotid stenosis measurements (when applicable) are obtained utilizing NASCET criteria, using the distal internal carotid diameter as the denominator. CONTRAST:  5mL OMNIPAQUE IOHEXOL 350 MG/ML SOLN COMPARISON:  CT head earlier same day FINDINGS: CT HEAD FINDINGS Brain: Unchanged hypoattenuation and loss of gray differentiation in the left occipitotemporal region. There is no acute intracranial hemorrhage or new loss of gray-white differentiation. Small chronic infarct of the left precentral gyrus. Additional patchy hypoattenuation in the supratentorial white matter is nonspecific but likely reflects mild chronic microvascular ischemic changes. Ventricles are stable in size. Vascular: No new findings. Skull: No new findings. Sinuses: No new findings. Orbits: No new findings. Review of the MIP images confirms the above findings CTA NECK FINDINGS Aortic arch: Mild calcified plaque along the aortic arch. Patent great vessel origins. Right carotid system: Patent. Mild calcified and noncalcified plaque at the ICA origin without measurable stenosis. Left carotid system: Patent.  No measurable stenosis. Vertebral arteries: Patent. Right vertebral artery is dominant. There is a diminutive left vertebral artery. Skeleton: Degenerative changes of the cervical spine. Other neck: No mass or adenopathy. Upper chest: No apical lung mass. Review of the MIP images confirms the above findings CTA HEAD FINDINGS Anterior circulation: Intracranial internal carotid arteries are patent. Anterior and middle cerebral arteries are patent. Anterior communicating artery is present. Posterior circulation: Intracranial vertebral arteries are patent. Basilar artery is patent. Posterior cerebral arteries are patent. There are bilateral  posterior communicating arteries. Venous sinuses: As permitted by contrast timing, patent. Review of the MIP images confirms the above findings IMPRESSION: Evolving left occipitotemporal infarction is unchanged in appearance. No acute intracranial hemorrhage. No large vessel occlusion or hemodynamically significant stenosis. Electronically Signed   By: Macy Mis M.D.   On: 02/26/2020 12:40   MR BRAIN WO CONTRAST  Result Date: 02/26/2020 CLINICAL DATA:  Word-finding difficulty and vision changes. EXAM: MRI HEAD WITHOUT CONTRAST TECHNIQUE: Multiplanar, multiecho pulse sequences of the brain and surrounding structures were obtained without intravenous contrast. COMPARISON:  Head CT and CTA head neck 02/26/2020 FINDINGS: Brain: Large left PCA territory infarct predominantly involving the lateral left temporal lobe. No other site of diffusion abnormality. There are old bilateral cerebellar small vessel infarcts. Small, old left frontal infarct along the precentral gyrus. Multifocal white matter hyperintensity, most commonly due to chronic ischemic microangiopathy. Normal volume of CSF spaces. No chronic microhemorrhage. Normal midline structures. Vascular: Normal flow voids. Skull and upper cervical spine: Normal marrow signal. Sinuses/Orbits: Negative. Other: None. IMPRESSION: 1. Large left PCA territory infarct predominantly involving the lateral left temporal lobe. No acute hemorrhage or mass effect. 2. Old bilateral cerebellar and left frontal infarcts. Electronically Signed   By: Ulyses Jarred M.D.   On: 02/26/2020 19:54    Medications:  I have reviewed the patient's current medications. Scheduled: . aspirin  81 mg Oral Daily  . atorvastatin  80 mg Oral q1800  . clopidogrel  75 mg Oral Daily  . insulin aspart  0-20 Units Subcutaneous TID WC  . insulin aspart  6 Units Subcutaneous TID WC  . levothyroxine  88 mcg Oral Daily  .  multivitamin with minerals  1 tablet Oral Daily     Assessment/Plan: 69 y.o. male with medical history significant forarthritis, gastric reflux, hypertension, obesity *(BMI 45),whopresents to the emergency department for word finding difficulty,headache and visual disturbance. Patient currently with only some mild word finding difficulty on examination.  MRI of the brain personally reviewed and shows an acute left PCA territory infarct.  Etiology likely embolic.  CTA shows no evidence of LVO or hemodynamically significant stenosis.  Echocardiogram shows no cardiac source of emboli with an EF of 60-65%.  A1c 6.2, LDL 62.  Plan: 1. Prophylactic therapy-Dual antiplatelet therapy with ASA 81mg  and Plavix 75mg  for three weeks with change to Plavix 75mg  daily alone as monotherapy after that time. 2. Continue statin 3. Telemetry monitoring 4. Frequent neuro checks 5. Patient scheduled for cardiac procedure in 2 weeks.  To follow up with his cardiologist after discharge for need of prolonged cardiac monitoring and need for delaying procedure.   6. No further neurologic intervention is recommended at this time.  If further questions arise, please call or page at that time.  Thank you for allowing neurology to participate in the care of this patient.   LOS: 1 day   Alexis Goodell, MD Neurology [phone] 02/27/2020  10:41 AM

## 2020-02-27 NOTE — Evaluation (Signed)
Occupational Therapy Evaluation Patient Details Name: Derrick Mckenzie MRN: [id number] DOB: 1951/07/18 Today's Date: 02/27/2020    History of Present Illness Derrick Mckenzie is a 26yoM who comes [facility] on 3/31 c acute onset anomia, HA, visual changes. Pt reports difficulty with Rt eye vision and wife reports some AMS as well. [place] then later MRI both revealing of Large left PCA territory infarct predominantly involving the lateral left temporal lobe. No acute hemorrhage or mass effect. PMH: arthritis, gastric reflux, hypertension, obesity. MRI also showing remote bilateral cerebellar and left frontal infarcts.   Clinical Impression   [name] was seen for OT evaluation this date. Pt lives in a 1 story home with a level entry, and spouse and able to provide assist as needed upon pt's return home. Pt was independent with mobility, ADL, and IADL prior to admission with no falls reported in past 12 months. Pt currently presents with R peripheral visual deficits, which can impact his functional mobility, safety, and ability to perform ADL and IADL tasks at [facility]. Pt educated on symptoms and compensatory strategies for management of visual field deficit including visual tracking, visual scanning exercises, safety awareness, falls prevention, navigating the environment, strategies for reading and community mobility. Pt verbalizes understanding of education provided, but would benefit from additional skilled OT services for review/opportunity to implement strategies into his daily routines. Pt at supervision level for mobility during assessment, good safety awareness, strength and ROM WNL, intact sensation and coordination, and no neglect to R side upon assessment. Baseline independence with ADL tasks. Upon hospital discharge recommend Outpatient OT.     Follow Up Recommendations  Outpatient OT    Equipment Recommendations  None recommended by OT    Recommendations for Other Services        Precautions / Restrictions Precautions Precautions: Fall Restrictions Weight Bearing Restrictions: No      Mobility Bed Mobility Overal bed mobility: Independent             General bed mobility comments: Deferred. Pt in recliner at start/end of session.  Transfers Overall transfer level: Independent Equipment used: None             General transfer comment: Pt performs fxl mobility in his room without AD/physical assist this date.    Balance Overall balance assessment: Independent                                         ADL either performed or assessed with clinical judgement   ADL Overall ADL's : At baseline                                       General ADL Comments: Pt presents at baseline level of functional independence. He Continues to experience communication difficulties R peripheral visual field deficits which limit his ability to perform IADL tasks and present potential challenges/safety risks during IADL tasks including driving, medication management, and functional communication (pt is having difficulty responding to text mesages to family) upon hospital discharge. During the sesion pt is able to perform standing grooming tasks at sink including with SBA for management of lines and leads this date. He is able to recognize and independently self-correct mistakes including adjusting placement of his toothpaste on the toothbrush this date.     Vision Baseline Vision/History:  Wears glasses Patient Visual Report: Peripheral vision impairment Vision Assessment?: Yes Eye Alignment: Within Functional Limits Ocular Range of Motion: Within Functional Limits Alignment/Gaze Preference: Within Defined Limits Tracking/Visual Pursuits: Able to track stimulus in all quads without difficulty Saccades: Within functional limits Visual Fields: Right visual field deficit(Evident R peripheral visual field cut.)     Perception     Praxis       Pertinent Vitals/Pain Pain Assessment: No/denies pain     Hand Dominance Right   Extremity/Trunk Assessment Upper Extremity Assessment Upper Extremity Assessment: Overall WFL for tasks assessed   Lower Extremity Assessment Lower Extremity Assessment: Overall WFL for tasks assessed   Cervical / Trunk Assessment Cervical / Trunk Assessment: Normal   Communication Communication Communication: Expressive difficulties   Cognition Arousal/Alertness: Awake/alert Behavior During Therapy: WFL for tasks assessed/performed Overall Cognitive Status: Within Functional Limits for tasks assessed                                 General Comments: Pt a&O x4.   General Comments       Exercises Other Exercises Other Exercises: Pt educated on falls prevention strategies for home and hospital, role of OT in acute care vs. out patient setting, as well as compensatory visual strategies for mgt of R peripheral field cut. Other Exercises: OT engages pt in fxl tasks including standing grooming tasks at sink. See ADL section for additional detail.   Shoulder Instructions      Home Living Family/patient expects to be discharged to:: Private residence Living Arrangements: Spouse/significant other Available Help at Discharge: Family Type of Home: House Home Access: Level entry     Home Layout: One level               Home Equipment: None          Prior Functioning/Environment Level of Independence: Independent                 OT Problem List: Decreased knowledge of use of DME or AE;Decreased safety awareness;Impaired vision/perception      OT Treatment/Interventions: Therapeutic exercise;Self-care/ADL training;Therapeutic activities;Visual/perceptual remediation/compensation;DME and/or AE instruction;Patient/family education    OT Goals(Current goals can be found in the care plan section) Acute Rehab OT Goals Patient Stated Goal: To get better OT Goal  Formulation: With patient Time For Goal Achievement: 03/12/20 Potential to Achieve Goals: Good ADL Goals Pt Will Perform Eating: with modified independence;with adaptive utensils(With adaptive equipment PRN) Pt Will Perform Grooming: with modified independence;standing;with adaptive equipment(With AE PRN) Additional ADL Goal #1: Pt will independently verbalize/implement at least 3 learned visual compensatory strategies into his daily ADL routine prior to discharge.  OT Frequency: Min 1X/week   Barriers to D/C:            Co-evaluation              AM-PAC OT "6 Clicks" Daily Activity     Outcome Measure Help from another person eating meals?: None Help from another person taking care of personal grooming?: None Help from another person toileting, which includes using toliet, bedpan, or urinal?: None Help from another person bathing (including washing, rinsing, drying)?: A Little Help from another person to put on and taking off regular upper body clothing?: None Help from another person to put on and taking off regular lower body clothing?: None 6 Click Score: 23   End of Session Equipment Utilized During Treatment: Gait belt  Activity  Tolerance: Patient tolerated treatment well Patient left: in chair;with call bell/phone within reach;with chair alarm set;with family/visitor present  OT Visit Diagnosis: Cognitive communication deficit (R41.841);Other symptoms and signs involving the nervous system (R29.898) Symptoms and signs involving cognitive functions: Cerebral infarction                Time: [id number] OT Time Calculation (min): 24 min Charges:  OT General Charges $OT Visit: 1 Visit OT Evaluation $OT Eval Moderate Complexity: 1 Mod OT Treatments $Self Care/Home Management : 8-22 mins  Shara Blazing, M.S., OTR/L Ascom: [phone] 02/27/20, 1:03 PM

## 2020-02-27 NOTE — Progress Notes (Signed)
*  PRELIMINARY RESULTS* Echocardiogram 2D Echocardiogram has been performed.  Sherrie Sport 02/27/2020, 8:54 AM

## 2020-02-28 LAB — GLUCOSE, CAPILLARY
Glucose-Capillary: 145 mg/dL — ABNORMAL HIGH (ref 70–99)
Glucose-Capillary: 194 mg/dL — ABNORMAL HIGH (ref 70–99)

## 2020-02-28 MED ORDER — ATORVASTATIN CALCIUM 40 MG PO TABS: 40.0000 mg | ORAL_TABLET | Freq: Every day | ORAL | 0 refills | Status: DC

## 2020-02-28 MED ORDER — CLOPIDOGREL BISULFATE 75 MG PO TABS: 75.0000 mg | ORAL_TABLET | Freq: Every day | ORAL | 0 refills | Status: AC

## 2020-02-28 MED ORDER — ASPIRIN 81 MG PO TBEC: 81.0000 mg | DELAYED_RELEASE_TABLET | Freq: Every day | ORAL | 0 refills | Status: AC

## 2020-02-28 NOTE — Discharge Summary (Signed)
Physician Discharge Summary  Derrick Mckenzie [id number] DOB: December 11, 1950 DOA: 02/26/2020  PCP: Sofie Hartigan, MD  Admit date: 02/26/2020 Discharge date: 02/28/2020  Admitted From: Home Disposition: Home   Recommendations for Outpatient Follow-up:  1. Follow up with cardiology in next 1-2 weeks for further recommendations regarding surgeries and repeat cardiac monitoring.  2. Continue DAPT x3 weeks, then plavix monotherapy thereafter.  3. Continue risk factor modification/optimization.  Home Health: Outpatient SLP, OT Equipment/Devices: None Discharge Condition: Stable CODE STATUS: Full Diet recommendation: Heart healthy, carb-modified  Brief/Interim Summary: Derrick Mckenzie is a 69 y.o. male with a history of obesity (BMI 67), CAD with planned CABG, aortic stenosis with planned AVR, HTN, OA, GERD who presented to the ED with word finding difficulty,confusion,headache and visual disturbance that began the previous evening. Patient had a CT scan of the head without contrast which showed loss of gray-white differentiation in the left parieto-occipital region suspicious for acute to subacute infarct. CTA showed no LVO. MRI confirms both acute large PCA territory stroke and remote bilateral cerebellar infarcts. Patient does report history of brief ataxia mid-2020 for which he did not seek immediate care. Neurology was consulted, recommended DAPT, statin. Echocardiogram confirmed severe aortic stenosis without WMA, preserved LVEF, and no cardioembolic source. Telemetry monitoring has revealed no arrhythmias during hospital stay. Fortunately, symptoms have improved and outpatient therapy is recommended.  Discharge Diagnoses:  Principal Problem:   Acute CVA (cerebrovascular accident) [facility]) Active Problems:   Hypertension   Morbid obesity ([place])   Hypothyroidism   Type 2 diabetes mellitus without complication ([place])  Acute left PCA infarct: Suspect embolic etiology per neurology. CTA  shows no evidence of LVO or hemodynamically significant stenosis.Echocardiogram shows no cardiac source of emboli with an EF of 60-65%. A1c 6.2, LDL62. - Consider cardiac monitoring as outpatient - DAPT x3 weeks, then plavix 75mg  monotherapy thereafter.  - Continue statin - Outpatient OT, SLP recommended.   CAD: With plan for CABGx3 per pt later this month.  - Will need to follow up with cardiology prior to procedure to discuss cardiac monitoring and timing of surgery.  - Continue antiplatelets and statin as above. Not on BB.  Severe aortic stenosis: Planning AVR  - Follow up with cardiology, CTS  HTN:  - Ok to restart home medications.  NIDT2DM: Last HbA1c of 6.2% suggests good glycemic control. - Restart metformin at discharge.   Morbid obesity: Estimated body mass index is 45.61 kg/m.  Hypothyroidism:  - Continue synthroid.   Discharge Instructions Discharge Instructions    Diet - low sodium heart healthy   Complete by: As directed    Discharge instructions   Complete by: As directed    You were admitted for a stroke affecting your speech/language. You will need to follow up with outpatient OT and speech therapy to continue recovery and follow up with cardiology as soon as possible for advice regarding need for cardiac monitoring (again) and timing of surgery.  - Take aspirin 81mg  daily for 3 weeks - Take plavix during those three weeks as well and continue this daily afterward.  - Take a slightly higher dose of lipitor, a new prescription for 40mg  daily was sent to your pharmacy.  - If your symptoms return or you notice new neurological symptoms, seek medical attention right away.   Increase activity slowly   Complete by: As directed      Allergies as of 02/28/2020   No Known Allergies     Medication List  TAKE these medications   albuterol 108 (90 Base) MCG/ACT inhaler Commonly known as: VENTOLIN HFA Inhale 2 puffs into the lungs every 6 (six) hours as  needed for wheezing or shortness of breath.   amLODipine 5 MG tablet Commonly known as: NORVASC Take 5 mg by mouth daily.   aspirin 81 MG EC tablet Take 1 tablet (81 mg total) by mouth daily for 21 days.   atorvastatin 40 MG tablet Commonly known as: LIPITOR Take 1 tablet (40 mg total) by mouth daily. What changed:   medication strength  how much to take   clopidogrel 75 MG tablet Commonly known as: PLAVIX Take 1 tablet (75 mg total) by mouth daily. Start taking on: February 29, 2020   Fluticasone-Salmeterol 100-50 MCG/DOSE Aepb Commonly known as: ADVAIR Inhale 1 puff into the lungs 2 (two) times daily.   furosemide 40 MG tablet Commonly known as: LASIX Take 40 mg by mouth daily.   levothyroxine 100 MCG tablet Commonly known as: SYNTHROID Take 100 mcg by mouth daily.   metFORMIN 500 MG 24 hr tablet Commonly known as: GLUCOPHAGE-XR Take 500 mg by mouth in the morning and at bedtime.   multivitamin with minerals Tabs tablet Take 1 tablet by mouth daily.   omega-3 acid ethyl esters 1 g capsule Commonly known as: LOVAZA Take 2 g by mouth daily.      Follow-up Information    Feldpausch, Chrissie Noa, MD. Schedule an appointment as soon as possible for a visit in 1 week(s).   Specialty: Family Medicine Contact information: [place] [address] [phone]        Teodoro Spray, MD. Schedule an appointment as soon as possible for a visit.   Specialty: Cardiology Contact information: [place] [address] [phone]          No Known Allergies  Consultations:  Neurology, Dr. Doy Mince  Procedures/Studies: CT Angio Head W or Wo Contrast  Result Date: 02/26/2020 CLINICAL DATA:  Stroke, follow-up EXAM: CT ANGIOGRAPHY HEAD AND NECK TECHNIQUE: Multidetector CT imaging of the head and neck was performed using the standard protocol during bolus administration of intravenous contrast. Multiplanar CT image reconstructions and  MIPs were obtained to evaluate the vascular anatomy. Carotid stenosis measurements (when applicable) are obtained utilizing NASCET criteria, using the distal internal carotid diameter as the denominator. CONTRAST:  96mL OMNIPAQUE IOHEXOL 350 MG/ML SOLN COMPARISON:  CT head earlier same day FINDINGS: CT HEAD FINDINGS Brain: Unchanged hypoattenuation and loss of gray differentiation in the left occipitotemporal region. There is no acute intracranial hemorrhage or new loss of gray-white differentiation. Small chronic infarct of the left precentral gyrus. Additional patchy hypoattenuation in the supratentorial white matter is nonspecific but likely reflects mild chronic microvascular ischemic changes. Ventricles are stable in size. Vascular: No new findings. Skull: No new findings. Sinuses: No new findings. Orbits: No new findings. Review of the MIP images confirms the above findings CTA NECK FINDINGS Aortic arch: Mild calcified plaque along the aortic arch. Patent great vessel origins. Right carotid system: Patent. Mild calcified and noncalcified plaque at the ICA origin without measurable stenosis. Left carotid system: Patent.  No measurable stenosis. Vertebral arteries: Patent. Right vertebral artery is dominant. There is a diminutive left vertebral artery. Skeleton: Degenerative changes of the cervical spine. Other neck: No mass or adenopathy. Upper chest: No apical lung mass. Review of the MIP images confirms the above findings CTA HEAD FINDINGS Anterior circulation: Intracranial internal carotid arteries are patent. Anterior  and middle cerebral arteries are patent. Anterior communicating artery is present. Posterior circulation: Intracranial vertebral arteries are patent. Basilar artery is patent. Posterior cerebral arteries are patent. There are bilateral posterior communicating arteries. Venous sinuses: As permitted by contrast timing, patent. Review of the MIP images confirms the above findings IMPRESSION:  Evolving left occipitotemporal infarction is unchanged in appearance. No acute intracranial hemorrhage. No large vessel occlusion or hemodynamically significant stenosis. Electronically Signed   By: Macy Mis M.D.   On: 02/26/2020 12:40   CT HEAD WO CONTRAST  Result Date: 02/26/2020 CLINICAL DATA:  Confusion EXAM: CT HEAD WITHOUT CONTRAST TECHNIQUE: Contiguous axial images were obtained from the base of the skull through the vertex without intravenous contrast. COMPARISON:  01/10/2020 FINDINGS: Brain: Loss of gray-white differentiation in the left parieto-occipital region (series 3, images 16-17). No intracranial hemorrhage or extra-axial collection. Normal ventricular size and configuration without hydrocephalus. Vascular: No hyperdense vessel or unexpected calcification. Skull: Normal. Negative for fracture or focal lesion. Sinuses/Orbits: No acute finding. Other: None. IMPRESSION: Loss of gray-white differentiation in the left parieto-occipital region suspicious for acute to subacute infarct. No hemorrhagic component identified. Further evaluation with MRI is recommended. These results were called by telephone at the time of interpretation on 02/26/2020 at 10:46 am to provider Kerri Perches, RN, who verbally acknowledged these results. Electronically Signed   By: Davina Poke D.O.   On: 02/26/2020 10:47   CT Angio Neck W and/or Wo Contrast  Result Date: 02/26/2020 CLINICAL DATA:  Stroke, follow-up EXAM: CT ANGIOGRAPHY HEAD AND NECK TECHNIQUE: Multidetector CT imaging of the head and neck was performed using the standard protocol during bolus administration of intravenous contrast. Multiplanar CT image reconstructions and MIPs were obtained to evaluate the vascular anatomy. Carotid stenosis measurements (when applicable) are obtained utilizing NASCET criteria, using the distal internal carotid diameter as the denominator. CONTRAST:  26mL OMNIPAQUE IOHEXOL 350 MG/ML SOLN COMPARISON:  CT head earlier same  day FINDINGS: CT HEAD FINDINGS Brain: Unchanged hypoattenuation and loss of gray differentiation in the left occipitotemporal region. There is no acute intracranial hemorrhage or new loss of gray-white differentiation. Small chronic infarct of the left precentral gyrus. Additional patchy hypoattenuation in the supratentorial white matter is nonspecific but likely reflects mild chronic microvascular ischemic changes. Ventricles are stable in size. Vascular: No new findings. Skull: No new findings. Sinuses: No new findings. Orbits: No new findings. Review of the MIP images confirms the above findings CTA NECK FINDINGS Aortic arch: Mild calcified plaque along the aortic arch. Patent great vessel origins. Right carotid system: Patent. Mild calcified and noncalcified plaque at the ICA origin without measurable stenosis. Left carotid system: Patent.  No measurable stenosis. Vertebral arteries: Patent. Right vertebral artery is dominant. There is a diminutive left vertebral artery. Skeleton: Degenerative changes of the cervical spine. Other neck: No mass or adenopathy. Upper chest: No apical lung mass. Review of the MIP images confirms the above findings CTA HEAD FINDINGS Anterior circulation: Intracranial internal carotid arteries are patent. Anterior and middle cerebral arteries are patent. Anterior communicating artery is present. Posterior circulation: Intracranial vertebral arteries are patent. Basilar artery is patent. Posterior cerebral arteries are patent. There are bilateral posterior communicating arteries. Venous sinuses: As permitted by contrast timing, patent. Review of the MIP images confirms the above findings IMPRESSION: Evolving left occipitotemporal infarction is unchanged in appearance. No acute intracranial hemorrhage. No large vessel occlusion or hemodynamically significant stenosis. Electronically Signed   By: Macy Mis M.D.   On: 02/26/2020 12:40  MR BRAIN WO CONTRAST  Result Date:  02/26/2020 CLINICAL DATA:  Word-finding difficulty and vision changes. EXAM: MRI HEAD WITHOUT CONTRAST TECHNIQUE: Multiplanar, multiecho pulse sequences of the brain and surrounding structures were obtained without intravenous contrast. COMPARISON:  Head CT and CTA head neck 02/26/2020 FINDINGS: Brain: Large left PCA territory infarct predominantly involving the lateral left temporal lobe. No other site of diffusion abnormality. There are old bilateral cerebellar small vessel infarcts. Small, old left frontal infarct along the precentral gyrus. Multifocal white matter hyperintensity, most commonly due to chronic ischemic microangiopathy. Normal volume of CSF spaces. No chronic microhemorrhage. Normal midline structures. Vascular: Normal flow voids. Skull and upper cervical spine: Normal marrow signal. Sinuses/Orbits: Negative. Other: None. IMPRESSION: 1. Large left PCA territory infarct predominantly involving the lateral left temporal lobe. No acute hemorrhage or mass effect. 2. Old bilateral cerebellar and left frontal infarcts. Electronically Signed   By: Ulyses Jarred M.D.   On: 02/26/2020 19:54   ECHOCARDIOGRAM COMPLETE  Result Date: 02/27/2020    ECHOCARDIOGRAM REPORT   Patient Name:   Derrick Mckenzie Date of Exam: 02/27/2020 Medical Rec #:  [id number]         Height:       68.0 in Accession #:    [id number]        Weight:       300.0 lb Date of Birth:  08-Apr-1951         BSA:          2.428 m Patient Age:    70 years          BP:           120/71 mmHg Patient Gender: M                 HR:           57 bpm. Exam Location:  [facility] Procedure: 2D Echo, Cardiac Doppler and Color Doppler Indications:     Stroke 434.91  History:         Patient has no prior history of Echocardiogram examinations.                  Signs/Symptoms:Murmur; Risk Factors:Hypertension and Sleep                  Apnea.  Sonographer:     Sherrie Sport RDCS (AE) Referring Phys:  [id number] Royce Macadamia AGBATA Diagnosing Phys: Bartholome Bill MD IMPRESSIONS   1. Left ventricular ejection fraction, by estimation, is 60 to 65%. The left ventricle has normal function. The left ventricle has no regional wall motion abnormalities. Left ventricular diastolic parameters are consistent with Grade I diastolic dysfunction (impaired relaxation).  2. Right ventricular systolic function is normal. The right ventricular size is mildly enlarged. There is normal pulmonary artery systolic pressure.  3. Left atrial size was mildly dilated.  4. The mitral valve was not well visualized. Mild mitral valve regurgitation.  5. Tricuspid valve regurgitation is mild to moderate.  6. The aortic valve was not well visualized. Aortic valve regurgitation is mild. Severe aortic valve stenosis.  7. Aortic dilatation noted. FINDINGS  Left Ventricle: Left ventricular ejection fraction, by estimation, is 60 to 65%. The left ventricle has normal function. The left ventricle has no regional wall motion abnormalities. The left ventricular internal cavity size was normal in size. There is  borderline left ventricular hypertrophy. Left ventricular diastolic parameters are consistent with Grade I diastolic dysfunction (impaired relaxation). Right Ventricle: The right ventricular size  is mildly enlarged. No increase in right ventricular wall thickness. Right ventricular systolic function is normal. There is normal pulmonary artery systolic pressure. The tricuspid regurgitant velocity is 1.64  m/s, and with an assumed right atrial pressure of 10 mmHg, the estimated right ventricular systolic pressure is [id number] mmHg. Left Atrium: Left atrial size was mildly dilated. Right Atrium: Right atrial size was normal in size. Pericardium: There is no evidence of pericardial effusion. Mitral Valve: The mitral valve was not well visualized. Mild mitral valve regurgitation. Tricuspid Valve: The tricuspid valve is not well visualized. Tricuspid valve regurgitation is mild to moderate. Aortic Valve: The aortic valve was not well  visualized. . There is severe thickening and severe calcifcation of the aortic valve. Aortic valve regurgitation is mild. Severe aortic stenosis is present. There is severe thickening of the aortic valve. There  is severe calcifcation of the aortic valve. Aortic valve mean gradient measures 41.4 mmHg. Aortic valve peak gradient measures 44.5 mmHg. Aortic valve area, by VTI measures 0.82 cm. Pulmonic Valve: The pulmonic valve was not well visualized. Pulmonic valve regurgitation is trivial. Aorta: Aortic dilatation noted. IAS/Shunts: The interatrial septum was not assessed.  LEFT VENTRICLE PLAX 2D LVIDd:         5.03 cm  Diastology LVIDs:         2.83 cm  LV e' lateral:   4.68 cm/s LV PW:         1.20 cm  LV E/e' lateral: 15.3 LV IVS:        1.17 cm  LV e' medial:    5.66 cm/s LVOT diam:     2.00 cm  LV E/e' medial:  12.7 LV SV:         64 LV SV Index:   27 LVOT Area:     3.14 cm  RIGHT VENTRICLE RV Basal diam:  4.74 cm RV S prime:     11.90 cm/s TAPSE (M-mode): 4.6 cm LEFT ATRIUM             Index       RIGHT ATRIUM           Index LA diam:        4.00 cm 1.65 cm/m  RA Area:     23.20 cm LA Vol (A2C):   91.9 ml 37.85 ml/m RA Volume:   78.50 ml  32.33 ml/m LA Vol (A4C):   63.7 ml 26.23 ml/m LA Biplane Vol: 77.5 ml 31.92 ml/m  AORTIC VALVE                    PULMONIC VALVE AV Area (Vmax):    0.79 cm     PV Vmax:        0.88 m/s AV Area (Vmean):   0.69 cm     PV Peak grad:   3.1 mmHg AV Area (VTI):     0.82 cm     RVOT Peak grad: 4 mmHg AV Vmax:           333.72 cm/s AV Vmean:          268.320 cm/s AV VTI:            0.781 m AV Peak Grad:      44.5 mmHg AV Mean Grad:      41.4 mmHg LVOT Vmax:         84.20 cm/s LVOT Vmean:        58.700 cm/s LVOT VTI:  0.205 m LVOT/AV VTI ratio: 0.26  AORTA Ao Root diam: 3.10 cm MITRAL VALVE                TRICUSPID VALVE MV Area (PHT): 2.66 cm     TR Peak grad:   10.8 mmHg MV Decel Time: 285 msec     TR Vmax:        164.00 cm/s MV E velocity: 71.80 cm/s MV A  velocity: 106.00 cm/s  SHUNTS MV E/A ratio:  0.68         Systemic VTI:  0.20 m                             Systemic Diam: 2.00 cm Bartholome Bill MD Electronically signed by Bartholome Bill MD Signature Date/Time: 02/27/2020/12:20:21 PM    Final    Subjective: Speech is clearing up and word blocking less frequently. Able to read texts on phone today. No new numbness or weakness. No chest pain or shortness of breath reported.  Discharge Exam: Vitals:   02/28/20 0002 02/28/20 0730  BP: (!) 124/56 131/83  Pulse: (!) 56 69  Resp: 16 18  Temp: 97.6 F (36.4 C) 98.2 F (36.8 C)  SpO2: 96% 98%   General: Pt is alert, awake, not in acute distress Cardiovascular: RRR, S1/S2 +, no rubs, no gallops Respiratory: CTA bilaterally, no wheezing, no rhonchi Abdominal: Soft, NT, ND, bowel sounds + Extremities: No edema, no cyanosis Neuro: Alert, oriented, no focal sensory or motor deficits noted. Minimal dysarthria and some word blocking, receptive function normal.  Labs: Basic Metabolic Panel: Recent Labs  Lab 02/26/20 1053  NA 139  K 3.9  CL 101  CO2 27  GLUCOSE 171*  BUN 28*  CREATININE 1.11  CALCIUM 9.6   Liver Function Tests: Recent Labs  Lab 02/26/20 1053  AST 20  ALT 17  ALKPHOS 51  BILITOT 1.1  PROT 7.7  ALBUMIN 4.2   No results for input(s): LIPASE, AMYLASE in the last 168 hours. No results for input(s): AMMONIA in the last 168 hours. CBC: Recent Labs  Lab 02/26/20 1053  WBC 7.8  NEUTROABS 5.5  HGB 14.3  HCT 42.6  MCV 90.4  PLT 197   Cardiac Enzymes: No results for input(s): CKTOTAL, CKMB, CKMBINDEX, TROPONINI in the last 168 hours. BNP: Invalid input(s): POCBNP CBG: Recent Labs  Lab 02/27/20 0748 02/27/20 1114 02/27/20 1610 02/27/20 2212 02/28/20 0732  GLUCAP 145* 140* 140* 118* 145*   D-Dimer No results for input(s): DDIMER in the last 72 hours. Hgb A1c Recent Labs    02/26/20 1053 02/27/20 0500  HGBA1C 6.3* 6.2*   Lipid Profile Recent Labs     02/27/20 0500  CHOL 137  HDL 28*  LDLCALC 62  TRIG 237*  CHOLHDL 4.9   Thyroid function studies No results for input(s): TSH, T4TOTAL, T3FREE, THYROIDAB in the last 72 hours.  Invalid input(s): FREET3 Anemia work up No results for input(s): VITAMINB12, FOLATE, FERRITIN, TIBC, IRON, RETICCTPCT in the last 72 hours. Urinalysis    Component Value Date/Time   COLORURINE YELLOW (A) 06/08/2018 1949   APPEARANCEUR CLEAR (A) 06/08/2018 1949   LABSPEC 1.027 06/08/2018 1949   PHURINE 5.0 06/08/2018 1949   GLUCOSEU NEGATIVE 06/08/2018 1949   HGBUR NEGATIVE 06/08/2018 1949   BILIRUBINUR NEGATIVE 06/08/2018 1949   KETONESUR NEGATIVE 06/08/2018 1949   PROTEINUR NEGATIVE 06/08/2018 1949   NITRITE NEGATIVE 06/08/2018 1949   LEUKOCYTESUR NEGATIVE 06/08/2018  1949    Microbiology Recent Results (from the past 240 hour(s))  SARS CORONAVIRUS 2 (TAT 6-24 HRS) Nasopharyngeal Nasopharyngeal Swab     Status: None   Collection Time: 02/26/20 11:32 AM   Specimen: Nasopharyngeal Swab  Result Value Ref Range Status   SARS Coronavirus 2 NEGATIVE NEGATIVE Final    Comment: (NOTE) SARS-CoV-2 target nucleic acids are NOT DETECTED. The SARS-CoV-2 RNA is generally detectable in upper and lower respiratory specimens during the acute phase of infection. Negative results do not preclude SARS-CoV-2 infection, do not rule out co-infections with other pathogens, and should not be used as the sole basis for treatment or other patient management decisions. Negative results must be combined with clinical observations, patient history, and epidemiological information. The expected result is Negative. Fact Sheet for Patients: SugarRoll.be Fact Sheet for Healthcare Providers: https://www.woods-mathews.com/ This test is not yet approved or cleared by the [place] FDA and  has been authorized for detection and/or diagnosis of SARS-CoV-2 by FDA under an Emergency Use  Authorization (EUA). This EUA will remain  in effect (meaning this test can be used) for the duration of the COVID-19 declaration under Section 56 4(b)(1) of the Act, 21 U.S.C. section 360bbb-3(b)(1), unless the authorization is terminated or revoked sooner. Performed at [facility], [place] [address] [place], [place] [address]     Time coordinating discharge: Approximately 40 minutes  Patrecia Pour, MD  [facility] 02/28/2020, 10:53 AM

## 2020-02-28 NOTE — Progress Notes (Signed)
IV removed before discharge. Went over discharge instructions and medications with patient and his wife, they both stated that they understood and did not have any questions.

## 2020-03-06 DIAGNOSIS — Z87891 Personal history of nicotine dependence: Secondary | ICD-10-CM | POA: Diagnosis not present

## 2020-03-06 DIAGNOSIS — I10 Essential (primary) hypertension: Secondary | ICD-10-CM | POA: Diagnosis not present

## 2020-03-06 DIAGNOSIS — E119 Type 2 diabetes mellitus without complications: Secondary | ICD-10-CM | POA: Diagnosis not present

## 2020-03-06 DIAGNOSIS — I639 Cerebral infarction, unspecified: Secondary | ICD-10-CM | POA: Diagnosis not present

## 2020-03-06 DIAGNOSIS — Z8673 Personal history of transient ischemic attack (TIA), and cerebral infarction without residual deficits: Secondary | ICD-10-CM | POA: Diagnosis not present

## 2020-03-06 DIAGNOSIS — Z79899 Other long term (current) drug therapy: Secondary | ICD-10-CM | POA: Diagnosis not present

## 2020-03-10 DIAGNOSIS — I2583 Coronary atherosclerosis due to lipid rich plaque: Secondary | ICD-10-CM | POA: Insufficient documentation

## 2020-03-10 DIAGNOSIS — I5032 Chronic diastolic (congestive) heart failure: Secondary | ICD-10-CM | POA: Insufficient documentation

## 2020-03-10 DIAGNOSIS — I2510 Atherosclerotic heart disease of native coronary artery without angina pectoris: Secondary | ICD-10-CM | POA: Insufficient documentation

## 2020-03-11 ENCOUNTER — Ambulatory Visit: Payer: Medicare HMO | Attending: Gerontology | Admitting: Speech Pathology

## 2020-03-11 ENCOUNTER — Other Ambulatory Visit: Payer: Self-pay

## 2020-03-11 ENCOUNTER — Encounter: Payer: Self-pay | Admitting: Speech Pathology

## 2020-03-11 DIAGNOSIS — R4701 Aphasia: Secondary | ICD-10-CM | POA: Diagnosis not present

## 2020-03-11 NOTE — Therapy (Signed)
[place] MAIN [facility] SERVICES [address] [place], [place], [address] Phone: [phone]   Fax:  [phone]  Speech Language Pathology Treatment  Patient Details  Name: Derrick Mckenzie MRN: [id number] Date of Birth: [birth year] Referring Provider (SLP): Toni Arthurs   Encounter Date: 03/11/2020  End of Session - 03/11/20 1337    Visit Number  1    Number of Visits  17    Date for SLP Re-Evaluation  05/06/20    Authorization - Visit Number  1    Authorization - Number of Visits  10    SLP Start Time  0900    SLP Stop Time   0950    SLP Time Calculation (min)  50 min    Activity Tolerance  Patient tolerated treatment well       Past Medical History:  Diagnosis Date  . Arthritis    knees  . Complication of anesthesia    aggitation after knee surgery  . Family history of adverse reaction to anesthesia    brother - hallucinations  . GERD (gastroesophageal reflux disease)   . Hx of smoking 06/26/2015  . Hyperlipidemia   . Hypertension   . Hypothyroidism   . Kidney stone   . Morbid obesity ([place])   . Murmur   . Prediabetes 10/14/2016  . Sleep apnea    score of 5 on apnea screen    Past Surgical History:  Procedure Laterality Date  . ESOPHAGOGASTRODUODENOSCOPY (EGD) WITH PROPOFOL N/A 10/05/2016   Procedure: ESOPHAGOGASTRODUODENOSCOPY (EGD) WITH PROPOFOL;  Surgeon: Jonathon Bellows, MD;  Location: [place];  Service: Endoscopy;  Laterality: N/A;  sleep apnea  . HERNIA REPAIR    . REPLACEMENT TOTAL KNEE Right 2017  . TOTAL KNEE ARTHROPLASTY  1973    There were no vitals filed for this visit.  Subjective Assessment - 03/11/20 1336    Subjective  Patient reports occassional feelings of frustration/anger following stroke and states this is unusual behavior.        SLP Evaluation [facility] - 03/11/20 0001      SLP Visit Information   SLP Received On  03/11/20    Referring Provider (SLP)  Toni Arthurs    Onset Date   02/26/2020    Medical Diagnosis  Acute CVA      Subjective   Subjective  Patient was humorous and in good spirits.      General Information   HPI  Derrick Mckenzie is a 69 year old male who was admitted to [facility] on 3/31 with acute onset word finding difficulties, HA, visual changes. "MRI revealed a Large left PCA territory infarct predominantly involving the lateral left temporal lobe. No acute hemorrhage or mass effect." MRI also showed remote bilateral cerebellar and left frontal infarcts.  The patient's medical history is significant for arthritis, gastric reflux, hypertension, and obesity.      Prior Functional Status   Cognitive/Linguistic Baseline  Within functional limits      Auditory Comprehension   Overall Auditory Comprehension  Impaired    Yes/No Questions  Impaired   with complex questions   Commands  Impaired    Complex Commands  25-49% accurate    Conversation  Moderately complex      Reading Comprehension   Reading Status  Within funtional limits   Patient stated he was guessing/had "no idea what's going on" but answered all questions correctly.     Expression   Primary Mode of Expression  Verbal  Verbal Expression   Overall Verbal Expression  Impaired    Initiation  Impaired    Repetition  No impairment    Naming  Impairment    Responsive  76-100% accurate    Confrontation  75-100% accurate    Other Naming Comments  semantic paraphasias noted, delayed initiation    Pragmatics  No impairment      Written Expression   Written Expression  Within Functional Limits    Overall Writen Expression  Some minor spelling/grammatical errors noted      Oral Motor/Sensory Function   Overall Oral Motor/Sensory Function  Appears within functional limits for tasks assessed      Motor Speech   Overall Motor Speech  Appears within functional limits for tasks assessed      Standardized Assessments   Standardized Assessments   Western Aphasia Battery revised        Western Aphasia Battery- Revised  Spontaneous Speech                           Information content               9/10                                            Fluency                                 8/10                                          Comprehension     Yes/No questions                 57/60                                           Auditory Word Recognition  59/60                                  Sequential Commands       60/80                              Repetition                              92/100                                        Naming    Object Naming                     57/60  Word Fluency          16/20                                            Sentence Completion          10/10                                             Responsive Speech              10/10                                         Aphasia Quotient                  88.6/100       ADULT SLP TREATMENT - 03/11/20 0001      General Information   Behavior/Cognition  --       SLP Education - 03/11/20 1337    Education Details  The role of the SLP in treatment of aphasia    Person(s) Educated  Patient    Methods  Explanation    Comprehension  Verbalized understanding         SLP Long Term Goals - 03/11/20 1349      SLP LONG TERM GOAL #1   Title  Patient will generate grammatical and cogent phrase/sentence to complete simple/concrete linguistic task with 80% accuracy.    Time  8    Period  Weeks    Status  New    Target Date  05/06/20      SLP LONG TERM GOAL #2   Title  Patient will demonstrate reading comprehension for short paragraphs with 80% accuracy.    Time  8    Period  Weeks    Status  New    Target Date  05/06/20      SLP LONG TERM GOAL #3   Title  Patient will complete 3 unit processing tasks with 80% accuracy without the need of repetition of task instructions or significant delays in responding.    Time  8    Period  Weeks     Status  New    Target Date  05/06/20      SLP LONG TERM GOAL #4   Title  Patient will participate in complex conversation at 80% accuracy given mod clinician cues.    Time  8    Period  Weeks    Status  New    Target Date  05/06/20       Plan - 03/11/20 1340    Clinical Impression Statement  This 69 year old man, with a medical history of of an acute CVA on 02/26/20, presents with mild aphasia characterized by word-finding difficulty, impaired grammar, mild auditory verbal comprehenison deficits and some reading comprehension difficulties. The patient's expressive and receptive language skills are functional but the patient would benefit from speech-language therapy. His spontaneous speech is characterized by semantic paraphasias, word-finding difficulty and occasional poor grammar.The patient states his wife has noticed changes in his speech and he hopes to regain some language skills post-CVA. The patient will be undergoing intensive cardiac surgery  in the near future, but expressed a desire to attend speech therapy for as long as possible until surgical intervention.    Speech Therapy Frequency  2x / week    Duration  Other (comment)   8 weeks   Treatment/Interventions  Compensatory techniques;Compensatory strategies;SLP instruction and feedback;Language facilitation;Cueing hierarchy    Potential Considerations  Co-morbidities;Medical prognosis;Ability to learn/carryover information       Patient will benefit from skilled therapeutic intervention in order to improve the following deficits and impairments:   Aphasia    Problem List Patient Active Problem List   Diagnosis Date Noted  . Acute CVA (cerebrovascular accident) ([place]) 02/26/2020  . Type 2 diabetes mellitus without complication ([place]) XX123456  . Iliac artery aneurysm ([place]) 05/07/2019  . Prostate cancer screening 03/16/2017  . Full code status 03/16/2017  . Preventative health care 03/16/2017  . Need for hepatitis C  screening test 10/14/2016  . Prediabetes 10/14/2016  . Peptic ulcer of stomach   . Calculus of gallbladder without cholecystitis without obstruction 09/27/2016  . Right nephrolithiasis 09/27/2016  . Status post right knee replacement 09/08/2016  . Medication monitoring encounter 10/30/2015  . Sleep pattern disturbance 06/26/2015  . Hx of smoking 06/26/2015  . Need for prophylactic vaccination against Streptococcus pneumoniae (pneumococcus) 06/26/2015  . Hypertension   . Hyperlipidemia   . Morbid obesity ([place])   . Hypothyroidism   . Degenerative arthritis of knee, bilateral 06/12/2015  . Chest pain, atypical 02/04/2015    Kerah Hardebeck 03/11/2020, 3:30 PM  [place] MAIN [facility] SERVICES [address] [place], [place], [address] Phone: [phone]   Fax:  [phone]   Name: Derrick Mckenzie MRN: [id number] Date of Birth: March 11, 1951

## 2020-03-16 DIAGNOSIS — I639 Cerebral infarction, unspecified: Secondary | ICD-10-CM | POA: Diagnosis not present

## 2020-03-16 DIAGNOSIS — I10 Essential (primary) hypertension: Secondary | ICD-10-CM | POA: Diagnosis not present

## 2020-03-16 DIAGNOSIS — I2510 Atherosclerotic heart disease of native coronary artery without angina pectoris: Secondary | ICD-10-CM | POA: Diagnosis not present

## 2020-03-16 DIAGNOSIS — I5032 Chronic diastolic (congestive) heart failure: Secondary | ICD-10-CM | POA: Diagnosis not present

## 2020-03-16 DIAGNOSIS — I2583 Coronary atherosclerosis due to lipid rich plaque: Secondary | ICD-10-CM | POA: Diagnosis not present

## 2020-03-16 DIAGNOSIS — I350 Nonrheumatic aortic (valve) stenosis: Secondary | ICD-10-CM | POA: Diagnosis not present

## 2020-03-16 DIAGNOSIS — E782 Mixed hyperlipidemia: Secondary | ICD-10-CM | POA: Diagnosis not present

## 2020-03-17 ENCOUNTER — Encounter: Payer: Self-pay | Admitting: Speech Pathology

## 2020-03-17 ENCOUNTER — Other Ambulatory Visit: Payer: Self-pay

## 2020-03-17 ENCOUNTER — Ambulatory Visit: Payer: Medicare HMO | Admitting: Speech Pathology

## 2020-03-17 DIAGNOSIS — R4701 Aphasia: Secondary | ICD-10-CM

## 2020-03-17 NOTE — Therapy (Signed)
[place] MAIN [facility] SERVICES [address] [place], [place], [address] Phone: [phone]   Fax:  [phone]  Speech Language Pathology Treatment  Patient Details  Name: Derrick Mckenzie MRN: [id number] Date of Birth: Nov 16, 1951 Referring Provider (SLP): Toni Arthurs   Encounter Date: 03/17/2020  End of Session - 03/17/20 1612    Visit Number  2    Number of Visits  17    Date for SLP Re-Evaluation  05/06/20    Authorization - Visit Number  2    Authorization - Number of Visits  10    SLP Start Time  1300    SLP Stop Time   1350    SLP Time Calculation (min)  50 min    Activity Tolerance  Patient tolerated treatment well       Past Medical History:  Diagnosis Date  . Arthritis    knees  . Complication of anesthesia    aggitation after knee surgery  . Family history of adverse reaction to anesthesia    brother - hallucinations  . GERD (gastroesophageal reflux disease)   . Hx of smoking 06/26/2015  . Hyperlipidemia   . Hypertension   . Hypothyroidism   . Kidney stone   . Morbid obesity ([place])   . Murmur   . Prediabetes 10/14/2016  . Sleep apnea    score of 5 on apnea screen    Past Surgical History:  Procedure Laterality Date  . ESOPHAGOGASTRODUODENOSCOPY (EGD) WITH PROPOFOL N/A 10/05/2016   Procedure: ESOPHAGOGASTRODUODENOSCOPY (EGD) WITH PROPOFOL;  Surgeon: Jonathon Bellows, MD;  Location: [place];  Service: Endoscopy;  Laterality: N/A;  sleep apnea  . HERNIA REPAIR    . REPLACEMENT TOTAL KNEE Right 2017  . TOTAL KNEE ARTHROPLASTY  1973    There were no vitals filed for this visit.  Subjective Assessment - 03/17/20 1611    Subjective  Patient noted to become frustrated on tasks.            ADULT SLP TREATMENT - 03/17/20 0001      General Information   Behavior/Cognition  Alert;Cooperative;Pleasant mood      Treatment Provided   Treatment provided  Cognitive-Linquistic      Pain Assessment   Pain Assessment  No/denies pain      Cognitive-Linquistic Treatment   Treatment focused on  Aphasia    Skilled Treatment  Patient and his wife were educated on effective communication partner training strategies. The patient described 5 words in a barrier task utilizing recently learned strategies and techniques and his wife understood all 5 terms. The patient's wife described 5 terms in a barrier task and the patient was 100% accurate at correctly infering the correct/target word.      Assessment / Recommendations / Plan   Plan  Continue with current plan of care      Progression Toward Goals   Progression toward goals  Progressing toward goals       SLP Education - 03/17/20 1611    Education Details  communication partner training, word-finding strategies.    Person(s) Educated  Patient;Spouse    Methods  Explanation;Handout    Comprehension  Verbalized understanding         SLP Long Term Goals - 03/11/20 1349      SLP LONG TERM GOAL #1   Title  Patient will generate grammatical and cogent phrase/sentence to complete simple/concrete linguistic task with 80% accuracy.    Time  8  Period  Weeks    Status  New    Target Date  05/06/20      SLP LONG TERM GOAL #2   Title  Patient will demonstrate reading comprehension for short paragraphs with 80% accuracy.    Time  8    Period  Weeks    Status  New    Target Date  05/06/20      SLP LONG TERM GOAL #3   Title  Patient will complete 3 unit processing tasks with 80% accuracy without the need of repetition of task instructions or significant delays in responding.    Time  8    Period  Weeks    Status  New    Target Date  05/06/20      SLP LONG TERM GOAL #4   Title  The patietn will participate in complex conversation at 80% accuracy given mod clinician cues.    Time  8    Period  Weeks    Status  New    Target Date  05/06/20       Plan - 03/17/20 1612    Clinical Impression Statement  This 69 year old man, with a  medical history of of an acute CVA on 02/26/20, presents with mild aphasia characterized by mild word-finding difficulty, impaired grammar, mild auditory verbal comprehenison deficits. The patient's expressive and receptive language skills are functional but the patient would benefit from speech-language therapy intervention to improve word-finding and grammatical ouput. The patient will be undergoing intensive cardiac surgery in the near future, but expressed a desire to attend speech therapy until surgical intervention.    Speech Therapy Frequency  2x / week    Duration  Other (comment)   8 weeks   Treatment/Interventions  Compensatory techniques;Compensatory strategies;SLP instruction and feedback;Language facilitation;Cueing hierarchy    Potential Considerations  Co-morbidities;Medical prognosis;Ability to learn/carryover information       Patient will benefit from skilled therapeutic intervention in order to improve the following deficits and impairments:   Aphasia    Problem List Patient Active Problem List   Diagnosis Date Noted  . Acute CVA (cerebrovascular accident) ([place]) 02/26/2020  . Type 2 diabetes mellitus without complication ([place]) [id number]  . Iliac artery aneurysm ([place]) 05/07/2019  . Prostate cancer screening 03/16/2017  . Full code status 03/16/2017  . Preventative health care 03/16/2017  . Need for hepatitis C screening test 10/14/2016  . Prediabetes 10/14/2016  . Peptic ulcer of stomach   . Calculus of gallbladder without cholecystitis without obstruction 09/27/2016  . Right nephrolithiasis 09/27/2016  . Status post right knee replacement 09/08/2016  . Medication monitoring encounter 10/30/2015  . Sleep pattern disturbance 06/26/2015  . Hx of smoking 06/26/2015  . Need for prophylactic vaccination against Streptococcus pneumoniae (pneumococcus) 06/26/2015  . Hypertension   . Hyperlipidemia   . Morbid obesity ([place])   . Hypothyroidism   . Degenerative arthritis  of knee, bilateral 06/12/2015  . Chest pain, atypical 02/04/2015    Ernesto Zukowski 03/17/2020, 4:13 PM  [place] MAIN [facility] SERVICES [address] [place], [place], [address] Phone: [phone]   Fax:  [phone]   Name: Derrick Mckenzie MRN: [id number] Date of Birth: [birth year]

## 2020-03-19 ENCOUNTER — Ambulatory Visit: Payer: Medicare HMO | Admitting: Speech Pathology

## 2020-03-19 ENCOUNTER — Other Ambulatory Visit: Payer: Self-pay

## 2020-03-19 ENCOUNTER — Encounter: Payer: Self-pay | Admitting: Speech Pathology

## 2020-03-19 DIAGNOSIS — R4701 Aphasia: Secondary | ICD-10-CM

## 2020-03-19 NOTE — Therapy (Signed)
[place] MAIN [facility] SERVICES [address] [place], [place], [address] Phone: [phone]   Fax:  [phone]  Speech Language Pathology Treatment  Patient Details  Name: Derrick Mckenzie MRN: [id number] Date of Birth: 1951-02-03 Referring Provider (SLP): Toni Arthurs   Encounter Date: 03/19/2020  End of Session - 03/19/20 1525    Visit Number  3    Number of Visits  17    Date for SLP Re-Evaluation  05/06/20    Authorization - Visit Number  3    Authorization - Number of Visits  10    SLP Start Time  1300    SLP Stop Time   1350    SLP Time Calculation (min)  50 min    Activity Tolerance  Patient tolerated treatment well       Past Medical History:  Diagnosis Date  . Arthritis    knees  . Complication of anesthesia    aggitation after knee surgery  . Family history of adverse reaction to anesthesia    brother - hallucinations  . GERD (gastroesophageal reflux disease)   . Hx of smoking 06/26/2015  . Hyperlipidemia   . Hypertension   . Hypothyroidism   . Kidney stone   . Morbid obesity ([place])   . Murmur   . Prediabetes 10/14/2016  . Sleep apnea    score of 5 on apnea screen    Past Surgical History:  Procedure Laterality Date  . ESOPHAGOGASTRODUODENOSCOPY (EGD) WITH PROPOFOL N/A 10/05/2016   Procedure: ESOPHAGOGASTRODUODENOSCOPY (EGD) WITH PROPOFOL;  Surgeon: Jonathon Bellows, MD;  Location: [place];  Service: Endoscopy;  Laterality: N/A;  sleep apnea  . HERNIA REPAIR    . REPLACEMENT TOTAL KNEE Right 2017  . TOTAL KNEE ARTHROPLASTY  1973    There were no vitals filed for this visit.  Subjective Assessment - 03/19/20 1524    Subjective  Patient's wife continues to report angry outbursts/frustration.           ADULT SLP TREATMENT - 03/19/20 0001      General Information   Behavior/Cognition  Alert;Cooperative;Pleasant mood      Treatment Provided   Treatment provided  Cognitive-Linquistic      Pain  Assessment   Pain Assessment  No/denies pain      Cognitive-Linquistic Treatment   Treatment focused on  Aphasia    Skilled Treatment  Patient and his wife were educated on effective communication partner training strategies. The patient and his wife were also educated on causes, treatment and management of anger post-onset of a CVA. Patient expressed a desire to improve auditory comprehension and was able to identify strategies that were useful to him in listening to/summarizing paragraph level information delivered auditorily. He rephrased a paragraph delivered auditorily with 90% accuracy. He correctly named words given a definition with 100% accuracy.     Assessment / Recommendations / Plan   Plan  Continue with current plan of care      Progression Toward Goals   Progression toward goals  Progressing toward goals       SLP Education - 03/19/20 1524    Education Details  communication partner training, word-finding strategies    Person(s) Educated  Patient;Spouse    Methods  Explanation;Handout    Comprehension  Verbalized understanding         SLP Long Term Goals - 03/11/20 1349      SLP LONG TERM GOAL #1   Title  Patient will generate grammatical  and cogent phrase/sentence to complete simple/concrete linguistic task with 80% accuracy.    Time  8    Period  Weeks    Status  New    Target Date  05/06/20      SLP LONG TERM GOAL #2   Title  Patient will demonstrate reading comprehension for short paragraphs with 80% accuracy.    Time  8    Period  Weeks    Status  New    Target Date  05/06/20      SLP LONG TERM GOAL #3   Title  Patient will complete 3 unit processing tasks with 80% accuracy without the need of repetition of task instructions or significant delays in responding.    Time  8    Period  Weeks    Status  New    Target Date  05/06/20      SLP LONG TERM GOAL #4   Title  The patietn will participate in complex conversation at 80% accuracy given mod  clinician cues.    Time  8    Period  Weeks    Status  New    Target Date  05/06/20       Plan - 03/19/20 1525    Clinical Impression Statement  Patient has decreased insight of symptoms that his wife reports. The patient continues to have deficits in expressive and receptive language, and his wife reports continued angry outburtsts. The patient denies any feelings of anger and reports he is feeling better.    Speech Therapy Frequency  2x / week    Duration  Other (comment)   8 weeks   Treatment/Interventions  Compensatory techniques;Compensatory strategies;SLP instruction and feedback;Language facilitation;Cueing hierarchy    Potential Considerations  Co-morbidities;Medical prognosis;Ability to learn/carryover information       Patient will benefit from skilled therapeutic intervention in order to improve the following deficits and impairments:   Aphasia    Problem List Patient Active Problem List   Diagnosis Date Noted  . Acute CVA (cerebrovascular accident) ([place]) 02/26/2020  . Type 2 diabetes mellitus without complication ([place]) XX123456  . Iliac artery aneurysm ([place]) 05/07/2019  . Prostate cancer screening 03/16/2017  . Full code status 03/16/2017  . Preventative health care 03/16/2017  . Need for hepatitis C screening test 10/14/2016  . Prediabetes 10/14/2016  . Peptic ulcer of stomach   . Calculus of gallbladder without cholecystitis without obstruction 09/27/2016  . Right nephrolithiasis 09/27/2016  . Status post right knee replacement 09/08/2016  . Medication monitoring encounter 10/30/2015  . Sleep pattern disturbance 06/26/2015  . Hx of smoking 06/26/2015  . Need for prophylactic vaccination against Streptococcus pneumoniae (pneumococcus) 06/26/2015  . Hypertension   . Hyperlipidemia   . Morbid obesity ([place])   . Hypothyroidism   . Degenerative arthritis of knee, bilateral 06/12/2015  . Chest pain, atypical 02/04/2015    Derrick Mckenzie 03/19/2020, 4:19  PM  [place] MAIN [facility] SERVICES [address] [place], [place], [address] Phone: [phone]   Fax:  [phone]   Name: Derrick Mckenzie MRN: [id number] Date of Birth: 09-Sep-1951

## 2020-03-24 ENCOUNTER — Ambulatory Visit: Payer: Medicare HMO | Admitting: Speech Pathology

## 2020-03-26 ENCOUNTER — Other Ambulatory Visit: Payer: Self-pay

## 2020-03-26 ENCOUNTER — Encounter: Payer: Self-pay | Admitting: Speech Pathology

## 2020-03-26 ENCOUNTER — Ambulatory Visit: Payer: Medicare HMO | Admitting: Speech Pathology

## 2020-03-26 DIAGNOSIS — R4701 Aphasia: Secondary | ICD-10-CM | POA: Diagnosis not present

## 2020-03-26 NOTE — Therapy (Signed)
[place] MAIN [facility] SERVICES [address] [place], [place], [address] Phone: [phone]   Fax:  [phone]  Speech Language Pathology Evaluation  Patient Details  Name: Derrick Mckenzie MRN: [id number] Date of Birth: 1951-02-12 Referring Provider (SLP): Toni Arthurs   Encounter Date: 03/26/2020  End of Session - 03/26/20 1239    Visit Number  4    Number of Visits  17    Date for SLP Re-Evaluation  05/06/20    Authorization - Visit Number  4    Authorization - Number of Visits  10    SLP Start Time  1100    SLP Stop Time   1200    SLP Time Calculation (min)  60 min    Activity Tolerance  Patient tolerated treatment well       Past Medical History:  Diagnosis Date  . Arthritis    knees  . Complication of anesthesia    aggitation after knee surgery  . Family history of adverse reaction to anesthesia    brother - hallucinations  . GERD (gastroesophageal reflux disease)   . Hx of smoking 06/26/2015  . Hyperlipidemia   . Hypertension   . Hypothyroidism   . Kidney stone   . Morbid obesity ([place])   . Murmur   . Prediabetes 10/14/2016  . Sleep apnea    score of 5 on apnea screen    Past Surgical History:  Procedure Laterality Date  . ESOPHAGOGASTRODUODENOSCOPY (EGD) WITH PROPOFOL N/A 10/05/2016   Procedure: ESOPHAGOGASTRODUODENOSCOPY (EGD) WITH PROPOFOL;  Surgeon: Jonathon Bellows, MD;  Location: [facility];  Service: Endoscopy;  Laterality: N/A;  sleep apnea  . HERNIA REPAIR    . REPLACEMENT TOTAL KNEE Right 2017  . TOTAL KNEE ARTHROPLASTY  1973    There were no vitals filed for this visit.  Subjective Assessment - 03/26/20 1234    Subjective  Patient with more awareness of deficits.          ADULT SLP TREATMENT - 03/26/20 0001      General Information   Behavior/Cognition  Alert;Cooperative;Pleasant mood      Treatment Provided   Treatment provided  Cognitive-Linquistic      Pain Assessment   Pain  Assessment  No/denies pain      Cognitive-Linquistic Treatment   Treatment focused on  Aphasia    Skilled Treatment  Patient was given a syllogism task, 95% accurate given min clinician cues. Patient described 5 items with 80% accuracy. The patient gave examples of strategies he uses in his daily life to facilitate communication. On a scale of 1-10 (1=my aphasia has no impact on me at all, 10=my aphasia has completely changed my life), the patient ranked himself at a 7.       Assessment / Recommendations / Plan   Plan  Continue with current plan of care      Progression Toward Goals   Progression toward goals  Progressing toward goals        SLP Education - 03/26/20 1235    Education Details  syllogisms    Person(s) Educated  Patient    Methods  Explanation    Comprehension  Verbalized understanding         SLP Long Term Goals - 03/11/20 1349      SLP LONG TERM GOAL #1   Title  Patient will generate grammatical and cogent phrase/sentence to complete simple/concrete linguistic task with 80% accuracy.    Time  8  Period  Weeks    Status  New    Target Date  05/06/20      SLP LONG TERM GOAL #2   Title  Patient will demonstrate reading comprehension for short paragraphs with 80% accuracy.    Time  8    Period  Weeks    Status  New    Target Date  05/06/20      SLP LONG TERM GOAL #3   Title  Patient will complete 3 unit processing tasks with 80% accuracy without the need of repetition of task instructions or significant delays in responding.    Time  8    Period  Weeks    Status  New    Target Date  05/06/20      SLP LONG TERM GOAL #4   Title  The patietn will participate in complex conversation at 80% accuracy given mod clinician cues.    Time  8    Period  Weeks    Status  New    Target Date  05/06/20       Plan - 03/26/20 1240    Clinical Impression Statement  Patient reports noticing his symptoms more, claims his friends do not notice. Patient reports he has  a difficult time determining what symptoms are due to aphasia and what symptoms are "general lack of knowledge". The patient has more insight into symptoms than previously noted. Per patient report, the patient says he would have ranked himself as a "2" during his first session and now would rank himself as a "7" on a scale of 1-10 in terms of how severely the aphasia affects his daily life. Patient spontaneous speech is characterized by word-finding difficulty, ungrammatical utterances and phonemic paraphasias.    Speech Therapy Frequency  2x / week    Duration  Other (comment)   8 weeks   Treatment/Interventions  Compensatory techniques;Compensatory strategies;SLP instruction and feedback;Language facilitation;Cueing hierarchy    Potential Considerations  Co-morbidities;Medical prognosis;Ability to learn/carryover information       Patient will benefit from skilled therapeutic intervention in order to improve the following deficits and impairments:   Aphasia    Problem List Patient Active Problem List   Diagnosis Date Noted  . Acute CVA (cerebrovascular accident) ([facility]) 02/26/2020  . Type 2 diabetes mellitus without complication ([place]) XX123456  . Iliac artery aneurysm ([place]) 05/07/2019  . Prostate cancer screening 03/16/2017  . Full code status 03/16/2017  . Preventative health care 03/16/2017  . Need for hepatitis C screening test 10/14/2016  . Prediabetes 10/14/2016  . Peptic ulcer of stomach   . Calculus of gallbladder without cholecystitis without obstruction 09/27/2016  . Right nephrolithiasis 09/27/2016  . Status post right knee replacement 09/08/2016  . Medication monitoring encounter 10/30/2015  . Sleep pattern disturbance 06/26/2015  . Hx of smoking 06/26/2015  . Need for prophylactic vaccination against Streptococcus pneumoniae (pneumococcus) 06/26/2015  . Hypertension   . Hyperlipidemia   . Morbid obesity ([place])   . Hypothyroidism   . Degenerative arthritis of knee,  bilateral 06/12/2015  . Chest pain, atypical 02/04/2015    Jordayn Mink 03/26/2020, 12:53 PM  [place] MAIN [facility] SERVICES [address] [place], [place], [address] Phone: [phone]   Fax:  [phone]  Name: Derrick Mckenzie MRN: [id number] Date of Birth: 1951/11/17

## 2020-03-30 ENCOUNTER — Ambulatory Visit: Payer: Medicare HMO | Admitting: Speech Pathology

## 2020-03-30 ENCOUNTER — Ambulatory Visit: Payer: Medicare HMO | Admitting: Gastroenterology

## 2020-04-03 ENCOUNTER — Ambulatory Visit: Payer: Medicare HMO | Admitting: Speech Pathology

## 2020-04-03 DIAGNOSIS — I69320 Aphasia following cerebral infarction: Secondary | ICD-10-CM | POA: Insufficient documentation

## 2020-04-03 DIAGNOSIS — G4733 Obstructive sleep apnea (adult) (pediatric): Secondary | ICD-10-CM | POA: Diagnosis not present

## 2020-04-03 DIAGNOSIS — Z8673 Personal history of transient ischemic attack (TIA), and cerebral infarction without residual deficits: Secondary | ICD-10-CM | POA: Diagnosis not present

## 2020-04-03 DIAGNOSIS — R0683 Snoring: Secondary | ICD-10-CM | POA: Diagnosis not present

## 2020-04-07 ENCOUNTER — Other Ambulatory Visit: Payer: Self-pay

## 2020-04-07 ENCOUNTER — Ambulatory Visit: Payer: Medicare HMO | Attending: Gerontology | Admitting: Speech Pathology

## 2020-04-07 DIAGNOSIS — R4701 Aphasia: Secondary | ICD-10-CM | POA: Insufficient documentation

## 2020-04-08 ENCOUNTER — Encounter: Payer: Self-pay | Admitting: Speech Pathology

## 2020-04-08 NOTE — Therapy (Signed)
[place] MAIN [facility] SERVICES [address] [place], [place], [address] Phone: [phone]   Fax:  [phone]  Speech Language Pathology Treatment  Patient Details  Name: Derrick Mckenzie MRN: [id number] Date of Birth: Dec 17, 1950 Referring Provider (SLP): Toni Arthurs   Encounter Date: 04/07/2020  End of Session - 04/08/20 1003    Visit Number  5    Number of Visits  17    Date for SLP Re-Evaluation  05/06/20    Authorization Type  Medicare    Authorization Time Period  03/11/2020    Authorization - Number of Visits  10    SLP Start Time  1600    SLP Stop Time   1700    SLP Time Calculation (min)  60 min    Activity Tolerance  Patient tolerated treatment well       Past Medical History:  Diagnosis Date  . Arthritis    knees  . Complication of anesthesia    aggitation after knee surgery  . Family history of adverse reaction to anesthesia    brother - hallucinations  . GERD (gastroesophageal reflux disease)   . Hx of smoking 06/26/2015  . Hyperlipidemia   . Hypertension   . Hypothyroidism   . Kidney stone   . Morbid obesity ([place])   . Murmur   . Prediabetes 10/14/2016  . Sleep apnea    score of 5 on apnea screen    Past Surgical History:  Procedure Laterality Date  . ESOPHAGOGASTRODUODENOSCOPY (EGD) WITH PROPOFOL N/A 10/05/2016   Procedure: ESOPHAGOGASTRODUODENOSCOPY (EGD) WITH PROPOFOL;  Surgeon: Jonathon Bellows, MD;  Location: [facility];  Service: Endoscopy;  Laterality: N/A;  sleep apnea  . HERNIA REPAIR    . REPLACEMENT TOTAL KNEE Right 2017  . TOTAL KNEE ARTHROPLASTY  1973    There were no vitals filed for this visit.  Subjective Assessment - 04/08/20 1002    Subjective  Patient with more awareness of deficits.    Patient is accompained by:  Family member            ADULT SLP TREATMENT - 04/08/20 0001      General Information   Behavior/Cognition  Alert;Cooperative;Pleasant mood      Treatment  Provided   Treatment provided  Cognitive-Linquistic      Pain Assessment   Pain Assessment  No/denies pain      Cognitive-Linquistic Treatment   Treatment focused on  Aphasia    Skilled Treatment  VERBAL EXPRESSION: The patient is able to have a conversation RE: abstract/complex topic (his medical condition). He demonstrates occasional word finding problems and semantic paraphasia.  Easily names abstract category given 3 members. Write word given definition with 80% accuracy and min cues.  READING/REASONING: Read deduction puzzle cues and complete deduction puzzle grid with min cues.      Assessment / Recommendations / Plan   Plan  Continue with current plan of care      Progression Toward Goals   Progression toward goals  Progressing toward goals           SLP Long Term Goals - 03/11/20 1349      SLP LONG TERM GOAL #1   Title  Patient will generate grammatical and cogent phrase/sentence to complete simple/concrete linguistic task with 80% accuracy.    Time  8    Period  Weeks    Status  New    Target Date  05/06/20      SLP LONG  TERM GOAL #2   Title  Patient will demonstrate reading comprehension for short paragraphs with 80% accuracy.    Time  8    Period  Weeks    Status  New    Target Date  05/06/20      SLP LONG TERM GOAL #3   Title  Patient will complete 3 unit processing tasks with 80% accuracy without the need of repetition of task instructions or significant delays in responding.    Time  8    Period  Weeks    Status  New    Target Date  05/06/20      SLP LONG TERM GOAL #4   Title  The patietn will participate in complex conversation at 80% accuracy given mod clinician cues.    Time  8    Period  Weeks    Status  New    Target Date  05/06/20       Plan - 04/08/20 1105    Clinical Impression Statement  The patient has missed approximately 1  weeks of therapy due to conflicts.  He returns with much improved conversation skills; he is effectively using  word finding strategies in conversation.    Speech Therapy Frequency  2x / week    Duration  Other (comment)    Treatment/Interventions  Compensatory techniques;Compensatory strategies;SLP instruction and feedback;Language facilitation;Cueing hierarchy    Potential Considerations  Co-morbidities;Medical prognosis;Ability to learn/carryover information    Consulted and Agree with Plan of Care  Patient;Family member/caregiver    Family Member Consulted  Spouse       Patient will benefit from skilled therapeutic intervention in order to improve the following deficits and impairments:   Aphasia    Problem List Patient Active Problem List   Diagnosis Date Noted  . Acute CVA (cerebrovascular accident) ([place]) 02/26/2020  . Type 2 diabetes mellitus without complication ([place]) XX123456  . Iliac artery aneurysm ([place]) 05/07/2019  . Prostate cancer screening 03/16/2017  . Full code status 03/16/2017  . Preventative health care 03/16/2017  . Need for hepatitis C screening test 10/14/2016  . Prediabetes 10/14/2016  . Peptic ulcer of stomach   . Calculus of gallbladder without cholecystitis without obstruction 09/27/2016  . Right nephrolithiasis 09/27/2016  . Status post right knee replacement 09/08/2016  . Medication monitoring encounter 10/30/2015  . Sleep pattern disturbance 06/26/2015  . Hx of smoking 06/26/2015  . Need for prophylactic vaccination against Streptococcus pneumoniae (pneumococcus) 06/26/2015  . Hypertension   . Hyperlipidemia   . Morbid obesity ([place])   . Hypothyroidism   . Degenerative arthritis of knee, bilateral 06/12/2015  . Chest pain, atypical 02/04/2015   Leroy Sea, MS/CCC- SLP  Lou Miner 04/08/2020, 11:06 AM  [place] MAIN [facility] SERVICES [address] [place], [place], [address] Phone: [phone]   Fax:  [phone]   Name: Derrick Mckenzie MRN: [id number] Date of Birth: 01/25/1951

## 2020-04-14 ENCOUNTER — Ambulatory Visit: Payer: Medicare HMO | Admitting: Speech Pathology

## 2020-04-17 ENCOUNTER — Encounter: Payer: Self-pay | Admitting: Speech Pathology

## 2020-04-17 ENCOUNTER — Other Ambulatory Visit: Payer: Self-pay

## 2020-04-17 ENCOUNTER — Ambulatory Visit: Payer: Medicare HMO | Admitting: Speech Pathology

## 2020-04-17 DIAGNOSIS — R4701 Aphasia: Secondary | ICD-10-CM | POA: Diagnosis not present

## 2020-04-17 NOTE — Therapy (Signed)
[place] MAIN [facility] SERVICES [address] [place], [place], [address] Phone: [phone]   Fax:  [phone]  Speech Language Pathology Treatment  Patient Details  Name: Derrick Mckenzie MRN: [id number] Date of Birth: 24-Dec-1950 Referring Provider (SLP): Toni Arthurs   Encounter Date: 04/17/2020  End of Session - 04/17/20 1213    Visit Number  6    Number of Visits  17    Date for SLP Re-Evaluation  05/06/20    Authorization Type  Medicare    Authorization Time Period  03/11/2020    Authorization - Visit Number  6    Authorization - Number of Visits  10    SLP Start Time  1000    SLP Stop Time   1100    SLP Time Calculation (min)  60 min    Activity Tolerance  Patient tolerated treatment well       Past Medical History:  Diagnosis Date  . Arthritis    knees  . Complication of anesthesia    aggitation after knee surgery  . Family history of adverse reaction to anesthesia    brother - hallucinations  . GERD (gastroesophageal reflux disease)   . Hx of smoking 06/26/2015  . Hyperlipidemia   . Hypertension   . Hypothyroidism   . Kidney stone   . Morbid obesity ([place])   . Murmur   . Prediabetes 10/14/2016  . Sleep apnea    score of 5 on apnea screen    Past Surgical History:  Procedure Laterality Date  . ESOPHAGOGASTRODUODENOSCOPY (EGD) WITH PROPOFOL N/A 10/05/2016   Procedure: ESOPHAGOGASTRODUODENOSCOPY (EGD) WITH PROPOFOL;  Surgeon: Jonathon Bellows, MD;  Location: [facility];  Service: Endoscopy;  Laterality: N/A;  sleep apnea  . HERNIA REPAIR    . REPLACEMENT TOTAL KNEE Right 2017  . TOTAL KNEE ARTHROPLASTY  1973    There were no vitals filed for this visit.  Subjective Assessment - 04/17/20 1213    Subjective  Patient with more awareness of deficits.            ADULT SLP TREATMENT - 04/17/20 0001      General Information   Behavior/Cognition  Alert;Cooperative;Pleasant mood      Treatment Provided    Treatment provided  Cognitive-Linquistic      Pain Assessment   Pain Assessment  No/denies pain      Cognitive-Linquistic Treatment   Treatment focused on  Aphasia    Skilled Treatment  VERBAL EXPRESSION: The patient is able to have a conversation RE: abstract/complex topic and related story from his past. He demonstrates occasional word finding problems and semantic paraphasia.  Given 2 related items, state category, 2 similarities, 2 differences, and another item in category given mod-max cues.  State item that does not belong in list of 5 and state reason with 90% accuracy.  READING/REASONING: Read deduction puzzle cues and complete deduction puzzle grid with min-to-mod cues.   The patient appeared to fatigue as he completed the first puzzle with min cues and required more cuing for the second.      Assessment / Recommendations / Plan   Plan  Continue with current plan of care      Progression Toward Goals   Progression toward goals  Progressing toward goals       SLP Education - 04/17/20 1213    Education Details  deductive reasoning    Person(s) Educated  Patient    Methods  Explanation  Comprehension  Verbalized understanding         SLP Long Term Goals - 03/11/20 1349      SLP LONG TERM GOAL #1   Title  Patient will generate grammatical and cogent phrase/sentence to complete simple/concrete linguistic task with 80% accuracy.    Time  8    Period  Weeks    Status  New    Target Date  05/06/20      SLP LONG TERM GOAL #2   Title  Patient will demonstrate reading comprehension for short paragraphs with 80% accuracy.    Time  8    Period  Weeks    Status  New    Target Date  05/06/20      SLP LONG TERM GOAL #3   Title  Patient will complete 3 unit processing tasks with 80% accuracy without the need of repetition of task instructions or significant delays in responding.    Time  8    Period  Weeks    Status  New    Target Date  05/06/20      SLP LONG TERM GOAL #4    Title  The patietn will participate in complex conversation at 80% accuracy given mod clinician cues.    Time  8    Period  Weeks    Status  New    Target Date  05/06/20       Plan - 04/17/20 1215    Clinical Impression Statement  The patient continues to demonstrate with much improved conversation skills; he is effectively using word finding strategies in conversation.  He is completing simple cognitive linguistic tasks easily with minimal cuing required.  However, as the level of complexity and abstraction increases the level of required cuing increases.    Speech Therapy Frequency  2x / week    Duration  Other (comment)    Treatment/Interventions  Compensatory techniques;Compensatory strategies;SLP instruction and feedback;Language facilitation;Cueing hierarchy    Potential to Achieve Goals  Good    Potential Considerations  Co-morbidities;Medical prognosis;Ability to learn/carryover information;Previous level of function;Severity of impairments;Cooperation/participation level    Consulted and Agree with Plan of Care  Patient       Patient will benefit from skilled therapeutic intervention in order to improve the following deficits and impairments:   Aphasia    Problem List Patient Active Problem List   Diagnosis Date Noted  . Acute CVA (cerebrovascular accident) ([place]) 02/26/2020  . Type 2 diabetes mellitus without complication ([place]) XX123456  . Iliac artery aneurysm ([place]) 05/07/2019  . Prostate cancer screening 03/16/2017  . Full code status 03/16/2017  . Preventative health care 03/16/2017  . Need for hepatitis C screening test 10/14/2016  . Prediabetes 10/14/2016  . Peptic ulcer of stomach   . Calculus of gallbladder without cholecystitis without obstruction 09/27/2016  . Right nephrolithiasis 09/27/2016  . Status post right knee replacement 09/08/2016  . Medication monitoring encounter 10/30/2015  . Sleep pattern disturbance 06/26/2015  . Hx of smoking 06/26/2015   . Need for prophylactic vaccination against Streptococcus pneumoniae (pneumococcus) 06/26/2015  . Hypertension   . Hyperlipidemia   . Morbid obesity ([place])   . Hypothyroidism   . Degenerative arthritis of knee, bilateral 06/12/2015  . Chest pain, atypical 02/04/2015    Leroy Sea, MS/CCC- SLP  Lou Miner 04/17/2020, 12:16 PM  [place] MAIN [facility] SERVICES [address] [place], [place], [address] Phone: [phone]   Fax:  [phone]   Name:  Derrick Mckenzie MRN: [id number] Date of Birth: May 08, 1951

## 2020-04-22 ENCOUNTER — Encounter: Payer: Self-pay | Admitting: Speech Pathology

## 2020-04-23 DIAGNOSIS — M25542 Pain in joints of left hand: Secondary | ICD-10-CM | POA: Diagnosis not present

## 2020-04-23 DIAGNOSIS — E119 Type 2 diabetes mellitus without complications: Secondary | ICD-10-CM | POA: Diagnosis not present

## 2020-04-23 DIAGNOSIS — Z6841 Body Mass Index (BMI) 40.0 and over, adult: Secondary | ICD-10-CM | POA: Diagnosis not present

## 2020-04-23 DIAGNOSIS — S62357A Nondisplaced fracture of shaft of fifth metacarpal bone, left hand, initial encounter for closed fracture: Secondary | ICD-10-CM | POA: Diagnosis not present

## 2020-04-24 ENCOUNTER — Ambulatory Visit: Payer: Medicare HMO | Admitting: Speech Pathology

## 2020-04-28 ENCOUNTER — Ambulatory Visit: Payer: Medicare HMO | Admitting: Speech Pathology

## 2020-05-05 ENCOUNTER — Ambulatory Visit: Payer: Medicare HMO | Admitting: Speech Pathology

## 2020-05-07 ENCOUNTER — Ambulatory Visit: Payer: Medicare HMO | Admitting: Speech Pathology

## 2020-05-08 ENCOUNTER — Other Ambulatory Visit (INDEPENDENT_AMBULATORY_CARE_PROVIDER_SITE_OTHER): Payer: Self-pay | Admitting: Vascular Surgery

## 2020-05-08 ENCOUNTER — Other Ambulatory Visit: Payer: Self-pay

## 2020-05-08 ENCOUNTER — Ambulatory Visit (INDEPENDENT_AMBULATORY_CARE_PROVIDER_SITE_OTHER): Payer: Medicare HMO | Admitting: Vascular Surgery

## 2020-05-08 ENCOUNTER — Encounter (INDEPENDENT_AMBULATORY_CARE_PROVIDER_SITE_OTHER): Payer: Self-pay | Admitting: Vascular Surgery

## 2020-05-08 ENCOUNTER — Ambulatory Visit (INDEPENDENT_AMBULATORY_CARE_PROVIDER_SITE_OTHER): Payer: Medicare HMO

## 2020-05-08 VITALS — BP 122/80 | HR 62 | Resp 16 | Wt 304.8 lb

## 2020-05-08 DIAGNOSIS — I639 Cerebral infarction, unspecified: Secondary | ICD-10-CM

## 2020-05-08 DIAGNOSIS — I10 Essential (primary) hypertension: Secondary | ICD-10-CM | POA: Diagnosis not present

## 2020-05-08 DIAGNOSIS — I723 Aneurysm of iliac artery: Secondary | ICD-10-CM

## 2020-05-08 DIAGNOSIS — E119 Type 2 diabetes mellitus without complications: Secondary | ICD-10-CM | POA: Diagnosis not present

## 2020-05-08 DIAGNOSIS — E782 Mixed hyperlipidemia: Secondary | ICD-10-CM

## 2020-05-08 NOTE — Progress Notes (Signed)
MRN : [id number]  Derrick Mckenzie is a 69 y.o. (Dec 22, 1950) male who presents with chief complaint of  Chief Complaint  Patient presents with  . Follow-up    ultrasound follow up  .  History of Present Illness: Patient returns today in follow up of his iliac artery aneurysm.  Since his last visit, he has had a stroke.  He has some mild residual speech deficits although these are really not noticeable today.  He does not have any arm or leg weakness or speech or swallowing difficulty. No aneurysm related symptoms. Specifically, the patient denies new back or abdominal pain, or signs of peripheral embolization. His right iliac aneurysm measures 1.4 cm today which is unchanged from previous study. The left iliac is 1.2 cm. The aorta is not aneurysmal.  Current Outpatient Medications  Medication Sig Dispense Refill  . albuterol (PROVENTIL HFA;VENTOLIN HFA) 108 (90 Base) MCG/ACT inhaler Inhale 2 puffs into the lungs every 6 (six) hours as needed for wheezing or shortness of breath. 1 Inhaler 2  . amLODipine (NORVASC) 5 MG tablet Take 5 mg by mouth daily.    Marland Kitchen aspirin EC 81 MG tablet Take 81 mg by mouth daily. Swallow whole.    Marland Kitchen atorvastatin (LIPITOR) 40 MG tablet Take 1 tablet (40 mg total) by mouth daily. 30 tablet 0  . clopidogrel (PLAVIX) 75 MG tablet Take 1 tablet (75 mg total) by mouth daily. 30 tablet 0  . furosemide (LASIX) 40 MG tablet Take 40 mg by mouth daily.     Marland Kitchen levothyroxine (SYNTHROID) 100 MCG tablet Take 100 mcg by mouth daily.    . metFORMIN (GLUCOPHAGE-XR) 500 MG 24 hr tablet Take 500 mg by mouth in the morning and at bedtime.    Marland Kitchen omega-3 acid ethyl esters (LOVAZA) 1 g capsule Take 2 g by mouth daily.     . Fluticasone-Salmeterol (ADVAIR) 100-50 MCG/DOSE AEPB Inhale 1 puff into the lungs 2 (two) times daily. (Patient not taking: Reported on 05/08/2020)    . Multiple Vitamin (MULTIVITAMIN WITH MINERALS) TABS tablet Take 1 tablet by mouth daily. (Patient not taking:  Reported on 05/08/2020)     No current facility-administered medications for this visit.    Past Medical History:  Diagnosis Date  . Arthritis    knees  . Complication of anesthesia    aggitation after knee surgery  . Family history of adverse reaction to anesthesia    brother - hallucinations  . GERD (gastroesophageal reflux disease)   . Hx of smoking 06/26/2015  . Hyperlipidemia   . Hypertension   . Hypothyroidism   . Kidney stone   . Morbid obesity ([place])   . Murmur   . Prediabetes 10/14/2016  . Sleep apnea    score of 5 on apnea screen    Past Surgical History:  Procedure Laterality Date  . ESOPHAGOGASTRODUODENOSCOPY (EGD) WITH PROPOFOL N/A 10/05/2016   Procedure: ESOPHAGOGASTRODUODENOSCOPY (EGD) WITH PROPOFOL;  Surgeon: Jonathon Bellows, MD;  Location: [facility];  Service: Endoscopy;  Laterality: N/A;  sleep apnea  . HERNIA REPAIR    . REPLACEMENT TOTAL KNEE Right 2017  . TOTAL KNEE ARTHROPLASTY  1973     Social History   Tobacco Use  . Smoking status: Former Smoker    Packs/day: 0.00    Years: 10.00    Pack years: 0.00    Types: Cigarettes    Quit date: 02/03/2016    Years since quitting: 4.2  . Smokeless tobacco: Never Used  Substance Use Topics  . Alcohol use: Yes    Alcohol/week: 0.0 standard drinks    Comment: socially - Holidays  . Drug use: No      Family History  Problem Relation Age of Onset  . Cancer Mother        leukemia  . Diabetes Brother   . Blindness Brother   . Heart attack Maternal Grandfather   . Prostate cancer Neg Hx   . Bladder Cancer Neg Hx   . Kidney cancer Neg Hx      No Known Allergies    REVIEW OF SYSTEMS (Negative unless checked)  Constitutional: [] ?Weight loss  [] ?Fever  [] ?Chills Cardiac: [] ?Chest pain   [] ?Chest pressure   [] ?Palpitations   [] ?Shortness of breath when laying flat   [] ?Shortness of breath at rest   [] ?Shortness of breath with exertion. Vascular:  [] ?Pain in legs with walking   [] ?Pain in  legs at rest   [] ?Pain in legs when laying flat   [] ?Claudication   [] ?Pain in feet when walking  [] ?Pain in feet at rest  [] ?Pain in feet when laying flat   [] ?History of DVT   [] ?Phlebitis   [] ?Swelling in legs   [] ?Varicose veins   [] ?Non-healing ulcers Pulmonary:   [] ?Uses home oxygen   [] ?Productive cough   [] ?Hemoptysis   [] ?Wheeze  [] ?COPD   [] ?Asthma Neurologic:  [] ?Dizziness  [] ?Blackouts   [] ?Seizures   [] ?History of stroke   [] ?History of TIA  [] ?Aphasia   [] ?Temporary blindness   [] ?Dysphagia   [] ?Weakness or numbness in arms   [] ?Weakness or numbness in legs Musculoskeletal:  [x] ?Arthritis   [] ?Joint swelling   [x] ?Joint pain   [] ?Low back pain Hematologic:  [] ?Easy bruising  [] ?Easy bleeding   [] ?Hypercoagulable state   [] ?Anemic  [] ?Hepatitis Gastrointestinal:  [] ?Blood in stool   [] ?Vomiting blood  [x] ?Gastroesophageal reflux/heartburn   [] ?Abdominal pain Genitourinary:  [] ?Chronic kidney disease   [] ?Difficult urination  [] ?Frequent urination  [] ?Burning with urination   [] ?Hematuria Skin:  [] ?Rashes   [] ?Ulcers   [] ?Wounds Psychological:  [] ?History of anxiety   [] ? History of major depression.  Physical Examination  BP 122/80 (BP Location: Right Arm)   Pulse 62   Resp 16   Wt (!) 304 lb 12.8 oz (138.3 kg)   BMI 46.34 kg/m  Gen:  WD/WN, NAD Head: [place]/AT, No temporalis wasting. Ear/Nose/Throat: Hearing grossly intact, nares w/o erythema or drainage Eyes: Conjunctiva clear. Sclera non-icteric Neck: Supple.  Trachea midline Pulmonary:  Good air movement, no use of accessory muscles.  Cardiac: RRR, no JVD Vascular:  Vessel Right Left  Radial Palpable Palpable       Musculoskeletal: M/S 5/5 throughout.  No deformity or atrophy. No edema. Neurologic: Sensation grossly intact in extremities.  Symmetrical.  Speech is fluent.  Psychiatric: Judgment intact, Mood & affect appropriate for pt's clinical situation. Dermatologic: No rashes or ulcers noted.  No cellulitis or open  wounds.       Labs Recent Results (from the past 2160 hour(s))  Protime-INR     Status: None   Collection Time: 02/26/20 10:53 AM  Result Value Ref Range   Prothrombin Time 13.0 11.4 - 15.2 seconds   INR 1.0 0.8 - 1.2    Comment: (NOTE) INR goal varies based on device and disease states. Performed at [facility], [address] [place], [place] [address]   APTT     Status: None   Collection Time: 02/26/20 10:53 AM  Result Value Ref Range  aPTT 29 24 - 36 seconds    Comment: Performed at [facility], [address] [place], [place] [address]  CBC     Status: None   Collection Time: 02/26/20 10:53 AM  Result Value Ref Range   WBC 7.8 4.0 - 10.5 K/uL   RBC 4.71 4.22 - 5.81 MIL/uL   Hemoglobin 14.3 13.0 - 17.0 g/dL   HCT 42.6 39 - 52 %   MCV 90.4 80.0 - 100.0 fL   MCH 30.4 26.0 - 34.0 pg   MCHC 33.6 30.0 - 36.0 g/dL   RDW 13.6 11.5 - 15.5 %   Platelets 197 150 - 400 K/uL   nRBC 0.0 0.0 - 0.2 %    Comment: Performed at [facility], [address] [place], [place] [address]  Differential     Status: None   Collection Time: 02/26/20 10:53 AM  Result Value Ref Range   Neutrophils Relative % 69 %   Neutro Abs 5.5 1.7 - 7.7 K/uL   Lymphocytes Relative 18 %   Lymphs Abs 1.4 0.7 - 4.0 K/uL   Monocytes Relative 8 %   Monocytes Absolute 0.6 0 - 1 K/uL   Eosinophils Relative 3 %   Eosinophils Absolute 0.2 0 - 0 K/uL   Basophils Relative 1 %   Basophils Absolute 0.1 0 - 0 K/uL   Immature Granulocytes 1 %   Abs Immature Granulocytes 0.06 0.00 - 0.07 K/uL    Comment: Performed at [facility], [address], [place], [place] [address]  Comprehensive metabolic panel     Status: Abnormal   Collection Time: 02/26/20 10:53 AM  Result Value Ref Range   Sodium 139 135 - 145 mmol/L   Potassium 3.9 3.5 - 5.1 mmol/L   Chloride 101 98 - 111 mmol/L   CO2 27 22 - 32 mmol/L   Glucose, Bld 171 (H) 70 - 99 mg/dL    Comment: Glucose  reference range applies only to samples taken after fasting for at least 8 hours.   BUN 28 (H) 8 - 23 mg/dL   Creatinine, Ser 1.11 0.61 - 1.24 mg/dL   Calcium 9.6 8.9 - 10.3 mg/dL   Total Protein 7.7 6.5 - 8.1 g/dL   Albumin 4.2 3.5 - 5.0 g/dL   AST 20 15 - 41 U/L   ALT 17 0 - 44 U/L   Alkaline Phosphatase 51 38 - 126 U/L   Total Bilirubin 1.1 0.3 - 1.2 mg/dL   GFR calc non Af Amer >60 >60 mL/min   GFR calc Af Amer >60 >60 mL/min   Anion gap 11 5 - 15    Comment: Performed at [facility], [address] [place], [place] [address]  Hemoglobin A1c     Status: Abnormal   Collection Time: 02/26/20 10:53 AM  Result Value Ref Range   Hgb A1c MFr Bld 6.3 (H) 4.8 - 5.6 %    Comment: (NOTE) Pre diabetes:          5.7%-6.4% Diabetes:              >6.4% Glycemic control for   <7.0% adults with diabetes    Mean Plasma Glucose 134.11 mg/dL    Comment: Performed at [facility] [address] [place], [place] [address]  SARS CORONAVIRUS 2 (TAT 6-24 HRS) Nasopharyngeal Nasopharyngeal Swab     Status: None   Collection Time: 02/26/20 11:32 AM   Specimen: Nasopharyngeal Swab  Result Value  Ref Range   SARS Coronavirus 2 NEGATIVE NEGATIVE    Comment: (NOTE) SARS-CoV-2 target nucleic acids are NOT DETECTED. The SARS-CoV-2 RNA is generally detectable in upper and lower respiratory specimens during the acute phase of infection. Negative results do not preclude SARS-CoV-2 infection, do not rule out co-infections with other pathogens, and should not be used as the sole basis for treatment or other patient management decisions. Negative results must be combined with clinical observations, patient history, and epidemiological information. The expected result is Negative. Fact Sheet for Patients: SugarRoll.be Fact Sheet for Healthcare Providers: https://www.woods-mathews.com/ This test is not yet approved or cleared by the [place]  FDA and  has been authorized for detection and/or diagnosis of SARS-CoV-2 by FDA under an Emergency Use Authorization (EUA). This EUA will remain  in effect (meaning this test can be used) for the duration of the COVID-19 declaration under Section 56 4(b)(1) of the Act, 21 U.S.C. section 360bbb-3(b)(1), unless the authorization is terminated or revoked sooner. Performed at [facility], [place] [address], [place], [place] [address]   Glucose, capillary     Status: Abnormal   Collection Time: 02/26/20  4:58 PM  Result Value Ref Range   Glucose-Capillary 117 (H) 70 - 99 mg/dL    Comment: Glucose reference range applies only to samples taken after fasting for at least 8 hours.  HIV Antibody (routine testing w rflx)     Status: None   Collection Time: 02/27/20  5:00 AM  Result Value Ref Range   HIV Screen 4th Generation wRfx NON REACTIVE NON REACTIVE    Comment: Performed at [facility], [address], [place], [place] [address]  Hemoglobin A1c     Status: Abnormal   Collection Time: 02/27/20  5:00 AM  Result Value Ref Range   Hgb A1c MFr Bld 6.2 (H) 4.8 - 5.6 %    Comment: (NOTE) Pre diabetes:          5.7%-6.4% Diabetes:              >6.4% Glycemic control for   <7.0% adults with diabetes    Mean Plasma Glucose 131.24 mg/dL    Comment: Performed at [facility] [address] [place], [place] [address]  Lipid panel     Status: Abnormal   Collection Time: 02/27/20  5:00 AM  Result Value Ref Range   Cholesterol 137 0 - 200 mg/dL   Triglycerides 237 (H) <150 mg/dL   HDL 28 (L) >40 mg/dL   Total CHOL/HDL Ratio 4.9 RATIO   VLDL 47 (H) 0 - 40 mg/dL   LDL Cholesterol 62 0 - 99 mg/dL    Comment:        Total Cholesterol/HDL:CHD Risk Coronary Heart Disease Risk Table                     Men   Women  1/2 Average Risk   3.4   3.3  Average Risk       5.0   4.4  2 X Average Risk   9.6   7.1  3 X Average Risk  23.4   11.0        Use the calculated Patient  Ratio above and the CHD Risk Table to determine the patient's CHD Risk.        ATP III CLASSIFICATION (LDL):  <100     mg/dL   Optimal  100-129  mg/dL   Near or Above  Optimal  130-159  mg/dL   Borderline  160-189  mg/dL   High  >190     mg/dL   Very High Performed at [facility], [address] [place], [place] [address]   Glucose, capillary     Status: Abnormal   Collection Time: 02/27/20  7:48 AM  Result Value Ref Range   Glucose-Capillary 145 (H) 70 - 99 mg/dL    Comment: Glucose reference range applies only to samples taken after fasting for at least 8 hours.  ECHOCARDIOGRAM COMPLETE     Status: None   Collection Time: 02/27/20  8:54 AM  Result Value Ref Range   Weight 4,800 oz   Height 68 in   BP 103/58 mmHg  Glucose, capillary     Status: Abnormal   Collection Time: 02/27/20 11:14 AM  Result Value Ref Range   Glucose-Capillary 140 (H) 70 - 99 mg/dL    Comment: Glucose reference range applies only to samples taken after fasting for at least 8 hours.  Glucose, capillary     Status: Abnormal   Collection Time: 02/27/20  4:10 PM  Result Value Ref Range   Glucose-Capillary 140 (H) 70 - 99 mg/dL    Comment: Glucose reference range applies only to samples taken after fasting for at least 8 hours.  Glucose, capillary     Status: Abnormal   Collection Time: 02/27/20 10:12 PM  Result Value Ref Range   Glucose-Capillary 118 (H) 70 - 99 mg/dL    Comment: Glucose reference range applies only to samples taken after fasting for at least 8 hours.  Glucose, capillary     Status: Abnormal   Collection Time: 02/28/20  7:32 AM  Result Value Ref Range   Glucose-Capillary 145 (H) 70 - 99 mg/dL    Comment: Glucose reference range applies only to samples taken after fasting for at least 8 hours.  Glucose, capillary     Status: Abnormal   Collection Time: 02/28/20 11:27 AM  Result Value Ref Range   Glucose-Capillary 194 (H) 70 - 99 mg/dL    Comment: Glucose  reference range applies only to samples taken after fasting for at least 8 hours.    Radiology VAS [place] AAA DUPLEX  Result Date: 05/08/2020 ABDOMINAL AORTA STUDY Indications: rt CIA dilitation  Comparison Study: 05/07/2019 Performing Technologist: Concha Norway RVT  Examination Guidelines: A complete evaluation includes B-mode imaging, spectral Doppler, color Doppler, and power Doppler as needed of all accessible portions of each vessel. Bilateral testing is considered an integral part of a complete examination. Limited examinations for reoccurring indications may be performed as noted.  Abdominal Aorta Findings: +-----------+-------+----------+----------+--------+--------+--------+ Location   AP (cm)Trans (cm)PSV (cm/s)WaveformThrombusComments +-----------+-------+----------+----------+--------+--------+--------+ Proximal   2.50   2.49      102                                +-----------+-------+----------+----------+--------+--------+--------+ Mid        1.97   1.88      81                                 +-----------+-------+----------+----------+--------+--------+--------+ Distal     1.78   1.83      80                                 +-----------+-------+----------+----------+--------+--------+--------+  RT CIA Prox1.4    1.3       61                                 +-----------+-------+----------+----------+--------+--------+--------+ LT CIA Prox1.2    1.2       64                                 +-----------+-------+----------+----------+--------+--------+--------+  Summary: Abdominal Aorta: No evidence of an abdominal aortic aneurysm was visualized. Right CIA is mildly dilated as was seen previously with no change.  *See table(s) above for measurements and observations.  Electronically signed by Leotis Pain MD on 05/08/2020 at 8:30:12 AM.   Final     Assessment/Plan Hypertension blood pressure control important in reducing the progression of atherosclerotic  disease and aneurysmal growth. On appropriate oral medications.   Hyperlipidemia lipid control important in reducing the progression of atherosclerotic disease. Continue statin therapy  Type 2 diabetes mellitus without complication (HCC) blood glucose control important in reducing the progression of atherosclerotic disease. Also, involved in wound healing. On appropriate medications.   Acute CVA (cerebrovascular accident) ([place]) Mild residual speech deficits.  Iliac artery aneurysm (HCC) His right iliac aneurysm measures 1.4 cm today which is unchanged from previous study. The left iliac is 1.2 cm. The aorta is not aneurysmal. No changes. Recheck in one year.    Leotis Pain, MD  05/08/2020 9:05 AM    This note was created with Dragon medical transcription system.  Any errors from dictation are purely unintentional

## 2020-05-08 NOTE — Assessment & Plan Note (Signed)
Mild residual speech deficits.

## 2020-05-08 NOTE — Assessment & Plan Note (Signed)
blood glucose control important in reducing the progression of atherosclerotic disease. Also, involved in wound healing. On appropriate medications.  

## 2020-05-08 NOTE — Assessment & Plan Note (Signed)
His right iliac aneurysm measures 1.4 cm today which is unchanged from previous study. The left iliac is 1.2 cm. The aorta is not aneurysmal. No changes. Recheck in one year.

## 2020-05-12 ENCOUNTER — Ambulatory Visit: Payer: Medicare HMO | Admitting: Speech Pathology

## 2020-05-14 ENCOUNTER — Ambulatory Visit: Payer: Medicare HMO | Admitting: Speech Pathology

## 2020-05-14 DIAGNOSIS — S62357D Nondisplaced fracture of shaft of fifth metacarpal bone, left hand, subsequent encounter for fracture with routine healing: Secondary | ICD-10-CM | POA: Diagnosis not present

## 2020-05-14 DIAGNOSIS — M25542 Pain in joints of left hand: Secondary | ICD-10-CM | POA: Diagnosis not present

## 2020-05-14 DIAGNOSIS — E119 Type 2 diabetes mellitus without complications: Secondary | ICD-10-CM | POA: Diagnosis not present

## 2020-05-14 DIAGNOSIS — Z6841 Body Mass Index (BMI) 40.0 and over, adult: Secondary | ICD-10-CM | POA: Diagnosis not present

## 2020-05-19 ENCOUNTER — Ambulatory Visit: Payer: Medicare HMO | Admitting: Speech Pathology

## 2020-05-21 ENCOUNTER — Ambulatory Visit: Payer: Medicare HMO | Admitting: Speech Pathology

## 2020-05-24 DIAGNOSIS — G4733 Obstructive sleep apnea (adult) (pediatric): Secondary | ICD-10-CM | POA: Diagnosis not present

## 2020-05-26 ENCOUNTER — Ambulatory Visit: Payer: Medicare HMO | Admitting: Speech Pathology

## 2020-07-03 DIAGNOSIS — I2583 Coronary atherosclerosis due to lipid rich plaque: Secondary | ICD-10-CM | POA: Diagnosis not present

## 2020-07-03 DIAGNOSIS — I350 Nonrheumatic aortic (valve) stenosis: Secondary | ICD-10-CM | POA: Diagnosis not present

## 2020-07-03 DIAGNOSIS — R0602 Shortness of breath: Secondary | ICD-10-CM | POA: Diagnosis not present

## 2020-07-03 DIAGNOSIS — I639 Cerebral infarction, unspecified: Secondary | ICD-10-CM | POA: Diagnosis not present

## 2020-07-03 DIAGNOSIS — I723 Aneurysm of iliac artery: Secondary | ICD-10-CM | POA: Diagnosis not present

## 2020-07-03 DIAGNOSIS — I5032 Chronic diastolic (congestive) heart failure: Secondary | ICD-10-CM | POA: Diagnosis not present

## 2020-07-03 DIAGNOSIS — I10 Essential (primary) hypertension: Secondary | ICD-10-CM | POA: Diagnosis not present

## 2020-07-03 DIAGNOSIS — I2510 Atherosclerotic heart disease of native coronary artery without angina pectoris: Secondary | ICD-10-CM | POA: Diagnosis not present

## 2020-07-20 DIAGNOSIS — Z0000 Encounter for general adult medical examination without abnormal findings: Secondary | ICD-10-CM | POA: Diagnosis not present

## 2020-07-20 DIAGNOSIS — E039 Hypothyroidism, unspecified: Secondary | ICD-10-CM | POA: Diagnosis not present

## 2020-07-20 DIAGNOSIS — E119 Type 2 diabetes mellitus without complications: Secondary | ICD-10-CM | POA: Diagnosis not present

## 2020-07-20 DIAGNOSIS — I10 Essential (primary) hypertension: Secondary | ICD-10-CM | POA: Diagnosis not present

## 2020-07-20 DIAGNOSIS — E782 Mixed hyperlipidemia: Secondary | ICD-10-CM | POA: Diagnosis not present

## 2020-07-20 DIAGNOSIS — Z125 Encounter for screening for malignant neoplasm of prostate: Secondary | ICD-10-CM | POA: Diagnosis not present

## 2020-07-27 DIAGNOSIS — I2583 Coronary atherosclerosis due to lipid rich plaque: Secondary | ICD-10-CM | POA: Diagnosis not present

## 2020-07-27 DIAGNOSIS — I2510 Atherosclerotic heart disease of native coronary artery without angina pectoris: Secondary | ICD-10-CM | POA: Diagnosis not present

## 2020-07-27 DIAGNOSIS — I8393 Asymptomatic varicose veins of bilateral lower extremities: Secondary | ICD-10-CM | POA: Diagnosis not present

## 2020-07-27 DIAGNOSIS — Z01818 Encounter for other preprocedural examination: Secondary | ICD-10-CM | POA: Diagnosis not present

## 2020-07-27 DIAGNOSIS — I350 Nonrheumatic aortic (valve) stenosis: Secondary | ICD-10-CM | POA: Diagnosis not present

## 2020-07-29 DIAGNOSIS — I8393 Asymptomatic varicose veins of bilateral lower extremities: Secondary | ICD-10-CM | POA: Diagnosis not present

## 2020-07-29 DIAGNOSIS — Z01818 Encounter for other preprocedural examination: Secondary | ICD-10-CM | POA: Diagnosis not present

## 2020-08-18 DIAGNOSIS — Z6841 Body Mass Index (BMI) 40.0 and over, adult: Secondary | ICD-10-CM | POA: Diagnosis not present

## 2020-08-18 DIAGNOSIS — I2583 Coronary atherosclerosis due to lipid rich plaque: Secondary | ICD-10-CM | POA: Diagnosis not present

## 2020-08-18 DIAGNOSIS — I5032 Chronic diastolic (congestive) heart failure: Secondary | ICD-10-CM | POA: Diagnosis not present

## 2020-08-18 DIAGNOSIS — I69320 Aphasia following cerebral infarction: Secondary | ICD-10-CM | POA: Diagnosis not present

## 2020-08-18 DIAGNOSIS — G4733 Obstructive sleep apnea (adult) (pediatric): Secondary | ICD-10-CM | POA: Diagnosis not present

## 2020-08-18 DIAGNOSIS — I2510 Atherosclerotic heart disease of native coronary artery without angina pectoris: Secondary | ICD-10-CM | POA: Diagnosis not present

## 2020-08-24 DIAGNOSIS — Z952 Presence of prosthetic heart valve: Secondary | ICD-10-CM | POA: Diagnosis not present

## 2020-08-24 DIAGNOSIS — I723 Aneurysm of iliac artery: Secondary | ICD-10-CM | POA: Diagnosis not present

## 2020-08-24 DIAGNOSIS — G8918 Other acute postprocedural pain: Secondary | ICD-10-CM | POA: Diagnosis not present

## 2020-08-24 DIAGNOSIS — R918 Other nonspecific abnormal finding of lung field: Secondary | ICD-10-CM | POA: Diagnosis not present

## 2020-08-24 DIAGNOSIS — D62 Acute posthemorrhagic anemia: Secondary | ICD-10-CM | POA: Diagnosis not present

## 2020-08-24 DIAGNOSIS — I358 Other nonrheumatic aortic valve disorders: Secondary | ICD-10-CM | POA: Diagnosis not present

## 2020-08-24 DIAGNOSIS — I110 Hypertensive heart disease with heart failure: Secondary | ICD-10-CM | POA: Diagnosis not present

## 2020-08-24 DIAGNOSIS — E782 Mixed hyperlipidemia: Secondary | ICD-10-CM | POA: Diagnosis not present

## 2020-08-24 DIAGNOSIS — I2583 Coronary atherosclerosis due to lipid rich plaque: Secondary | ICD-10-CM | POA: Diagnosis not present

## 2020-08-24 DIAGNOSIS — G4733 Obstructive sleep apnea (adult) (pediatric): Secondary | ICD-10-CM | POA: Diagnosis not present

## 2020-08-24 DIAGNOSIS — E039 Hypothyroidism, unspecified: Secondary | ICD-10-CM | POA: Diagnosis not present

## 2020-08-24 DIAGNOSIS — Z6841 Body Mass Index (BMI) 40.0 and over, adult: Secondary | ICD-10-CM | POA: Diagnosis not present

## 2020-08-24 DIAGNOSIS — Z9889 Other specified postprocedural states: Secondary | ICD-10-CM | POA: Diagnosis not present

## 2020-08-24 DIAGNOSIS — I25118 Atherosclerotic heart disease of native coronary artery with other forms of angina pectoris: Secondary | ICD-10-CM | POA: Diagnosis not present

## 2020-08-24 DIAGNOSIS — I700 Atherosclerosis of aorta: Secondary | ICD-10-CM | POA: Diagnosis not present

## 2020-08-24 DIAGNOSIS — Z951 Presence of aortocoronary bypass graft: Secondary | ICD-10-CM | POA: Diagnosis not present

## 2020-08-24 DIAGNOSIS — I2510 Atherosclerotic heart disease of native coronary artery without angina pectoris: Secondary | ICD-10-CM | POA: Diagnosis not present

## 2020-08-24 DIAGNOSIS — J90 Pleural effusion, not elsewhere classified: Secondary | ICD-10-CM | POA: Diagnosis not present

## 2020-08-24 DIAGNOSIS — J9811 Atelectasis: Secondary | ICD-10-CM | POA: Diagnosis not present

## 2020-08-24 DIAGNOSIS — I352 Nonrheumatic aortic (valve) stenosis with insufficiency: Secondary | ICD-10-CM | POA: Diagnosis not present

## 2020-08-24 DIAGNOSIS — E119 Type 2 diabetes mellitus without complications: Secondary | ICD-10-CM | POA: Diagnosis not present

## 2020-08-24 DIAGNOSIS — I10 Essential (primary) hypertension: Secondary | ICD-10-CM | POA: Diagnosis not present

## 2020-08-24 DIAGNOSIS — I130 Hypertensive heart and chronic kidney disease with heart failure and stage 1 through stage 4 chronic kidney disease, or unspecified chronic kidney disease: Secondary | ICD-10-CM | POA: Diagnosis not present

## 2020-08-24 DIAGNOSIS — Z4682 Encounter for fitting and adjustment of non-vascular catheter: Secondary | ICD-10-CM | POA: Diagnosis not present

## 2020-08-24 DIAGNOSIS — E872 Acidosis: Secondary | ICD-10-CM | POA: Diagnosis not present

## 2020-08-24 DIAGNOSIS — I350 Nonrheumatic aortic (valve) stenosis: Secondary | ICD-10-CM | POA: Diagnosis not present

## 2020-08-24 DIAGNOSIS — I5032 Chronic diastolic (congestive) heart failure: Secondary | ICD-10-CM | POA: Diagnosis not present

## 2020-08-24 DIAGNOSIS — Q231 Congenital insufficiency of aortic valve: Secondary | ICD-10-CM | POA: Diagnosis not present

## 2020-08-25 DIAGNOSIS — I700 Atherosclerosis of aorta: Secondary | ICD-10-CM | POA: Diagnosis not present

## 2020-08-25 DIAGNOSIS — I352 Nonrheumatic aortic (valve) stenosis with insufficiency: Secondary | ICD-10-CM | POA: Diagnosis not present

## 2020-08-25 DIAGNOSIS — I10 Essential (primary) hypertension: Secondary | ICD-10-CM | POA: Diagnosis not present

## 2020-08-25 DIAGNOSIS — I2583 Coronary atherosclerosis due to lipid rich plaque: Secondary | ICD-10-CM | POA: Diagnosis not present

## 2020-08-25 DIAGNOSIS — I2510 Atherosclerotic heart disease of native coronary artery without angina pectoris: Secondary | ICD-10-CM | POA: Diagnosis not present

## 2020-08-25 DIAGNOSIS — Z4682 Encounter for fitting and adjustment of non-vascular catheter: Secondary | ICD-10-CM | POA: Diagnosis not present

## 2020-08-25 DIAGNOSIS — Z952 Presence of prosthetic heart valve: Secondary | ICD-10-CM | POA: Diagnosis not present

## 2020-08-25 DIAGNOSIS — J9811 Atelectasis: Secondary | ICD-10-CM | POA: Diagnosis not present

## 2020-08-25 DIAGNOSIS — Z951 Presence of aortocoronary bypass graft: Secondary | ICD-10-CM | POA: Diagnosis not present

## 2020-08-25 DIAGNOSIS — Z9889 Other specified postprocedural states: Secondary | ICD-10-CM | POA: Diagnosis not present

## 2020-08-25 DIAGNOSIS — I350 Nonrheumatic aortic (valve) stenosis: Secondary | ICD-10-CM | POA: Diagnosis not present

## 2020-08-25 HISTORY — PX: CORONARY ARTERY BYPASS GRAFT: SHX141

## 2020-08-25 HISTORY — PX: AORTIC VALVE REPLACEMENT: SHX41

## 2020-08-25 NOTE — Procedures (Signed)
Operative Note     SURGERY DATE: 08/25/2020    PRE-OP DIAGNOSIS: CAD (coronary artery disease) [I25.10]  Aortic valve stenosis [I35.0]      POST-OP DIAGNOSIS: Post-Op Diagnosis Codes:     * CAD (coronary artery disease) [I25.10]     * Aortic valve stenosis [I35.0]      Procedure(s):  CORONARY ARTERY BYPASS GRAFTING x 2 (LIMA to LAD, RSVG to RCA) (CPT 33533, 33517)  ENDOSCOPY, SURGICAL, INCLUDING VIDEO-ASSISTED HARVEST OF VEIN(S) FOR CORONARY ARTERY BYPASS PROCEDURE  (CPT 33508)  ADULT, REPLACEMENT, AORTIC VALVE, OPEN, WITH CARDIOPULMONARY BYPASS, STERNOTOMY; WITH PROSTHETIC VALVE OTHER THAN HOMOGRAFT OR STENTLESS VALVE, 27 mm Carpentier-Edwards Inspiris bioprosthetic valve (CPT 33405)    SURGEON: Surgeon(s) and Role:     * Gaca, Reatha Armour, MD - Primary     * Leitha Bleak, MD - Fellow    STAFF: Circulator: Art Buff, RN; Eartha Inch, RN; Jerold Coombe, RN; Renato Shin, RN; Jennette Banker, RN  Perfusionist: Patsy Goodridge, LP  Physician Assistant: Tawanna Cooler, PA  Scrub Person: Floyce Stakes, RN; Alvan Dame    ANESTHESIA: General     INDICATION(S): 1) severe aortic stenosis 2) coronary artery disease    OPERATIVE FINDINGS: bicuspid aortic valve     OPERATIVE REPORT:    GRAFT MATRIX:  Distal: Graft Source:  Target Quality: Conduit Quality: Distal Suture: Prox. Suture:  Flow:  LAD LIMA  Good  Good  7-0       RCA RSVG  Good  Good  7-0  6-0     Procedure in Detail:     The risks, benefits, complications, treatment options, and expected outcomes were discussed with the patient. The possibilities of reaction to medication, pulmonary aspiration, perforation of viscus, bleeding, recurrent infection, the need for additional procedures, failure to diagnose a condition, and creating a complication requiring transfusion or operation were discussed with the patient. The patient concurred with the proposed plan, giving informed consent. The patient was taken to Operating Room # 55,  identified as David Dickerson and the procedure verified as Coronary Artery Bypass Grafting / Aortic Valve Replacement. A Time Out was held and the above information confirmed.    Standard monitoring lines and Foley catheter were placed. General anesthesia was induced. The patient was prepped and draped in a sterile fashion. An endoscopic vein harvesting in the left leg was carried out by [employer] PA. A standard median sternotomy was performed. The left internal mammary artery was taken down using cautery and clips.  Intervenous heparin was given at the appropriate dose to obtain anticoagulation sufficient for CPB. The LIMA was then transected distally. The flow was excellent.  The pericardium was then opened and the pericardial well was created. Concentric purse strings were placed and the aorta was cannulated with a 73fr EOPA cannula. After adequate de-airing this cannula was connected to the CPB circuit.  A purse string was placed around the right atrial appendage and it was cannulated with a dual stage venous cannula. Prior to initiating CPB the LIMA was fashioned to the appropriate length.     Cardiopulmonary bypass was then initiated. The distal targets were then evaluated and marked. The heart basket was then placed into the appropriate position. An antegrade vent/cardioplegia cannula was then placed. The aortic cross clamp was then applied and the heart was arrested with 1200 cc antegrade cardioplegia. The distal anastomosis to the RCA was completed first. After postioniong the heart, the artery was opened and RSV was  ananstomosed to the artery using 7-0 prolene in a running fashion. Prior to completing the anastomosis it was probed proximally and distally to ensure patency. Next the LIMA to LAD anastomosis was completed in a similar fashion with 7-0 prolene. Prior to completing the anastomosis it was probed to ensure patency. After the distal anastomoses were complete, the ascending aorta was opened.  The aortic valve was carefully inspected with findings as outlined above.  The native aortic valve was excised.  A 27 mm Carpentier-Edwards Inspiris bioprosthetic valve was chosen.  Pledgeted valve sutures were utilized after copious irrigation of the aortic root and ascending aorta.  These were horizontal mattress sutures of 2-0 Ethibond with sub-annular pledgets.  The valve was then seated onto the aortic annulus.  All the knots were then tied and the fit was quite satisfactory. The aorta was closed in two layers with running 4-0 prolene sutures. The proximal anastomosis was then completed in the standard fashion using 6-0 prolene in a running fashion. After adequate deairing maneuvers, the cross clamp was removed and the heart was reperfused.     Examination of all surgical sites revealed good hemostasis. Atrial and ventricular epicardial pacing wires were then placed. The patient was fully ventillated and successfully weaned off of CPB. After a test dose of protamine, the patient was fully decannulated.  The heparin was fully reversed with protamine. Again, examination of all surgical sites were evaluated for hemostasis which was good.    Post-bypass TEE revealed preserved RV/LV function, normal bioprosthetic valve function, and no new wall motion abnormalities.       Left pleural as well as mediastinal drains were placed. The sternum was closed using stainless steel wires and the plates. The fascia was closed with 2-0 Maxon and the deep dermal with 2-0 Vicryl. The skin was closed with surgical sutures. A sterile dressing was applied over the incision.    At the end of the operation, all sponge, instruments, and needle counts were correct.     ESTIMATED BLOOD LOSS: minimal     * No values recorded between 08/25/2020  8:06 AM and 08/25/2020 12:37 PM *      DRAINS:   Chest Tube Bundle 1 Atrium Mediastinal;Soft;Straight Mediastinal;Soft;Straight Pleural;Left;Soft;Straight (Active)   Dressing Type Gauze;Transparent  08/25/20 1217   Dressing Status/ Intervention Clean, Dry, Intact 08/25/20 1217       Urethral Catheter Latex;Straight-tip;Temperature probe (Active)   Reason for Continuing Urinary Catheterization  Perioperative use or within 48hrs post-op 08/25/20 1217   Securement Method Securement Device 08/25/20 1217   Site Assessment Clean, Dry, Intact 08/25/20 1217   Collection Container Standard drainage bag 08/25/20 1217       TOTAL IV FLUIDS: NA    SPECIMENS:   ID Type Source Tests Collected by Time Destination   1 : Aortic Valve Tissue-Pathology Heart Valve PATHOLOGY - GENERAL / OTHER Collene SchlichterGaca, Jeffrey Giles, MD 08/25/2020 1001         IMPLANTS:   Implant Name Type Inv. Item Serial No. Manufacturer Lot No. LRB No. Used Action   MARKER, GRAFT CORONARY VEIN 10MM - [id number]  MARKER, GRAFT CORONARY VEIN 10MM  [employer]   1 Implanted   VALVE, AORTIC INSPIRIS RESILIA 27MM - [id number]  VALVE, AORTIC INSPIRIS RESILIA 27MM [id number] [employer]   1 Implanted   SYSTEM, STERNALOCK 360 MULTI BLU ST - SN/A  SYSTEM, STERNALOCK 360 MULTI BLU ST N/A BIOMET/MICROFIXATION [id number]  1 Implanted   SCREW, LOCKING CANCELLOUS 2.4X16MM - [id number]  SCREW, LOCKING CANCELLOUS 2.4X16MM  BIOMET/MICROFIXATION  N/A 12 Implanted   SCREW, LOCKING CANCELLOUS 2.4X18MM - [id number]  SCREW, LOCKING CANCELLOUS 2.4X18MM  BIOMET/MICROFIXATION  N/A 4 Implanted        COMPLICATIONS: none    DISPOSITION: ICU - intubated and hemodynamically stable.    ATTESTATION:   FR- Surgery - (Entire alt) - I was present throughout the entire procedure as documented in my operative note (FR)

## 2020-08-26 DIAGNOSIS — J90 Pleural effusion, not elsewhere classified: Secondary | ICD-10-CM | POA: Diagnosis not present

## 2020-08-26 DIAGNOSIS — I2510 Atherosclerotic heart disease of native coronary artery without angina pectoris: Secondary | ICD-10-CM | POA: Diagnosis not present

## 2020-08-26 DIAGNOSIS — J9811 Atelectasis: Secondary | ICD-10-CM | POA: Diagnosis not present

## 2020-08-26 DIAGNOSIS — Z952 Presence of prosthetic heart valve: Secondary | ICD-10-CM | POA: Insufficient documentation

## 2020-08-26 DIAGNOSIS — Z951 Presence of aortocoronary bypass graft: Secondary | ICD-10-CM | POA: Insufficient documentation

## 2020-08-26 DIAGNOSIS — I350 Nonrheumatic aortic (valve) stenosis: Secondary | ICD-10-CM | POA: Diagnosis not present

## 2020-08-26 DIAGNOSIS — Z9889 Other specified postprocedural states: Secondary | ICD-10-CM | POA: Diagnosis not present

## 2020-08-26 DIAGNOSIS — Z4682 Encounter for fitting and adjustment of non-vascular catheter: Secondary | ICD-10-CM | POA: Diagnosis not present

## 2020-08-27 DIAGNOSIS — Z9889 Other specified postprocedural states: Secondary | ICD-10-CM | POA: Diagnosis not present

## 2020-08-27 DIAGNOSIS — J9811 Atelectasis: Secondary | ICD-10-CM | POA: Diagnosis not present

## 2020-08-27 DIAGNOSIS — Z951 Presence of aortocoronary bypass graft: Secondary | ICD-10-CM | POA: Diagnosis not present

## 2020-08-27 DIAGNOSIS — J90 Pleural effusion, not elsewhere classified: Secondary | ICD-10-CM | POA: Diagnosis not present

## 2020-08-27 DIAGNOSIS — R918 Other nonspecific abnormal finding of lung field: Secondary | ICD-10-CM | POA: Diagnosis not present

## 2020-08-28 DIAGNOSIS — Z9889 Other specified postprocedural states: Secondary | ICD-10-CM | POA: Diagnosis not present

## 2020-08-28 DIAGNOSIS — Z952 Presence of prosthetic heart valve: Secondary | ICD-10-CM | POA: Diagnosis not present

## 2020-08-28 DIAGNOSIS — J90 Pleural effusion, not elsewhere classified: Secondary | ICD-10-CM | POA: Diagnosis not present

## 2020-08-28 DIAGNOSIS — R5381 Other malaise: Secondary | ICD-10-CM | POA: Insufficient documentation

## 2020-08-28 DIAGNOSIS — J9811 Atelectasis: Secondary | ICD-10-CM | POA: Diagnosis not present

## 2020-08-29 DIAGNOSIS — J9811 Atelectasis: Secondary | ICD-10-CM | POA: Diagnosis not present

## 2020-08-29 DIAGNOSIS — I5032 Chronic diastolic (congestive) heart failure: Secondary | ICD-10-CM | POA: Diagnosis not present

## 2020-08-30 DIAGNOSIS — J9811 Atelectasis: Secondary | ICD-10-CM | POA: Diagnosis not present

## 2020-08-30 DIAGNOSIS — Z952 Presence of prosthetic heart valve: Secondary | ICD-10-CM | POA: Diagnosis not present

## 2020-09-14 DIAGNOSIS — J9811 Atelectasis: Secondary | ICD-10-CM | POA: Diagnosis not present

## 2020-09-14 DIAGNOSIS — R918 Other nonspecific abnormal finding of lung field: Secondary | ICD-10-CM | POA: Diagnosis not present

## 2020-09-14 DIAGNOSIS — Z951 Presence of aortocoronary bypass graft: Secondary | ICD-10-CM | POA: Diagnosis not present

## 2020-09-14 DIAGNOSIS — Z48812 Encounter for surgical aftercare following surgery on the circulatory system: Secondary | ICD-10-CM | POA: Diagnosis not present

## 2020-09-14 DIAGNOSIS — Z7982 Long term (current) use of aspirin: Secondary | ICD-10-CM | POA: Diagnosis not present

## 2020-09-14 DIAGNOSIS — I517 Cardiomegaly: Secondary | ICD-10-CM | POA: Diagnosis not present

## 2020-09-14 DIAGNOSIS — I440 Atrioventricular block, first degree: Secondary | ICD-10-CM | POA: Diagnosis not present

## 2020-09-14 DIAGNOSIS — Q2546 Tortuous aortic arch: Secondary | ICD-10-CM | POA: Diagnosis not present

## 2020-09-14 DIAGNOSIS — R9431 Abnormal electrocardiogram [ECG] [EKG]: Secondary | ICD-10-CM | POA: Diagnosis not present

## 2020-10-09 DIAGNOSIS — I639 Cerebral infarction, unspecified: Secondary | ICD-10-CM | POA: Diagnosis not present

## 2020-10-09 DIAGNOSIS — I10 Essential (primary) hypertension: Secondary | ICD-10-CM | POA: Diagnosis not present

## 2020-10-09 DIAGNOSIS — I5032 Chronic diastolic (congestive) heart failure: Secondary | ICD-10-CM | POA: Diagnosis not present

## 2020-10-09 DIAGNOSIS — I2510 Atherosclerotic heart disease of native coronary artery without angina pectoris: Secondary | ICD-10-CM | POA: Diagnosis not present

## 2020-10-09 DIAGNOSIS — I2583 Coronary atherosclerosis due to lipid rich plaque: Secondary | ICD-10-CM | POA: Diagnosis not present

## 2020-10-19 ENCOUNTER — Encounter: Payer: Self-pay | Admitting: *Deleted

## 2020-10-19 ENCOUNTER — Encounter: Payer: Medicare HMO | Attending: Cardiology | Admitting: *Deleted

## 2020-10-19 ENCOUNTER — Other Ambulatory Visit: Payer: Self-pay

## 2020-10-19 DIAGNOSIS — Z87891 Personal history of nicotine dependence: Secondary | ICD-10-CM | POA: Insufficient documentation

## 2020-10-19 DIAGNOSIS — Z952 Presence of prosthetic heart valve: Secondary | ICD-10-CM

## 2020-10-19 DIAGNOSIS — Z951 Presence of aortocoronary bypass graft: Secondary | ICD-10-CM | POA: Insufficient documentation

## 2020-10-19 NOTE — Progress Notes (Signed)
Virtual orientation call completed today. he has an appointment on Date: 10/26/2020 for EP eval and gym Orientation.  Documentation of diagnosis can be found in [facility] Date: [id number].

## 2020-10-26 ENCOUNTER — Other Ambulatory Visit: Payer: Self-pay

## 2020-10-26 VITALS — Ht 68.25 in | Wt 294.8 lb

## 2020-10-26 DIAGNOSIS — Z952 Presence of prosthetic heart valve: Secondary | ICD-10-CM

## 2020-10-26 DIAGNOSIS — Z951 Presence of aortocoronary bypass graft: Secondary | ICD-10-CM

## 2020-10-26 DIAGNOSIS — Z87891 Personal history of nicotine dependence: Secondary | ICD-10-CM | POA: Diagnosis not present

## 2020-10-26 NOTE — Progress Notes (Signed)
Cardiac Individual Treatment Plan  Patient Details  Name: Derrick Mckenzie MRN: [id number] Date of Birth: 05/07/1951 Referring Provider:     Cardiac Rehab from 10/26/2020 in [facility] Cardiac and Pulmonary Rehab  Referring Provider Fath      Initial Encounter Date:    Cardiac Rehab from 10/26/2020 in [facility] Cardiac and Pulmonary Rehab  Date 10/26/20      Visit Diagnosis: S/P CABG x 2  S/P AVR (aortic valve replacement)  Patient's Home Medications on Admission:  Current Outpatient Medications:  .  albuterol (PROVENTIL HFA;VENTOLIN HFA) 108 (90 Base) MCG/ACT inhaler, Inhale 2 puffs into the lungs every 6 (six) hours as needed for wheezing or shortness of breath., Disp: 1 Inhaler, Rfl: 2 .  amLODipine (NORVASC) 5 MG tablet, Take 5 mg by mouth daily. (Patient not taking: Reported on 10/19/2020), Disp: , Rfl:  .  aspirin EC 81 MG tablet, Take 81 mg by mouth daily. Swallow whole., Disp: , Rfl:  .  atorvastatin (LIPITOR) 80 MG tablet, , Disp: , Rfl:  .  clopidogrel (PLAVIX) 75 MG tablet, Take 1 tablet (75 mg total) by mouth daily., Disp: 30 tablet, Rfl: 0 .  Fluticasone-Salmeterol (ADVAIR) 100-50 MCG/DOSE AEPB, Inhale 1 puff into the lungs 2 (two) times daily. (Patient not taking: Reported on 05/08/2020), Disp: , Rfl:  .  furosemide (LASIX) 40 MG tablet, Take 40 mg by mouth daily. , Disp: , Rfl:  .  Krill Oil 1000 MG CAPS, Take 2,000 mg by mouth 3 (three) times daily., Disp: , Rfl:  .  levothyroxine (SYNTHROID) 100 MCG tablet, Take 100 mcg by mouth daily., Disp: , Rfl:  .  metFORMIN (GLUCOPHAGE-XR) 500 MG 24 hr tablet, Take 500 mg by mouth in the morning and at bedtime., Disp: , Rfl:  .  Multiple Vitamin (MULTIVITAMIN WITH MINERALS) TABS tablet, Take 1 tablet by mouth daily. (Patient not taking: Reported on 05/08/2020), Disp: , Rfl:  .  omega-3 acid ethyl esters (LOVAZA) 1 g capsule, Take 2 g by mouth daily.  (Patient not taking: Reported on 10/19/2020), Disp: , Rfl:  .  traZODone (DESYREL) 50 MG  tablet, Take by mouth., Disp: , Rfl:   Past Medical History: Past Medical History:  Diagnosis Date  . Arthritis    knees  . Complication of anesthesia    aggitation after knee surgery  . Family history of adverse reaction to anesthesia    brother - hallucinations  . GERD (gastroesophageal reflux disease)   . Hx of smoking 06/26/2015  . Hyperlipidemia   . Hypertension   . Hypothyroidism   . Kidney stone   . Morbid obesity ([place])   . Murmur   . Prediabetes 10/14/2016  . Sleep apnea    score of 5 on apnea screen    Tobacco Use: Social History   Tobacco Use  Smoking Status Former Smoker  . Packs/day: 0.50  . Years: 10.00  . Pack years: 5.00  . Types: Cigarettes  . Quit date: 02/03/2016  . Years since quitting: 4.7  Smokeless Tobacco Never Used  Tobacco Comment   Quit in 2017    Labs: Recent Review Flowsheet Data    Labs for ITP Cardiac and Pulmonary Rehab Latest Ref Rng & Units 08/05/2016 09/27/2016 02/13/2017 02/26/2020 02/27/2020   Cholestrol 0 - 200 mg/dL - 182 171 - 137   LDLCALC 0 - 99 mg/dL - 95 88 - 62   HDL >40 mg/dL - 29(L) 29(L) - 28(L)   Trlycerides <150 mg/dL - 291(H) 269(H) -  237(H)   Hemoglobin A1c 4.8 - 5.6 % 5.8(H) - 5.6 6.3(H) 6.2(H)       Exercise Target Goals: Exercise Program Goal: Individual exercise prescription set using results from initial 6 min walk test and THRR while considering  patient's activity barriers and safety.   Exercise Prescription Goal: Initial exercise prescription builds to 30-45 minutes a day of aerobic activity, 2-3 days per week.  Home exercise guidelines will be given to patient during program as part of exercise prescription that the participant will acknowledge.   Education: Aerobic Exercise: - Group verbal and visual presentation on the components of exercise prescription. Introduces F.I.T.T principle from ACSM for exercise prescriptions.  Reviews F.I.T.T. principles of aerobic exercise including progression. Written  material given at graduation.   Education: Resistance Exercise: - Group verbal and visual presentation on the components of exercise prescription. Introduces F.I.T.T principle from ACSM for exercise prescriptions  Reviews F.I.T.T. principles of resistance exercise including progression. Written material given at graduation.    Education: Exercise & Equipment Safety: - Individual verbal instruction and demonstration of equipment use and safety with use of the equipment.   Cardiac Rehab from 10/26/2020 in [facility] Cardiac and Pulmonary Rehab  Date 10/26/20  Educator AS  Instruction Review Code 1- Verbalizes Understanding      Education: Exercise Physiology & General Exercise Guidelines: - Group verbal and written instruction with models to review the exercise physiology of the cardiovascular system and associated critical values. Provides general exercise guidelines with specific guidelines to those with heart or lung disease.    Education: Flexibility, Balance, Mind/Body Relaxation: - Group verbal and visual presentation with interactive activity on the components of exercise prescription. Introduces F.I.T.T principle from ACSM for exercise prescriptions. Reviews F.I.T.T. principles of flexibility and balance exercise training including progression. Also discusses the mind body connection.  Reviews various relaxation techniques to help reduce and manage stress (i.e. Deep breathing, progressive muscle relaxation, and visualization). Balance handout provided to take home. Written material given at graduation.   Activity Barriers & Risk Stratification:  Activity Barriers & Cardiac Risk Stratification - 10/19/20 1114      Activity Barriers & Cardiac Risk Stratification   Activity Barriers None    Cardiac Risk Stratification High           6 Minute Walk:  6 Minute Walk    Row Name 10/26/20 1035         6 Minute Walk   Phase Initial     Distance 1205 feet     Walk Time 6 minutes      # of Rest Breaks 0     MPH 2.28     METS 2.6     RPE 10     Perceived Dyspnea  1     VO2 Peak 9.2     Symptoms No     Resting HR 77 bpm     Resting BP 152/78     Resting Oxygen Saturation  94 %     Exercise Oxygen Saturation  during 6 min walk 93 %     Max Ex. HR 137 bpm     Max Ex. BP 188/86     2 Minute Post BP 150/88            Oxygen Initial Assessment:   Oxygen Re-Evaluation:   Oxygen Discharge (Final Oxygen Re-Evaluation):   Initial Exercise Prescription:  Initial Exercise Prescription - 10/26/20 1000      Date of Initial Exercise RX and Referring  Provider   Date 10/26/20    Referring Provider Fath      Treadmill   MPH 2    Grade 0.5    Minutes 15    METs 2.6      Elliptical   Level 1    Speed 3    Minutes 15    METs 2.6      REL-XR   Level 1    Speed 50    Minutes 15    METs 2.6      T5 Nustep   Level 2    SPM 80    Minutes 15    METs 2.6      Prescription Details   Frequency (times per week) 3    Duration Progress to 30 minutes of continuous aerobic without signs/symptoms of physical distress      Intensity   THRR 40-80% of Max Heartrate 105-135    Ratings of Perceived Exertion 11-13    Perceived Dyspnea 0-4      Resistance Training   Training Prescription Yes    Weight 3 lb    Reps 10-15           Perform Capillary Blood Glucose checks as needed.  Exercise Prescription Changes:   Exercise Comments:   Exercise Goals and Review:   Exercise Goals Re-Evaluation :   Discharge Exercise Prescription (Final Exercise Prescription Changes):   Nutrition:  Target Goals: Understanding of nutrition guidelines, daily intake of sodium 1500mg , cholesterol 200mg , calories 30% from fat and 7% or less from saturated fats, daily to have 5 or more servings of fruits and vegetables.  Education: All About Nutrition: -Group instruction provided by verbal, written material, interactive activities, discussions, models, and posters to  present general guidelines for heart healthy nutrition including fat, fiber, MyPlate, the role of sodium in heart healthy nutrition, utilization of the nutrition label, and utilization of this knowledge for meal planning. Follow up email sent as well. Written material given at graduation.   Biometrics:    Nutrition Therapy Plan and Nutrition Goals:   Nutrition Assessments:  MEDIFICTS Score Key:  ?70 Need to make dietary changes   40-70 Heart Healthy Diet  ? 40 Therapeutic Level Cholesterol Diet    Cardiac Rehab from 10/26/2020 in [facility] Cardiac and Pulmonary Rehab  Picture Your Plate Total Score on Admission 50     Picture Your Plate Scores:  <53 Unhealthy dietary pattern with much room for improvement.  41-50 Dietary pattern unlikely to meet recommendations for good health and room for improvement.  51-60 More healthful dietary pattern, with some room for improvement.   >60 Healthy dietary pattern, although there may be some specific behaviors that could be improved.    Nutrition Goals Re-Evaluation:   Nutrition Goals Discharge (Final Nutrition Goals Re-Evaluation):   Psychosocial: Target Goals: Acknowledge presence or absence of significant depression and/or stress, maximize coping skills, provide positive support system. Participant is able to verbalize types and ability to use techniques and skills needed for reducing stress and depression.   Education: Stress, Anxiety, and Depression - Group verbal and visual presentation to define topics covered.  Reviews how body is impacted by stress, anxiety, and depression.  Also discusses healthy ways to reduce stress and to treat/manage anxiety and depression.  Written material given at graduation.   Education: Sleep Hygiene -Provides group verbal and written instruction about how sleep can affect your health.  Define sleep hygiene, discuss sleep cycles and impact of sleep habits. Review good sleep  hygiene tips.     Initial Review & Psychosocial Screening:  Initial Psych Review & Screening - 10/19/20 1115      Initial Review   Current issues with Current Sleep Concerns   Since daughter passed away in 08/15/2020 have not been sleeping well since. Has PRN med to help sleep..     Family Dynamics   Concerns Recent loss of child    Comments Daughter passed away in 08/15/2020,      Barriers   Psychosocial barriers to participate in program There are no identifiable barriers or psychosocial needs.;The patient should benefit from training in stress management and relaxation.      Screening Interventions   Interventions Encouraged to exercise;To provide support and resources with identified psychosocial needs;Provide feedback about the scores to participant    Expected Outcomes Short Term goal: Utilizing psychosocial counselor, staff and physician to assist with identification of specific Stressors or current issues interfering with healing process. Setting desired goal for each stressor or current issue identified.;Long Term Goal: Stressors or current issues are controlled or eliminated.;Short Term goal: Identification and review with participant of any Quality of Life or Depression concerns found by scoring the questionnaire.;Long Term goal: The participant improves quality of Life and PHQ9 Scores as seen by post scores and/or verbalization of changes           Quality of Life Scores:   Scores of 19 and below usually indicate a poorer quality of life in these areas.  A difference of  2-3 points is a clinically meaningful difference.  A difference of 2-3 points in the total score of the Quality of Life Index has been associated with significant improvement in overall quality of life, self-image, physical symptoms, and general health in studies assessing change in quality of life.  PHQ-9: Recent Review Flowsheet Data    Depression screen [facility] 2/9 10/26/2020 03/16/2017 02/13/2017 10/14/2016 09/27/2016    Decreased Interest 2 0 0 0 0   Down, Depressed, Hopeless 0 0 0 0 0   PHQ - 2 Score 2 0 0 0 0   Altered sleeping 2  - - - -   Tired, decreased energy 3  - - - -   Change in appetite 3  - - - -   Feeling bad or failure about yourself  0 - - - -   Trouble concentrating 2 - - - -   Moving slowly or fidgety/restless 0 - - - -   Suicidal thoughts 0 - - - -   PHQ-9 Score 12 - - - -   Difficult doing work/chores Somewhat difficult - - - -     Interpretation of Total Score  Total Score Depression Severity:  1-4 = Minimal depression, 5-9 = Mild depression, 10-14 = Moderate depression, 15-19 = Moderately severe depression, 20-27 = Severe depression   Psychosocial Evaluation and Intervention:  Psychosocial Evaluation - 10/19/20 1136      Psychosocial Evaluation & Interventions   Interventions Encouraged to exercise with the program and follow exercise prescription    Comments Derrick Mckenzie is ready to start the program, he has no barriers to starting. He is ready to get back to his golf game as soon as possible. He has not exercised in some time and is overweight. He does want to lose weight and gain strength to get back to his activities. Derrick Mckenzie lives with his wife in their home. He has a son in [place] and two step children, one lives in  [place] the other in [place]. Derrick Mckenzie 's daughter  ,71 years old, passed away in past 08/08/2023. He has not been sleeping well since then. He does have a PRN med to help for sleep.  Derrick Mckenzie stated that he had gone to visit some family and the night he returned home, he slept well. He will watch and see if this continues. He should do well with the program. He has the desire to get well and get back to his golf game and lose some weight .    Expected Outcomes STG: Derrick Mckenzie attends all scheduled sessions to be able to learn the steps to weight loss and to get stronger to get back to his golf game. LTG: Derrick Mckenzie is able to continue with his progress from program with his weight loss and getting back to his golf game.     Continue Psychosocial Services  Follow up required by staff           Psychosocial Re-Evaluation:   Psychosocial Discharge (Final Psychosocial Re-Evaluation):   Vocational Rehabilitation: Provide vocational rehab assistance to qualifying candidates.   Vocational Rehab Evaluation & Intervention:  Vocational Rehab - 10/19/20 1125      Initial Vocational Rehab Evaluation & Intervention   Assessment shows need for Vocational Rehabilitation No           Education: Education Goals: Education classes will be provided on a variety of topics geared toward better understanding of heart health and risk factor modification. Participant will state understanding/return demonstration of topics presented as noted by education test scores.  Learning Barriers/Preferences:  Learning Barriers/Preferences - 10/19/20 1123      Learning Barriers/Preferences   Learning Barriers None    Learning Preferences Skilled Demonstration;Verbal Instruction           General Cardiac Education Topics:  AED/CPR: - Group verbal and written instruction with the use of models to demonstrate the basic use of the AED with the basic ABC's of resuscitation.   Anatomy and Cardiac Procedures: - Group verbal and visual presentation and models provide information about basic cardiac anatomy and function. Reviews the testing methods done to diagnose heart disease and the outcomes of the test results. Describes the treatment choices: Medical Management, Angioplasty, or Coronary Bypass Surgery for treating various heart conditions including Myocardial Infarction, Angina, Valve Disease, and Cardiac Arrhythmias.  Written material given at graduation.   Medication Safety: - Group verbal and visual instruction to review commonly prescribed medications for heart and lung disease. Reviews the medication, class of the drug, and side effects. Includes the steps to properly store meds and maintain the prescription regimen.   Written material given at graduation.   Intimacy: - Group verbal instruction through game format to discuss how heart and lung disease can affect sexual intimacy. Written material given at graduation..   Know Your Numbers and Heart Failure: - Group verbal and visual instruction to discuss disease risk factors for cardiac and pulmonary disease and treatment options.  Reviews associated critical values for Overweight/Obesity, Hypertension, Cholesterol, and Diabetes.  Discusses basics of heart failure: signs/symptoms and treatments.  Introduces Heart Failure Zone chart for action plan for heart failure.  Written material given at graduation.   Infection Prevention: - Provides verbal and written material to individual with discussion of infection control including proper hand washing and proper equipment cleaning during exercise session.   Cardiac Rehab from 10/26/2020 in [facility] Cardiac and Pulmonary Rehab  Date 10/26/20  Educator AS  Instruction Review Code 1- Verbalizes Understanding  Falls Prevention: - Provides verbal and written material to individual with discussion of falls prevention and safety.   Cardiac Rehab from 10/26/2020 in [facility] Cardiac and Pulmonary Rehab  Date 10/26/20  Educator AS  Instruction Review Code 1- Verbalizes Understanding      Other: -Provides group and verbal instruction on various topics (see comments)   Knowledge Questionnaire Score:   Core Components/Risk Factors/Patient Goals at Admission:  Personal Goals and Risk Factors at Admission - 10/26/20 1046      Core Components/Risk Factors/Patient Goals on Admission    Weight Management Yes;Weight Loss    Intervention Weight Management: Develop a combined nutrition and exercise program designed to reach desired caloric intake, while maintaining appropriate intake of nutrient and fiber, sodium and fats, and appropriate energy expenditure required for the weight goal.;Weight Management: Provide  education and appropriate resources to help participant work on and attain dietary goals.;Obesity: Provide education and appropriate resources to help participant work on and attain dietary goals.    Admit Weight 291 lb (132 kg)    Goal Weight: Short Term 288 lb (130.6 kg)    Goal Weight: Long Term 230 lb (104.3 kg)    Expected Outcomes Short Term: Continue to assess and modify interventions until short term weight is achieved;Long Term: Adherence to nutrition and physical activity/exercise program aimed toward attainment of established weight goal;Weight Loss: Understanding of general recommendations for a balanced deficit meal plan, which promotes 1-2 lb weight loss per week and includes a negative energy balance of [phone] kcal/d    Diabetes Yes    Intervention Provide education about signs/symptoms and action to take for hypo/hyperglycemia.;Provide education about proper nutrition, including hydration, and aerobic/resistive exercise prescription along with prescribed medications to achieve blood glucose in normal ranges: Fasting glucose 65-99 mg/dL    Expected Outcomes Short Term: Participant verbalizes understanding of the signs/symptoms and immediate care of hyper/hypoglycemia, proper foot care and importance of medication, aerobic/resistive exercise and nutrition plan for blood glucose control.;Long Term: Attainment of HbA1C < 7%.    Lipids Yes    Intervention Provide education and support for participant on nutrition & aerobic/resistive exercise along with prescribed medications to achieve LDL 70mg , HDL >40mg .    Expected Outcomes Short Term: Participant states understanding of desired cholesterol values and is compliant with medications prescribed. Participant is following exercise prescription and nutrition guidelines.;Long Term: Cholesterol controlled with medications as prescribed, with individualized exercise RX and with personalized nutrition plan. Value goals: LDL < 70mg , HDL > 40 mg.             Education:Diabetes - Individual verbal and written instruction to review signs/symptoms of diabetes, desired ranges of glucose level fasting, after meals and with exercise. Acknowledge that pre and post exercise glucose checks will be done for 3 sessions at entry of program.   Core Components/Risk Factors/Patient Goals Review:    Core Components/Risk Factors/Patient Goals at Discharge (Final Review):    ITP Comments:  ITP Comments    Row Name 10/19/20 1132           ITP Comments Virtual orientation call completed today. he has an appointment on Date: 10/26/2020 for EP eval and gym Orientation.  Documentation of diagnosis [facility] be found in [facility] Date: [id number].              Comments: initial ITP

## 2020-10-26 NOTE — Patient Instructions (Addendum)
Patient Instructions  Patient Details  Name: Derrick Mckenzie MRN: [id number] Date of Birth: [birth year] Referring Provider:  Teodoro Spray, MD  Below are your personal goals for exercise, nutrition, and risk factors. Our goal is to help you stay on track towards obtaining and maintaining these goals. We will be discussing your progress on these goals with you throughout the program.  Initial Exercise Prescription:  Initial Exercise Prescription - 10/26/20 1000      Date of Initial Exercise RX and Referring Provider   Date 10/26/20    Referring Provider Fath      Treadmill   MPH 2    Grade 0.5    Minutes 15    METs 2.6      Elliptical   Level 1    Speed 3    Minutes 15    METs 2.6      REL-XR   Level 1    Speed 50    Minutes 15    METs 2.6      T5 Nustep   Level 2    SPM 80    Minutes 15    METs 2.6      Prescription Details   Frequency (times per week) 3    Duration Progress to 30 minutes of continuous aerobic without signs/symptoms of physical distress      Intensity   THRR 40-80% of Max Heartrate 105-135    Ratings of Perceived Exertion 11-13    Perceived Dyspnea 0-4      Resistance Training   Training Prescription Yes    Weight 3 lb    Reps 10-15           Exercise Goals: Frequency: Be able to perform aerobic exercise two to three times per week in program working toward 2-5 days per week of home exercise.  Intensity: Work with a perceived exertion of 11 (fairly light) - 15 (hard) while following your exercise prescription.  We will make changes to your prescription with you as you progress through the program.   Duration: Be able to do 30 to 45 minutes of continuous aerobic exercise in addition to a 5 minute warm-up and a 5 minute cool-down routine.   Nutrition Goals: Your personal nutrition goals will be established when you do your nutrition analysis with the dietician.  The following are general nutrition guidelines to follow: Cholesterol  < 200mg /day Sodium < 1500mg /day Fiber: Men over 50 yrs - 30 grams per day  Personal Goals:  Personal Goals and Risk Factors at Admission - 10/26/20 1046      Core Components/Risk Factors/Patient Goals on Admission    Weight Management Yes;Weight Loss    Intervention Weight Management: Develop a combined nutrition and exercise program designed to reach desired caloric intake, while maintaining appropriate intake of nutrient and fiber, sodium and fats, and appropriate energy expenditure required for the weight goal.;Weight Management: Provide education and appropriate resources to help participant work on and attain dietary goals.;Obesity: Provide education and appropriate resources to help participant work on and attain dietary goals.    Admit Weight 291 lb (132 kg)    Goal Weight: Short Term 288 lb (130.6 kg)    Goal Weight: Long Term 230 lb (104.3 kg)    Expected Outcomes Short Term: Continue to assess and modify interventions until short term weight is achieved;Long Term: Adherence to nutrition and physical activity/exercise program aimed toward attainment of established weight goal;Weight Loss: Understanding of general recommendations for a balanced deficit meal  plan, which promotes 1-2 lb weight loss per week and includes a negative energy balance of [phone] kcal/d    Diabetes Yes    Intervention Provide education about signs/symptoms and action to take for hypo/hyperglycemia.;Provide education about proper nutrition, including hydration, and aerobic/resistive exercise prescription along with prescribed medications to achieve blood glucose in normal ranges: Fasting glucose 65-99 mg/dL    Expected Outcomes Short Term: Participant verbalizes understanding of the signs/symptoms and immediate care of hyper/hypoglycemia, proper foot care and importance of medication, aerobic/resistive exercise and nutrition plan for blood glucose control.;Long Term: Attainment of HbA1C < 7%.    Lipids Yes     Intervention Provide education and support for participant on nutrition & aerobic/resistive exercise along with prescribed medications to achieve LDL 70mg , HDL >40mg .    Expected Outcomes Short Term: Participant states understanding of desired cholesterol values and is compliant with medications prescribed. Participant is following exercise prescription and nutrition guidelines.;Long Term: Cholesterol controlled with medications as prescribed, with individualized exercise RX and with personalized nutrition plan. Value goals: LDL < 70mg , HDL > 40 mg.           Tobacco Use Initial Evaluation: Social History   Tobacco Use  Smoking Status Former Smoker  . Packs/day: 0.50  . Years: 10.00  . Pack years: 5.00  . Types: Cigarettes  . Quit date: 02/03/2016  . Years since quitting: 4.7  Smokeless Tobacco Never Used  Tobacco Comment   Quit in 2017    Exercise Goals and Review:  Exercise Goals    Row Name 10/26/20 1059             Exercise Goals   Increase Physical Activity Yes       Intervention Provide advice, education, support and counseling about physical activity/exercise needs.;Develop an individualized exercise prescription for aerobic and resistive training based on initial evaluation findings, risk stratification, comorbidities and participant's personal goals.       Expected Outcomes Short Term: Attend rehab on a regular basis to increase amount of physical activity.;Long Term: Add in home exercise to make exercise part of routine and to increase amount of physical activity.;Long Term: Exercising regularly at least 3-5 days a week.       Increase Strength and Stamina Yes       Intervention Provide advice, education, support and counseling about physical activity/exercise needs.;Develop an individualized exercise prescription for aerobic and resistive training based on initial evaluation findings, risk stratification, comorbidities and participant's personal goals.       Expected  Outcomes Short Term: Increase workloads from initial exercise prescription for resistance, speed, and METs.;Short Term: Perform resistance training exercises routinely during rehab and add in resistance training at home;Long Term: Improve cardiorespiratory fitness, muscular endurance and strength as measured by increased METs and functional capacity (6MWT)       Able to understand and use rate of perceived exertion (RPE) scale Yes       Intervention Provide education and explanation on how to use RPE scale       Expected Outcomes Short Term: Able to use RPE daily in rehab to express subjective intensity level;Long Term:  Able to use RPE to guide intensity level when exercising independently       Able to understand and use Dyspnea scale Yes       Intervention Provide education and explanation on how to use Dyspnea scale       Expected Outcomes Short Term: Able to use Dyspnea scale daily in rehab to express  subjective sense of shortness of breath during exertion;Long Term: Able to use Dyspnea scale to guide intensity level when exercising independently       Knowledge and understanding of Target Heart Rate Range (THRR) Yes       Intervention Provide education and explanation of THRR including how the numbers were predicted and where they are located for reference       Expected Outcomes Short Term: Able to state/look up THRR;Short Term: Able to use daily as guideline for intensity in rehab;Long Term: Able to use THRR to govern intensity when exercising independently       Able to check pulse independently Yes       Intervention Provide education and demonstration on how to check pulse in carotid and radial arteries.;Review the importance of being able to check your own pulse for safety during independent exercise       Expected Outcomes Short Term: Able to explain why pulse checking is important during independent exercise;Long Term: Able to check pulse independently and accurately       Understanding of  Exercise Prescription Yes       Intervention Provide education, explanation, and written materials on patient's individual exercise prescription       Expected Outcomes Short Term: Able to explain program exercise prescription;Long Term: Able to explain home exercise prescription to exercise independently              Copy of goals given to participant.

## 2020-10-28 ENCOUNTER — Encounter: Payer: Medicare HMO | Attending: Cardiology

## 2020-10-28 ENCOUNTER — Encounter: Payer: Self-pay | Admitting: *Deleted

## 2020-10-28 ENCOUNTER — Other Ambulatory Visit: Payer: Self-pay

## 2020-10-28 DIAGNOSIS — Z951 Presence of aortocoronary bypass graft: Secondary | ICD-10-CM | POA: Insufficient documentation

## 2020-10-28 DIAGNOSIS — Z952 Presence of prosthetic heart valve: Secondary | ICD-10-CM | POA: Diagnosis not present

## 2020-10-28 DIAGNOSIS — Z87891 Personal history of nicotine dependence: Secondary | ICD-10-CM | POA: Diagnosis not present

## 2020-10-28 DIAGNOSIS — Z79899 Other long term (current) drug therapy: Secondary | ICD-10-CM | POA: Insufficient documentation

## 2020-10-28 LAB — GLUCOSE, CAPILLARY
Glucose-Capillary: 121 mg/dL — ABNORMAL HIGH (ref 70–99)
Glucose-Capillary: 138 mg/dL — ABNORMAL HIGH (ref 70–99)

## 2020-10-28 NOTE — Progress Notes (Signed)
Cardiac Individual Treatment Plan  Patient Details  Name: Derrick Mckenzie MRN: [id number] Date of Birth: 11-Oct-1951 Referring Provider:     Cardiac Rehab from 10/26/2020 in [facility] Cardiac and Pulmonary Rehab  Referring Provider Fath      Initial Encounter Date:    Cardiac Rehab from 10/26/2020 in [facility] Cardiac and Pulmonary Rehab  Date 10/26/20      Visit Diagnosis: S/P CABG x 2  S/P AVR (aortic valve replacement)  Patient's Home Medications on Admission:  Current Outpatient Medications:  .  albuterol (PROVENTIL HFA;VENTOLIN HFA) 108 (90 Base) MCG/ACT inhaler, Inhale 2 puffs into the lungs every 6 (six) hours as needed for wheezing or shortness of breath., Disp: 1 Inhaler, Rfl: 2 .  amLODipine (NORVASC) 5 MG tablet, Take 5 mg by mouth daily. (Patient not taking: Reported on 10/19/2020), Disp: , Rfl:  .  aspirin EC 81 MG tablet, Take 81 mg by mouth daily. Swallow whole., Disp: , Rfl:  .  atorvastatin (LIPITOR) 80 MG tablet, , Disp: , Rfl:  .  clopidogrel (PLAVIX) 75 MG tablet, Take 1 tablet (75 mg total) by mouth daily., Disp: 30 tablet, Rfl: 0 .  Fluticasone-Salmeterol (ADVAIR) 100-50 MCG/DOSE AEPB, Inhale 1 puff into the lungs 2 (two) times daily. (Patient not taking: Reported on 05/08/2020), Disp: , Rfl:  .  furosemide (LASIX) 40 MG tablet, Take 40 mg by mouth daily. , Disp: , Rfl:  .  Krill Oil 1000 MG CAPS, Take 2,000 mg by mouth 3 (three) times daily., Disp: , Rfl:  .  levothyroxine (SYNTHROID) 100 MCG tablet, Take 100 mcg by mouth daily., Disp: , Rfl:  .  metFORMIN (GLUCOPHAGE-XR) 500 MG 24 hr tablet, Take 500 mg by mouth in the morning and at bedtime., Disp: , Rfl:  .  Multiple Vitamin (MULTIVITAMIN WITH MINERALS) TABS tablet, Take 1 tablet by mouth daily. (Patient not taking: Reported on 05/08/2020), Disp: , Rfl:  .  omega-3 acid ethyl esters (LOVAZA) 1 g capsule, Take 2 g by mouth daily.  (Patient not taking: Reported on 10/19/2020), Disp: , Rfl:  .  traZODone (DESYREL) 50 MG  tablet, Take by mouth., Disp: , Rfl:   Past Medical History: Past Medical History:  Diagnosis Date  . Arthritis    knees  . Complication of anesthesia    aggitation after knee surgery  . Family history of adverse reaction to anesthesia    brother - hallucinations  . GERD (gastroesophageal reflux disease)   . Hx of smoking 06/26/2015  . Hyperlipidemia   . Hypertension   . Hypothyroidism   . Kidney stone   . Morbid obesity ([place])   . Murmur   . Prediabetes 10/14/2016  . Sleep apnea    score of 5 on apnea screen    Tobacco Use: Social History   Tobacco Use  Smoking Status Former Smoker  . Packs/day: 0.50  . Years: 10.00  . Pack years: 5.00  . Types: Cigarettes  . Quit date: 02/03/2016  . Years since quitting: 4.7  Smokeless Tobacco Never Used  Tobacco Comment   Quit in 2017    Labs: Recent Review Flowsheet Data    Labs for ITP Cardiac and Pulmonary Rehab Latest Ref Rng & Units 08/05/2016 09/27/2016 02/13/2017 02/26/2020 02/27/2020   Cholestrol 0 - 200 mg/dL - 182 171 - 137   LDLCALC 0 - 99 mg/dL - 95 88 - 62   HDL >40 mg/dL - 29(L) 29(L) - 28(L)   Trlycerides <150 mg/dL - 291(H) 269(H) -  237(H)   Hemoglobin A1c 4.8 - 5.6 % 5.8(H) - 5.6 6.3(H) 6.2(H)       Exercise Target Goals: Exercise Program Goal: Individual exercise prescription set using results from initial 6 min walk test and THRR while considering  patient's activity barriers and safety.   Exercise Prescription Goal: Initial exercise prescription builds to 30-45 minutes a day of aerobic activity, 2-3 days per week.  Home exercise guidelines will be given to patient during program as part of exercise prescription that the participant will acknowledge.   Education: Aerobic Exercise: - Group verbal and visual presentation on the components of exercise prescription. Introduces F.I.T.T principle from ACSM for exercise prescriptions.  Reviews F.I.T.T. principles of aerobic exercise including progression. Written  material given at graduation.   Education: Resistance Exercise: - Group verbal and visual presentation on the components of exercise prescription. Introduces F.I.T.T principle from ACSM for exercise prescriptions  Reviews F.I.T.T. principles of resistance exercise including progression. Written material given at graduation.    Education: Exercise & Equipment Safety: - Individual verbal instruction and demonstration of equipment use and safety with use of the equipment.   Cardiac Rehab from 10/26/2020 in [facility] Cardiac and Pulmonary Rehab  Date 10/26/20  Educator AS  Instruction Review Code 1- Verbalizes Understanding      Education: Exercise Physiology & General Exercise Guidelines: - Group verbal and written instruction with models to review the exercise physiology of the cardiovascular system and associated critical values. Provides general exercise guidelines with specific guidelines to those with heart or lung disease.    Education: Flexibility, Balance, Mind/Body Relaxation: - Group verbal and visual presentation with interactive activity on the components of exercise prescription. Introduces F.I.T.T principle from ACSM for exercise prescriptions. Reviews F.I.T.T. principles of flexibility and balance exercise training including progression. Also discusses the mind body connection.  Reviews various relaxation techniques to help reduce and manage stress (i.e. Deep breathing, progressive muscle relaxation, and visualization). Balance handout provided to take home. Written material given at graduation.   Activity Barriers & Risk Stratification:  Activity Barriers & Cardiac Risk Stratification - 10/19/20 1114      Activity Barriers & Cardiac Risk Stratification   Activity Barriers None    Cardiac Risk Stratification High           6 Minute Walk:  6 Minute Walk    Row Name 10/26/20 1035         6 Minute Walk   Phase Initial     Distance 1205 feet     Walk Time 6 minutes      # of Rest Breaks 0     MPH 2.28     METS 2.6     RPE 10     Perceived Dyspnea  1     VO2 Peak 9.2     Symptoms No     Resting HR 77 bpm     Resting BP 152/78     Resting Oxygen Saturation  94 %     Exercise Oxygen Saturation  during 6 min walk 93 %     Max Ex. HR 137 bpm     Max Ex. BP 188/86     2 Minute Post BP 150/88            Oxygen Initial Assessment:   Oxygen Re-Evaluation:   Oxygen Discharge (Final Oxygen Re-Evaluation):   Initial Exercise Prescription:  Initial Exercise Prescription - 10/26/20 1000      Date of Initial Exercise RX and Referring  Provider   Date 10/26/20    Referring Provider Fath      Treadmill   MPH 2    Grade 0.5    Minutes 15    METs 2.6      Elliptical   Level 1    Speed 3    Minutes 15    METs 2.6      REL-XR   Level 1    Speed 50    Minutes 15    METs 2.6      T5 Nustep   Level 2    SPM 80    Minutes 15    METs 2.6      Prescription Details   Frequency (times per week) 3    Duration Progress to 30 minutes of continuous aerobic without signs/symptoms of physical distress      Intensity   THRR 40-80% of Max Heartrate 105-135    Ratings of Perceived Exertion 11-13    Perceived Dyspnea 0-4      Resistance Training   Training Prescription Yes    Weight 3 lb    Reps 10-15           Perform Capillary Blood Glucose checks as needed.  Exercise Prescription Changes:   Exercise Comments:   Exercise Goals and Review:  Exercise Goals    Row Name 10/26/20 1059             Exercise Goals   Increase Physical Activity Yes       Intervention Provide advice, education, support and counseling about physical activity/exercise needs.;Develop an individualized exercise prescription for aerobic and resistive training based on initial evaluation findings, risk stratification, comorbidities and participant's personal goals.       Expected Outcomes Short Term: Attend rehab on a regular basis to increase amount of  physical activity.;Long Term: Add in home exercise to make exercise part of routine and to increase amount of physical activity.;Long Term: Exercising regularly at least 3-5 days a week.       Increase Strength and Stamina Yes       Intervention Provide advice, education, support and counseling about physical activity/exercise needs.;Develop an individualized exercise prescription for aerobic and resistive training based on initial evaluation findings, risk stratification, comorbidities and participant's personal goals.       Expected Outcomes Short Term: Increase workloads from initial exercise prescription for resistance, speed, and METs.;Short Term: Perform resistance training exercises routinely during rehab and add in resistance training at home;Long Term: Improve cardiorespiratory fitness, muscular endurance and strength as measured by increased METs and functional capacity (6MWT)       Able to understand and use rate of perceived exertion (RPE) scale Yes       Intervention Provide education and explanation on how to use RPE scale       Expected Outcomes Short Term: Able to use RPE daily in rehab to express subjective intensity level;Long Term:  Able to use RPE to guide intensity level when exercising independently       Able to understand and use Dyspnea scale Yes       Intervention Provide education and explanation on how to use Dyspnea scale       Expected Outcomes Short Term: Able to use Dyspnea scale daily in rehab to express subjective sense of shortness of breath during exertion;Long Term: Able to use Dyspnea scale to guide intensity level when exercising independently       Knowledge and understanding of Target Heart Rate Range (  THRR) Yes       Intervention Provide education and explanation of THRR including how the numbers were predicted and where they are located for reference       Expected Outcomes Short Term: Able to state/look up THRR;Short Term: Able to use daily as guideline for  intensity in rehab;Long Term: Able to use THRR to govern intensity when exercising independently       Able to check pulse independently Yes       Intervention Provide education and demonstration on how to check pulse in carotid and radial arteries.;Review the importance of being able to check your own pulse for safety during independent exercise       Expected Outcomes Short Term: Able to explain why pulse checking is important during independent exercise;Long Term: Able to check pulse independently and accurately       Understanding of Exercise Prescription Yes       Intervention Provide education, explanation, and written materials on patient's individual exercise prescription       Expected Outcomes Short Term: Able to explain program exercise prescription;Long Term: Able to explain home exercise prescription to exercise independently              Exercise Goals Re-Evaluation :   Discharge Exercise Prescription (Final Exercise Prescription Changes):   Nutrition:  Target Goals: Understanding of nutrition guidelines, daily intake of sodium 1500mg , cholesterol 200mg , calories 30% from fat and 7% or less from saturated fats, daily to have 5 or more servings of fruits and vegetables.  Education: All About Nutrition: -Group instruction provided by verbal, written material, interactive activities, discussions, models, and posters to present general guidelines for heart healthy nutrition including fat, fiber, MyPlate, the role of sodium in heart healthy nutrition, utilization of the nutrition label, and utilization of this knowledge for meal planning. Follow up email sent as well. Written material given at graduation.   Biometrics:  Pre Biometrics - 10/26/20 1540      Pre Biometrics   Height 5' 8.25" (1.734 m)    Weight 294 lb 12.8 oz (133.7 kg)    BMI (Calculated) 44.47    Single Leg Stand 6.04 seconds            Nutrition Therapy Plan and Nutrition Goals:   Nutrition  Assessments:  MEDIFICTS Score Key:  ?70 Need to make dietary changes   40-70 Heart Healthy Diet  ? 40 Therapeutic Level Cholesterol Diet    Cardiac Rehab from 10/26/2020 in [facility] Cardiac and Pulmonary Rehab  Picture Your Plate Total Score on Admission 50     Picture Your Plate Scores:  <47 Unhealthy dietary pattern with much room for improvement.  41-50 Dietary pattern unlikely to meet recommendations for good health and room for improvement.  51-60 More healthful dietary pattern, with some room for improvement.   >60 Healthy dietary pattern, although there may be some specific behaviors that could be improved.    Nutrition Goals Re-Evaluation:   Nutrition Goals Discharge (Final Nutrition Goals Re-Evaluation):   Psychosocial: Target Goals: Acknowledge presence or absence of significant depression and/or stress, maximize coping skills, provide positive support system. Participant is able to verbalize types and ability to use techniques and skills needed for reducing stress and depression.   Education: Stress, Anxiety, and Depression - Group verbal and visual presentation to define topics covered.  Reviews how body is impacted by stress, anxiety, and depression.  Also discusses healthy ways to reduce stress and to treat/manage anxiety and depression.  Written  material given at graduation.   Education: Sleep Hygiene -Provides group verbal and written instruction about how sleep can affect your health.  Define sleep hygiene, discuss sleep cycles and impact of sleep habits. Review good sleep hygiene tips.    Initial Review & Psychosocial Screening:  Initial Psych Review & Screening - 10/19/20 1115      Initial Review   Current issues with Current Sleep Concerns   Since daughter passed away in 2020/08/02 have not been sleeping well since. Has PRN med to help sleep..     Family Dynamics   Concerns Recent loss of child    Comments Daughter passed away in 08-02-2020,       Barriers   Psychosocial barriers to participate in program There are no identifiable barriers or psychosocial needs.;The patient should benefit from training in stress management and relaxation.      Screening Interventions   Interventions Encouraged to exercise;To provide support and resources with identified psychosocial needs;Provide feedback about the scores to participant    Expected Outcomes Short Term goal: Utilizing psychosocial counselor, staff and physician to assist with identification of specific Stressors or current issues interfering with healing process. Setting desired goal for each stressor or current issue identified.;Long Term Goal: Stressors or current issues are controlled or eliminated.;Short Term goal: Identification and review with participant of any Quality of Life or Depression concerns found by scoring the questionnaire.;Long Term goal: The participant improves quality of Life and PHQ9 Scores as seen by post scores and/or verbalization of changes           Quality of Life Scores:   Quality of Life - 10/26/20 1053      Quality of Life   Select Quality of Life      Quality of Life Scores   Health/Function Pre 22.8 %    Socioeconomic Pre 19.63 %    Psych/Spiritual Pre 28.29 %    Family Pre 25.2 %    GLOBAL Pre 23.51 %          Scores of 19 and below usually indicate a poorer quality of life in these areas.  A difference of  2-3 points is a clinically meaningful difference.  A difference of 2-3 points in the total score of the Quality of Life Index has been associated with significant improvement in overall quality of life, self-image, physical symptoms, and general health in studies assessing change in quality of life.  PHQ-9: Recent Review Flowsheet Data    Depression screen [facility] 2/9 10/26/2020 03/16/2017 02/13/2017 10/14/2016 09/27/2016   Decreased Interest 2 0 0 0 0   Down, Depressed, Hopeless 0 0 0 0 0   PHQ - 2 Score 2 0 0 0 0   Altered sleeping 2  - - - -    Tired, decreased energy 3  - - - -   Change in appetite 3  - - - -   Feeling bad or failure about yourself  0 - - - -   Trouble concentrating 2 - - - -   Moving slowly or fidgety/restless 0 - - - -   Suicidal thoughts 0 - - - -   PHQ-9 Score 12 - - - -   Difficult doing work/chores Somewhat difficult - - - -     Interpretation of Total Score  Total Score Depression Severity:  1-4 = Minimal depression, 5-9 = Mild depression, 10-14 = Moderate depression, 15-19 = Moderately severe depression, 20-27 = Severe depression  Psychosocial Evaluation and Intervention:  Psychosocial Evaluation - 10/19/20 1136      Psychosocial Evaluation & Interventions   Interventions Encouraged to exercise with the program and follow exercise prescription    Comments Ed is ready to start the program, he has no barriers to starting. He is ready to get back to his golf game as soon as possible. He has not exercised in some time and is overweight. He does want to lose weight and gain strength to get back to his activities. Ed lives with his wife in their home. He has a son in [place] and two step children, one lives in [place] andd the other in [place]. Ed 's daughter  ,60 years old, passed away in past 19-Aug-2023. He has not been sleeping well since then. He does have a PRN med to help for sleep.  Ed stated that he had gone to visit some family and the night he returned home, he slept well. He will watch and see if this continues. He should do well with the program. He has the desire to get well and get back to his golf game and lose some weight .    Expected Outcomes STG: Ed attends all scheduled sessions to be able to learn the steps to weight loss and to get stronger to get back to his golf game. LTG: Ed is able to continue with his progress from program with his weight loss and getting back to his golf game.    Continue Psychosocial Services  Follow up required by staff           Psychosocial Re-Evaluation:   Psychosocial  Discharge (Final Psychosocial Re-Evaluation):   Vocational Rehabilitation: Provide vocational rehab assistance to qualifying candidates.   Vocational Rehab Evaluation & Intervention:  Vocational Rehab - 10/19/20 1125      Initial Vocational Rehab Evaluation & Intervention   Assessment shows need for Vocational Rehabilitation No           Education: Education Goals: Education classes will be provided on a variety of topics geared toward better understanding of heart health and risk factor modification. Participant will state understanding/return demonstration of topics presented as noted by education test scores.  Learning Barriers/Preferences:  Learning Barriers/Preferences - 10/19/20 1123      Learning Barriers/Preferences   Learning Barriers None    Learning Preferences Skilled Demonstration;Verbal Instruction           General Cardiac Education Topics:  AED/CPR: - Group verbal and written instruction with the use of models to demonstrate the basic use of the AED with the basic ABC's of resuscitation.   Anatomy and Cardiac Procedures: - Group verbal and visual presentation and models provide information about basic cardiac anatomy and function. Reviews the testing methods done to diagnose heart disease and the outcomes of the test results. Describes the treatment choices: Medical Management, Angioplasty, or Coronary Bypass Surgery for treating various heart conditions including Myocardial Infarction, Angina, Valve Disease, and Cardiac Arrhythmias.  Written material given at graduation.   Medication Safety: - Group verbal and visual instruction to review commonly prescribed medications for heart and lung disease. Reviews the medication, class of the drug, and side effects. Includes the steps to properly store meds and maintain the prescription regimen.  Written material given at graduation.   Intimacy: - Group verbal instruction through game format to discuss how heart  and lung disease can affect sexual intimacy. Written material given at graduation..   Know Your Numbers and Heart Failure: -  Group verbal and visual instruction to discuss disease risk factors for cardiac and pulmonary disease and treatment options.  Reviews associated critical values for Overweight/Obesity, Hypertension, Cholesterol, and Diabetes.  Discusses basics of heart failure: signs/symptoms and treatments.  Introduces Heart Failure Zone chart for action plan for heart failure.  Written material given at graduation.   Infection Prevention: - Provides verbal and written material to individual with discussion of infection control including proper hand washing and proper equipment cleaning during exercise session.   Cardiac Rehab from 10/26/2020 in [facility] Cardiac and Pulmonary Rehab  Date 10/26/20  Educator AS  Instruction Review Code 1- Verbalizes Understanding      Falls Prevention: - Provides verbal and written material to individual with discussion of falls prevention and safety.   Cardiac Rehab from 10/26/2020 in [facility] Cardiac and Pulmonary Rehab  Date 10/26/20  Educator AS  Instruction Review Code 1- Verbalizes Understanding      Other: -Provides group and verbal instruction on various topics (see comments)   Knowledge Questionnaire Score:   Core Components/Risk Factors/Patient Goals at Admission:  Personal Goals and Risk Factors at Admission - 10/26/20 1046      Core Components/Risk Factors/Patient Goals on Admission    Weight Management Yes;Weight Loss    Intervention Weight Management: Develop a combined nutrition and exercise program designed to reach desired caloric intake, while maintaining appropriate intake of nutrient and fiber, sodium and fats, and appropriate energy expenditure required for the weight goal.;Weight Management: Provide education and appropriate resources to help participant work on and attain dietary goals.;Obesity: Provide education and  appropriate resources to help participant work on and attain dietary goals.    Admit Weight 291 lb (132 kg)    Goal Weight: Short Term 288 lb (130.6 kg)    Goal Weight: Long Term 230 lb (104.3 kg)    Expected Outcomes Short Term: Continue to assess and modify interventions until short term weight is achieved;Long Term: Adherence to nutrition and physical activity/exercise program aimed toward attainment of established weight goal;Weight Loss: Understanding of general recommendations for a balanced deficit meal plan, which promotes 1-2 lb weight loss per week and includes a negative energy balance of [phone] kcal/d    Diabetes Yes    Intervention Provide education about signs/symptoms and action to take for hypo/hyperglycemia.;Provide education about proper nutrition, including hydration, and aerobic/resistive exercise prescription along with prescribed medications to achieve blood glucose in normal ranges: Fasting glucose 65-99 mg/dL    Expected Outcomes Short Term: Participant verbalizes understanding of the signs/symptoms and immediate care of hyper/hypoglycemia, proper foot care and importance of medication, aerobic/resistive exercise and nutrition plan for blood glucose control.;Long Term: Attainment of HbA1C < 7%.    Lipids Yes    Intervention Provide education and support for participant on nutrition & aerobic/resistive exercise along with prescribed medications to achieve LDL 70mg , HDL >40mg .    Expected Outcomes Short Term: Participant states understanding of desired cholesterol values and is compliant with medications prescribed. Participant is following exercise prescription and nutrition guidelines.;Long Term: Cholesterol controlled with medications as prescribed, with individualized exercise RX and with personalized nutrition plan. Value goals: LDL < 70mg , HDL > 40 mg.           Education:Diabetes - Individual verbal and written instruction to review signs/symptoms of diabetes, desired  ranges of glucose level fasting, after meals and with exercise. Acknowledge that pre and post exercise glucose checks will be done for 3 sessions at entry of program.   Core Components/Risk Factors/Patient  Goals Review:    Core Components/Risk Factors/Patient Goals at Discharge (Final Review):    ITP Comments:  ITP Comments    Row Name 10/19/20 1132 10/28/20 0912         ITP Comments Virtual orientation call completed today. he has an appointment on Date: 10/26/2020 for EP eval and gym Orientation.  Documentation of diagnosis can be found in [facility] Date: [id number]. 106 Day review completed. Medical Director ITP review done, changes made as directed, and signed approval by Medical Director.             Comments:

## 2020-10-28 NOTE — Progress Notes (Signed)
Daily Session Note  Patient Details  Name: MICAI APOLINAR MRN: [id number] Date of Birth: [birth year] Referring Provider:     Cardiac Rehab from 10/26/2020 in [facility] Cardiac and Pulmonary Rehab  Referring Provider Fath      Encounter Date: 10/28/2020  Check In:  Session Check In - 10/28/20 0957      Check-In   Supervising physician immediately available to respond to emergencies See telemetry face sheet for immediately available ER MD    Location [facility]-Cardiac & Pulmonary Rehab    Staff Present Birdie Sons, MPA, Elveria Rising, BA, ACSM CEP, Exercise Physiologist;Kara Eliezer Bottom, MS Exercise Physiologist    Virtual Visit No    Medication changes reported     No    Fall or balance concerns reported    No    Warm-up and Cool-down Performed on first and last piece of equipment    Resistance Training Performed Yes    VAD Patient? No    PAD/SET Patient? No      Pain Assessment   Currently in Pain? No/denies              Social History   Tobacco Use  Smoking Status Former Smoker  . Packs/day: 0.50  . Years: 10.00  . Pack years: 5.00  . Types: Cigarettes  . Quit date: 02/03/2016  . Years since quitting: 4.7  Smokeless Tobacco Never Used  Tobacco Comment   Quit in 2017    Goals Met:  Independence with exercise equipment Exercise tolerated well No report of cardiac concerns or symptoms Strength training completed today  Goals Unmet:  Not Applicable  Comments: First full day of exercise!  Patient was oriented to gym and equipment including functions, settings, policies, and procedures.  Patient's individual exercise prescription and treatment plan were reviewed.  All starting workloads were established based on the results of the 6 minute walk test done at initial orientation visit.  The plan for exercise progression was also introduced and progression will be customized based on patient's performance and goals.    Dr. Emily Filbert is Medical Director for  [facility].

## 2020-10-30 ENCOUNTER — Other Ambulatory Visit: Payer: Self-pay

## 2020-10-30 DIAGNOSIS — Z952 Presence of prosthetic heart valve: Secondary | ICD-10-CM

## 2020-10-30 DIAGNOSIS — Z951 Presence of aortocoronary bypass graft: Secondary | ICD-10-CM | POA: Diagnosis not present

## 2020-10-30 DIAGNOSIS — Z79899 Other long term (current) drug therapy: Secondary | ICD-10-CM | POA: Diagnosis not present

## 2020-10-30 DIAGNOSIS — Z87891 Personal history of nicotine dependence: Secondary | ICD-10-CM | POA: Diagnosis not present

## 2020-10-30 LAB — GLUCOSE, CAPILLARY
Glucose-Capillary: 161 mg/dL — ABNORMAL HIGH (ref 70–99)
Glucose-Capillary: 128 mg/dL — ABNORMAL HIGH (ref 70–99)

## 2020-10-30 NOTE — Progress Notes (Signed)
Daily Session Note  Patient Details  Name: Derrick Mckenzie MRN: [id number] Date of Birth: 07-17-1951 Referring Provider:     Cardiac Rehab from 10/26/2020 in [facility] Cardiac and Pulmonary Rehab  Referring Provider Fath      Encounter Date: 10/30/2020  Check In:  Session Check In - 10/30/20 0954      Check-In   Supervising physician immediately available to respond to emergencies See telemetry face sheet for immediately available ER MD    Location [facility]-Cardiac & Pulmonary Rehab    Staff Present Birdie Sons, MPA, RN;Laureen Owens Shark, BS, RRT, CPFT;Meredith Sherryll Burger, RN BSN;Joseph Hood RCP,RRT,BSRT    Virtual Visit No    Medication changes reported     No    Fall or balance concerns reported    No    Warm-up and Cool-down Performed on first and last piece of equipment    Resistance Training Performed Yes    VAD Patient? No    PAD/SET Patient? No      Pain Assessment   Currently in Pain? No/denies              Social History   Tobacco Use  Smoking Status Former Smoker  . Packs/day: 0.50  . Years: 10.00  . Pack years: 5.00  . Types: Cigarettes  . Quit date: 02/03/2016  . Years since quitting: 4.7  Smokeless Tobacco Never Used  Tobacco Comment   Quit in 2017    Goals Met:  Independence with exercise equipment Exercise tolerated well No report of cardiac concerns or symptoms Strength training completed today  Goals Unmet:  Not Applicable  Comments: Pt able to follow exercise prescription today without complaint.  Will continue to monitor for progression.    Dr. Emily Filbert is Medical Director for [place] and [facility].

## 2020-11-02 ENCOUNTER — Encounter: Payer: Medicare HMO | Admitting: *Deleted

## 2020-11-02 ENCOUNTER — Other Ambulatory Visit: Payer: Self-pay

## 2020-11-02 DIAGNOSIS — Z951 Presence of aortocoronary bypass graft: Secondary | ICD-10-CM | POA: Diagnosis not present

## 2020-11-02 DIAGNOSIS — Z87891 Personal history of nicotine dependence: Secondary | ICD-10-CM | POA: Diagnosis not present

## 2020-11-02 DIAGNOSIS — Z952 Presence of prosthetic heart valve: Secondary | ICD-10-CM | POA: Diagnosis not present

## 2020-11-02 DIAGNOSIS — Z79899 Other long term (current) drug therapy: Secondary | ICD-10-CM | POA: Diagnosis not present

## 2020-11-02 LAB — GLUCOSE, CAPILLARY
Glucose-Capillary: 154 mg/dL — ABNORMAL HIGH (ref 70–99)
Glucose-Capillary: 138 mg/dL — ABNORMAL HIGH (ref 70–99)

## 2020-11-02 NOTE — Progress Notes (Signed)
Daily Session Note  Patient Details  Name: NAFTOLI PENNY MRN: [id number] Date of Birth: [birth year] Referring Provider:     Cardiac Rehab from 10/26/2020 in [facility] Cardiac and Pulmonary Rehab  Referring Provider Fath      Encounter Date: 11/02/2020  Check In:  Session Check In - 11/02/20 1045      Check-In   Supervising physician immediately available to respond to emergencies See telemetry face sheet for immediately available ER MD    Location [facility]-Cardiac & Pulmonary Rehab    Staff Present Renita Papa, RN Vickki Hearing, BA, ACSM CEP, Exercise Physiologist;Kelly Amedeo Plenty, BS, ACSM CEP, Exercise Physiologist    Virtual Visit No    Medication changes reported     No    Fall or balance concerns reported    No    Warm-up and Cool-down Performed on first and last piece of equipment    Resistance Training Performed Yes    VAD Patient? No    PAD/SET Patient? No      Pain Assessment   Currently in Pain? No/denies              Social History   Tobacco Use  Smoking Status Former Smoker  . Packs/day: 0.50  . Years: 10.00  . Pack years: 5.00  . Types: Cigarettes  . Quit date: 02/03/2016  . Years since quitting: 4.7  Smokeless Tobacco Never Used  Tobacco Comment   Quit in 2017    Goals Met:  Independence with exercise equipment Exercise tolerated well No report of cardiac concerns or symptoms Strength training completed today  Goals Unmet:  Not Applicable  Comments: Pt able to follow exercise prescription today without complaint.  Will continue to monitor for progression.    Dr. Emily Filbert is Medical Director for [place] and [facility].

## 2020-11-04 ENCOUNTER — Other Ambulatory Visit: Payer: Self-pay

## 2020-11-04 DIAGNOSIS — Z79899 Other long term (current) drug therapy: Secondary | ICD-10-CM | POA: Diagnosis not present

## 2020-11-04 DIAGNOSIS — Z952 Presence of prosthetic heart valve: Secondary | ICD-10-CM | POA: Diagnosis not present

## 2020-11-04 DIAGNOSIS — Z87891 Personal history of nicotine dependence: Secondary | ICD-10-CM | POA: Diagnosis not present

## 2020-11-04 DIAGNOSIS — Z951 Presence of aortocoronary bypass graft: Secondary | ICD-10-CM

## 2020-11-04 NOTE — Progress Notes (Signed)
Daily Session Note  Patient Details  Name: Derrick Mckenzie MRN: [id number] Date of Birth: [birth year] Referring Provider:     Cardiac Rehab from 10/26/2020 in [facility] Cardiac and Pulmonary Rehab  Referring Provider Fath      Encounter Date: 11/04/2020  Check In:  Session Check In - 11/04/20 1030      Check-In   Supervising physician immediately available to respond to emergencies See telemetry face sheet for immediately available ER MD    Location [facility]-Cardiac & Pulmonary Rehab    Staff Present Birdie Sons, MPA, Elveria Rising, BA, ACSM CEP, Exercise Physiologist;Joseph Tessie Fass RCP,RRT,BSRT    Virtual Visit No    Medication changes reported     No    Fall or balance concerns reported    No    Warm-up and Cool-down Performed on first and last piece of equipment    Resistance Training Performed Yes    VAD Patient? No    PAD/SET Patient? No      Pain Assessment   Currently in Pain? No/denies              Social History   Tobacco Use  Smoking Status Former Smoker  . Packs/day: 0.50  . Years: 10.00  . Pack years: 5.00  . Types: Cigarettes  . Quit date: 02/03/2016  . Years since quitting: 4.7  Smokeless Tobacco Never Used  Tobacco Comment   Quit in 2017    Goals Met:  Independence with exercise equipment Exercise tolerated well No report of cardiac concerns or symptoms Strength training completed today  Goals Unmet:  Not Applicable  Comments: Pt able to follow exercise prescription today without complaint.  Will continue to monitor for progression.    Dr. Emily Filbert is Medical Director for [place] [facility].

## 2020-11-11 ENCOUNTER — Encounter: Payer: Medicare HMO | Admitting: *Deleted

## 2020-11-11 ENCOUNTER — Other Ambulatory Visit: Payer: Self-pay

## 2020-11-11 DIAGNOSIS — Z952 Presence of prosthetic heart valve: Secondary | ICD-10-CM | POA: Diagnosis not present

## 2020-11-11 DIAGNOSIS — Z79899 Other long term (current) drug therapy: Secondary | ICD-10-CM | POA: Diagnosis not present

## 2020-11-11 DIAGNOSIS — Z951 Presence of aortocoronary bypass graft: Secondary | ICD-10-CM

## 2020-11-11 DIAGNOSIS — Z87891 Personal history of nicotine dependence: Secondary | ICD-10-CM | POA: Diagnosis not present

## 2020-11-11 NOTE — Progress Notes (Signed)
Daily Session Note  Patient Details  Name: Derrick Mckenzie MRN: [id number] Date of Birth: 02/13/1951 Referring Provider:   Flowsheet Row Cardiac Rehab from 10/26/2020 in [facility] Cardiac and Pulmonary Rehab  Referring Provider Fath      Encounter Date: 11/11/2020  Check In:  Session Check In - 11/11/20 1001      Check-In   Supervising physician immediately available to respond to emergencies See telemetry face sheet for immediately available ER MD    Location [facility]-Cardiac & Pulmonary Rehab    Staff Present Heath Lark, RN, BSN, CCRP    Virtual Visit No    Medication changes reported     No    Fall or balance concerns reported    No    Warm-up and Cool-down Performed on first and last piece of equipment    Resistance Training Performed Yes    VAD Patient? No    PAD/SET Patient? No      Pain Assessment   Currently in Pain? No/denies              Social History   Tobacco Use  Smoking Status Former Smoker  . Packs/day: 0.50  . Years: 10.00  . Pack years: 5.00  . Types: Cigarettes  . Quit date: 02/03/2016  . Years since quitting: 4.7  Smokeless Tobacco Never Used  Tobacco Comment   Quit in 2017    Goals Met:  Independence with exercise equipment Exercise tolerated well No report of cardiac concerns or symptoms  Goals Unmet:  Not Applicable  Comments: Pt able to follow exercise prescription today without complaint.  Will continue to monitor for progression.    Dr. Emily Filbert is Medical Director for [place] and [facility].

## 2020-11-13 ENCOUNTER — Encounter: Payer: Medicare HMO | Admitting: *Deleted

## 2020-11-13 ENCOUNTER — Other Ambulatory Visit: Payer: Self-pay

## 2020-11-13 DIAGNOSIS — Z79899 Other long term (current) drug therapy: Secondary | ICD-10-CM | POA: Diagnosis not present

## 2020-11-13 DIAGNOSIS — Z952 Presence of prosthetic heart valve: Secondary | ICD-10-CM | POA: Diagnosis not present

## 2020-11-13 DIAGNOSIS — Z951 Presence of aortocoronary bypass graft: Secondary | ICD-10-CM

## 2020-11-13 DIAGNOSIS — Z87891 Personal history of nicotine dependence: Secondary | ICD-10-CM | POA: Diagnosis not present

## 2020-11-13 NOTE — Progress Notes (Signed)
Daily Session Note  Patient Details  Name: Derrick Mckenzie MRN: [id number] Date of Birth: Aug 26, 1951 Referring Provider:   Flowsheet Row Cardiac Rehab from 10/26/2020 in [facility] Cardiac and Pulmonary Rehab  Referring Provider Fath      Encounter Date: 11/13/2020  Check In:  Session Check In - 11/13/20 0957      Check-In   Supervising physician immediately available to respond to emergencies See telemetry face sheet for immediately available ER MD    Location [facility]-Cardiac & Pulmonary Rehab    Staff Present Heath Lark, RN, BSN, CCRP;Joseph Hood RCP,RRT,BSRT;Jessica [place], [place], [place], [place], CCET    Virtual Visit No    Medication changes reported     No    Fall or balance concerns reported    No    Warm-up and Cool-down Performed on first and last piece of equipment    Resistance Training Performed Yes    VAD Patient? No    PAD/SET Patient? No      Pain Assessment   Currently in Pain? No/denies              Social History   Tobacco Use  Smoking Status Former Smoker  . Packs/day: 0.50  . Years: 10.00  . Pack years: 5.00  . Types: Cigarettes  . Quit date: 02/03/2016  . Years since quitting: 4.7  Smokeless Tobacco Never Used  Tobacco Comment   Quit in 2017    Goals Met:  Independence with exercise equipment Exercise tolerated well No report of cardiac concerns or symptoms  Goals Unmet:  Not Applicable  Comments: Pt able to follow exercise prescription today without complaint.  Will continue to monitor for progression.    Dr. Emily Filbert is Medical Director for [place] [facility].

## 2020-11-16 ENCOUNTER — Encounter: Payer: Medicare HMO | Admitting: *Deleted

## 2020-11-16 ENCOUNTER — Other Ambulatory Visit: Payer: Self-pay

## 2020-11-16 DIAGNOSIS — Z87891 Personal history of nicotine dependence: Secondary | ICD-10-CM | POA: Diagnosis not present

## 2020-11-16 DIAGNOSIS — Z952 Presence of prosthetic heart valve: Secondary | ICD-10-CM | POA: Diagnosis not present

## 2020-11-16 DIAGNOSIS — Z951 Presence of aortocoronary bypass graft: Secondary | ICD-10-CM | POA: Diagnosis not present

## 2020-11-16 DIAGNOSIS — Z79899 Other long term (current) drug therapy: Secondary | ICD-10-CM | POA: Diagnosis not present

## 2020-11-16 NOTE — Progress Notes (Signed)
Daily Session Note  Patient Details  Name: Derrick Mckenzie MRN: [id number] Date of Birth: [birth year] Referring Provider:   Flowsheet Row Cardiac Rehab from 10/26/2020 in [facility] Cardiac and Pulmonary Rehab  Referring Provider Fath      Encounter Date: 11/16/2020  Check In:  Session Check In - 11/16/20 1033      Check-In   Supervising physician immediately available to respond to emergencies See telemetry face sheet for immediately available ER MD    Location [facility]-Cardiac & Pulmonary Rehab    Staff Present Renita Papa, RN BSN;Joseph [address] [place], [place], ACSM CEP, Exercise Physiologist    Virtual Visit No    Medication changes reported     No    Fall or balance concerns reported    No    Warm-up and Cool-down Performed on first and last piece of equipment    Resistance Training Performed Yes    VAD Patient? No    PAD/SET Patient? No      Pain Assessment   Currently in Pain? No/denies              Social History   Tobacco Use  Smoking Status Former Smoker  . Packs/day: 0.50  . Years: 10.00  . Pack years: 5.00  . Types: Cigarettes  . Quit date: 02/03/2016  . Years since quitting: 4.7  Smokeless Tobacco Never Used  Tobacco Comment   Quit in 2017    Goals Met:  Independence with exercise equipment Exercise tolerated well No report of cardiac concerns or symptoms Strength training completed today  Goals Unmet:  Not Applicable  Comments: Pt able to follow exercise prescription today without complaint.  Will continue to monitor for progression.    Dr. Emily Filbert is Medical Director for [place] [facility].

## 2020-11-23 ENCOUNTER — Encounter: Payer: Medicare HMO | Admitting: *Deleted

## 2020-11-23 ENCOUNTER — Other Ambulatory Visit: Payer: Self-pay

## 2020-11-23 DIAGNOSIS — Z951 Presence of aortocoronary bypass graft: Secondary | ICD-10-CM | POA: Diagnosis not present

## 2020-11-23 DIAGNOSIS — Z952 Presence of prosthetic heart valve: Secondary | ICD-10-CM

## 2020-11-23 DIAGNOSIS — Z87891 Personal history of nicotine dependence: Secondary | ICD-10-CM | POA: Diagnosis not present

## 2020-11-23 DIAGNOSIS — Z79899 Other long term (current) drug therapy: Secondary | ICD-10-CM | POA: Diagnosis not present

## 2020-11-23 NOTE — Progress Notes (Signed)
Daily Session Note  Patient Details  Name: Derrick Mckenzie MRN: [id number] Date of Birth: [birth year] Referring Provider:   Flowsheet Row Cardiac Rehab from 10/26/2020 in [facility] Cardiac and Pulmonary Rehab  Referring Provider Fath      Encounter Date: 11/23/2020  Check In:  Session Check In - 11/23/20 1014      Check-In   Supervising physician immediately available to respond to emergencies See telemetry face sheet for immediately available ER MD    Location [facility]-Cardiac & Pulmonary Rehab    Staff Present Renita Papa, RN BSN;Joseph Foy [place], [place], ACSM CEP, Exercise Physiologist    Virtual Visit No    Medication changes reported     No    Fall or balance concerns reported    No    Warm-up and Cool-down Performed on first and last piece of equipment    Resistance Training Performed Yes    VAD Patient? No    PAD/SET Patient? No      Pain Assessment   Currently in Pain? No/denies              Social History   Tobacco Use  Smoking Status Former Smoker  . Packs/day: 0.50  . Years: 10.00  . Pack years: 5.00  . Types: Cigarettes  . Quit date: 02/03/2016  . Years since quitting: 4.8  Smokeless Tobacco Never Used  Tobacco Comment   Quit in 2017    Goals Met:  Independence with exercise equipment Exercise tolerated well No report of cardiac concerns or symptoms Strength training completed today  Goals Unmet:  Not Applicable  Comments: Pt able to follow exercise prescription today without complaint.  Will continue to monitor for progression.    Dr. Emily Filbert is Medical Director for [place] [facility].

## 2020-11-25 ENCOUNTER — Encounter: Payer: Self-pay | Admitting: *Deleted

## 2020-11-25 ENCOUNTER — Encounter: Payer: Medicare HMO | Admitting: *Deleted

## 2020-11-25 ENCOUNTER — Other Ambulatory Visit: Payer: Self-pay

## 2020-11-25 DIAGNOSIS — Z952 Presence of prosthetic heart valve: Secondary | ICD-10-CM | POA: Diagnosis not present

## 2020-11-25 DIAGNOSIS — Z79899 Other long term (current) drug therapy: Secondary | ICD-10-CM | POA: Diagnosis not present

## 2020-11-25 DIAGNOSIS — Z951 Presence of aortocoronary bypass graft: Secondary | ICD-10-CM

## 2020-11-25 DIAGNOSIS — Z87891 Personal history of nicotine dependence: Secondary | ICD-10-CM | POA: Diagnosis not present

## 2020-11-25 NOTE — Progress Notes (Signed)
Cardiac Individual Treatment Plan  Patient Details  Name: Derrick Mckenzie MRN: [id number] Date of Birth: 03-16-1951 Referring Provider:   Flowsheet Row Cardiac Rehab from 10/26/2020 in [facility] Cardiac and Pulmonary Rehab  Referring Provider Fath      Initial Encounter Date:  Flowsheet Row Cardiac Rehab from 10/26/2020 in [facility] Cardiac and Pulmonary Rehab  Date 10/26/20      Visit Diagnosis: S/P CABG x 2  S/P AVR (aortic valve replacement)  Patient's Home Medications on Admission:  Current Outpatient Medications:  .  albuterol (PROVENTIL HFA;VENTOLIN HFA) 108 (90 Base) MCG/ACT inhaler, Inhale 2 puffs into the lungs every 6 (six) hours as needed for wheezing or shortness of breath., Disp: 1 Inhaler, Rfl: 2 .  amLODipine (NORVASC) 5 MG tablet, Take 5 mg by mouth daily. (Patient not taking: Reported on 10/19/2020), Disp: , Rfl:  .  aspirin EC 81 MG tablet, Take 81 mg by mouth daily. Swallow whole., Disp: , Rfl:  .  atorvastatin (LIPITOR) 80 MG tablet, , Disp: , Rfl:  .  clopidogrel (PLAVIX) 75 MG tablet, Take 1 tablet (75 mg total) by mouth daily., Disp: 30 tablet, Rfl: 0 .  Fluticasone-Salmeterol (ADVAIR) 100-50 MCG/DOSE AEPB, Inhale 1 puff into the lungs 2 (two) times daily. (Patient not taking: Reported on 05/08/2020), Disp: , Rfl:  .  furosemide (LASIX) 40 MG tablet, Take 40 mg by mouth daily. , Disp: , Rfl:  .  Krill Oil 1000 MG CAPS, Take 2,000 mg by mouth 3 (three) times daily., Disp: , Rfl:  .  levothyroxine (SYNTHROID) 100 MCG tablet, Take 100 mcg by mouth daily., Disp: , Rfl:  .  metFORMIN (GLUCOPHAGE-XR) 500 MG 24 hr tablet, Take 500 mg by mouth in the morning and at bedtime., Disp: , Rfl:  .  Multiple Vitamin (MULTIVITAMIN WITH MINERALS) TABS tablet, Take 1 tablet by mouth daily. (Patient not taking: Reported on 05/08/2020), Disp: , Rfl:  .  omega-3 acid ethyl esters (LOVAZA) 1 g capsule, Take 2 g by mouth daily.  (Patient not taking: Reported on 10/19/2020), Disp: , Rfl:  .   traZODone (DESYREL) 50 MG tablet, Take by mouth., Disp: , Rfl:   Past Medical History: Past Medical History:  Diagnosis Date  . Arthritis    knees  . Complication of anesthesia    aggitation after knee surgery  . Family history of adverse reaction to anesthesia    brother - hallucinations  . GERD (gastroesophageal reflux disease)   . Hx of smoking 06/26/2015  . Hyperlipidemia   . Hypertension   . Hypothyroidism   . Kidney stone   . Morbid obesity ([place])   . Murmur   . Prediabetes 10/14/2016  . Sleep apnea    score of 5 on apnea screen    Tobacco Use: Social History   Tobacco Use  Smoking Status Former Smoker  . Packs/day: 0.50  . Years: 10.00  . Pack years: 5.00  . Types: Cigarettes  . Quit date: 02/03/2016  . Years since quitting: 4.8  Smokeless Tobacco Never Used  Tobacco Comment   Quit in 2017    Labs: Recent Review Flowsheet Data    Labs for ITP Cardiac and Pulmonary Rehab Latest Ref Rng & Units 08/05/2016 09/27/2016 02/13/2017 02/26/2020 02/27/2020   Cholestrol 0 - 200 mg/dL - 182 171 - 137   LDLCALC 0 - 99 mg/dL - 95 88 - 62   HDL >40 mg/dL - 29(L) 29(L) - 28(L)   Trlycerides <150 mg/dL - 291(H) 269(H) -  237(H)   Hemoglobin A1c 4.8 - 5.6 % 5.8(H) - 5.6 6.3(H) 6.2(H)       Exercise Target Goals: Exercise Program Goal: Individual exercise prescription set using results from initial 6 min walk test and THRR while considering  patient's activity barriers and safety.   Exercise Prescription Goal: Initial exercise prescription builds to 30-45 minutes a day of aerobic activity, 2-3 days per week.  Home exercise guidelines will be given to patient during program as part of exercise prescription that the participant will acknowledge.   Education: Aerobic Exercise: - Group verbal and visual presentation on the components of exercise prescription. Introduces F.I.T.T principle from ACSM for exercise prescriptions.  Reviews F.I.T.T. principles of aerobic exercise including  progression. Written material given at graduation.   Education: Resistance Exercise: - Group verbal and visual presentation on the components of exercise prescription. Introduces F.I.T.T principle from ACSM for exercise prescriptions  Reviews F.I.T.T. principles of resistance exercise including progression. Written material given at graduation.    Education: Exercise & Equipment Safety: - Individual verbal instruction and demonstration of equipment use and safety with use of the equipment. Flowsheet Row Cardiac Rehab from 10/28/2020 in [facility] Cardiac and Pulmonary Rehab  Date 10/26/20  Educator AS  Instruction Review Code 1- Verbalizes Understanding      Education: Exercise Physiology & General Exercise Guidelines: - Group verbal and written instruction with models to review the exercise physiology of the cardiovascular system and associated critical values. Provides general exercise guidelines with specific guidelines to those with heart or lung disease.    Education: Flexibility, Balance, Mind/Body Relaxation: - Group verbal and visual presentation with interactive activity on the components of exercise prescription. Introduces F.I.T.T principle from ACSM for exercise prescriptions. Reviews F.I.T.T. principles of flexibility and balance exercise training including progression. Also discusses the mind body connection.  Reviews various relaxation techniques to help reduce and manage stress (i.e. Deep breathing, progressive muscle relaxation, and visualization). Balance handout provided to take home. Written material given at graduation.   Activity Barriers & Risk Stratification:  Activity Barriers & Cardiac Risk Stratification - 10/19/20 1114      Activity Barriers & Cardiac Risk Stratification   Activity Barriers None    Cardiac Risk Stratification High           6 Minute Walk:  6 Minute Walk    Row Name 10/26/20 1035         6 Minute Walk   Phase Initial     Distance 1205  feet     Walk Time 6 minutes     # of Rest Breaks 0     MPH 2.28     METS 2.6     RPE 10     Perceived Dyspnea  1     VO2 Peak 9.2     Symptoms No     Resting HR 77 bpm     Resting BP 152/78     Resting Oxygen Saturation  94 %     Exercise Oxygen Saturation  during 6 min walk 93 %     Max Ex. HR 137 bpm     Max Ex. BP 188/86     2 Minute Post BP 150/88            Oxygen Initial Assessment:   Oxygen Re-Evaluation:   Oxygen Discharge (Final Oxygen Re-Evaluation):   Initial Exercise Prescription:  Initial Exercise Prescription - 10/26/20 1000      Date of Initial Exercise RX and Referring  Provider   Date 10/26/20    Referring Provider Fath      Treadmill   MPH 2    Grade 0.5    Minutes 15    METs 2.6      Elliptical   Level 1    Speed 3    Minutes 15    METs 2.6      REL-XR   Level 1    Speed 50    Minutes 15    METs 2.6      T5 Nustep   Level 2    SPM 80    Minutes 15    METs 2.6      Prescription Details   Frequency (times per week) 3    Duration Progress to 30 minutes of continuous aerobic without signs/symptoms of physical distress      Intensity   THRR 40-80% of Max Heartrate 105-135    Ratings of Perceived Exertion 11-13    Perceived Dyspnea 0-4      Resistance Training   Training Prescription Yes    Weight 3 lb    Reps 10-15           Perform Capillary Blood Glucose checks as needed.  Exercise Prescription Changes:  Exercise Prescription Changes    Row Name 10/29/20 1100 11/11/20 1300 11/24/20 1300         Response to Exercise   Blood Pressure (Admit) 128/78 102/62 132/64     Blood Pressure (Exercise) 152/78 148/70 142/72     Blood Pressure (Exit) 126/84 122/76 112/66     Heart Rate (Admit) 92 bpm 90 bpm 95 bpm     Heart Rate (Exercise) 128 bpm 130 bpm 129 bpm     Heart Rate (Exit) 98 bpm 96 bpm 105 bpm     Rating of Perceived Exertion (Exercise) _0 Symptoms tired on TM - did faster than RX -- --      Comments first day -- --     Duration -- Continue with 30 min of aerobic exercise without signs/symptoms of physical distress. Continue with 30 min of aerobic exercise without signs/symptoms of physical distress.     Intensity -- THRR unchanged THRR unchanged           Resistance Training   Training Prescription Yes Yes Yes     Weight 3 lb 3 lb 3 lb     Reps 10-15 10-15 10-15           Treadmill   MPH 2.3 2.5 2.5     Grade 0.5 0 0.5     Minutes _1 METs 2.76 3.09 3.09           REL-XR   Level -- 5 5     Speed -- 50 50     Minutes -- 15 15     METs -- 1.7 1.6            Exercise Comments:  Exercise Comments    Row Name 10/28/20 5701           Exercise Comments First full day of exercise!  Patient was oriented to gym and equipment including functions, settings, policies, and procedures.  Patient's individual exercise prescription and treatment plan were reviewed.  All starting workloads were established based on the results of the 6 minute walk test done at initial orientation visit.  The plan for exercise progression was also introduced and progression  will be customized based on patient's performance and goals.              Exercise Goals and Review:  Exercise Goals    Row Name 10/26/20 1059             Exercise Goals   Increase Physical Activity Yes       Intervention Provide advice, education, support and counseling about physical activity/exercise needs.;Develop an individualized exercise prescription for aerobic and resistive training based on initial evaluation findings, risk stratification, comorbidities and participant's personal goals.       Expected Outcomes Short Term: Attend rehab on a regular basis to increase amount of physical activity.;Long Term: Add in home exercise to make exercise part of routine and to increase amount of physical activity.;Long Term: Exercising regularly at least 3-5 days a week.       Increase Strength and Stamina Yes        Intervention Provide advice, education, support and counseling about physical activity/exercise needs.;Develop an individualized exercise prescription for aerobic and resistive training based on initial evaluation findings, risk stratification, comorbidities and participant's personal goals.       Expected Outcomes Short Term: Increase workloads from initial exercise prescription for resistance, speed, and METs.;Short Term: Perform resistance training exercises routinely during rehab and add in resistance training at home;Long Term: Improve cardiorespiratory fitness, muscular endurance and strength as measured by increased METs and functional capacity (6MWT)       Able to understand and use rate of perceived exertion (RPE) scale Yes       Intervention Provide education and explanation on how to use RPE scale       Expected Outcomes Short Term: Able to use RPE daily in rehab to express subjective intensity level;Long Term:  Able to use RPE to guide intensity level when exercising independently       Able to understand and use Dyspnea scale Yes       Intervention Provide education and explanation on how to use Dyspnea scale       Expected Outcomes Short Term: Able to use Dyspnea scale daily in rehab to express subjective sense of shortness of breath during exertion;Long Term: Able to use Dyspnea scale to guide intensity level when exercising independently       Knowledge and understanding of Target Heart Rate Range (THRR) Yes       Intervention Provide education and explanation of THRR including how the numbers were predicted and where they are located for reference       Expected Outcomes Short Term: Able to state/look up THRR;Short Term: Able to use daily as guideline for intensity in rehab;Long Term: Able to use THRR to govern intensity when exercising independently       Able to check pulse independently Yes       Intervention Provide education and demonstration on how to check pulse in carotid  and radial arteries.;Review the importance of being able to check your own pulse for safety during independent exercise       Expected Outcomes Short Term: Able to explain why pulse checking is important during independent exercise;Long Term: Able to check pulse independently and accurately       Understanding of Exercise Prescription Yes       Intervention Provide education, explanation, and written materials on patient's individual exercise prescription       Expected Outcomes Short Term: Able to explain program exercise prescription;Long Term: Able to explain home exercise prescription to exercise independently  Exercise Goals Re-Evaluation :  Exercise Goals Re-Evaluation    Row Name 10/28/20 0958 11/11/20 1344 11/24/20 1306         Exercise Goal Re-Evaluation   Exercise Goals Review Increase Physical Activity;Able to understand and use rate of perceived exertion (RPE) scale;Knowledge and understanding of Target Heart Rate Range (THRR);Understanding of Exercise Prescription;Increase Strength and Stamina;Able to check pulse independently Increase Physical Activity;Increase Strength and Stamina Increase Physical Activity;Increase Strength and Stamina     Comments Reviewed RPE and dyspnea scales, THR and program prescription with pt today.  Pt voiced understanding and was given a copy of goals to take home. Derrick Mckenzie has increased to 2.5 mph on TM.  He has done well on all equipment although elliptical was hard.  Staff will monitor progress. Derrick Mckenzie is tolerating exercise well in his first sessions.  He has added incline to TM . He is up to level 5 on XR.   We will monitor progress.     Expected Outcomes Short: Use RPE daily to regulate intensity. Long: Follow program prescription in THR. Short: attend consistently Long: improve overall MET level Short:  continue to exercise consistently Long:  improve stamina            Discharge Exercise Prescription (Final Exercise Prescription  Changes):  Exercise Prescription Changes - 11/24/20 1300      Response to Exercise   Blood Pressure (Admit) 132/64    Blood Pressure (Exercise) 142/72    Blood Pressure (Exit) 112/66    Heart Rate (Admit) 95 bpm    Heart Rate (Exercise) 129 bpm    Heart Rate (Exit) 105 bpm    Rating of Perceived Exertion (Exercise) 11    Duration Continue with 30 min of aerobic exercise without signs/symptoms of physical distress.    Intensity THRR unchanged      Resistance Training   Training Prescription Yes    Weight 3 lb    Reps 10-15      Treadmill   MPH 2.5    Grade 0.5    Minutes 15    METs 3.09      REL-XR   Level 5    Speed 50    Minutes 15    METs 1.6           Nutrition:  Target Goals: Understanding of nutrition guidelines, daily intake of sodium <1549m, cholesterol <2030m calories 30% from fat and 7% or less from saturated fats, daily to have 5 or more servings of fruits and vegetables.  Education: All About Nutrition: -Group instruction provided by verbal, written material, interactive activities, discussions, models, and posters to present general guidelines for heart healthy nutrition including fat, fiber, MyPlate, the role of sodium in heart healthy nutrition, utilization of the nutrition label, and utilization of this knowledge for meal planning. Follow up email sent as well. Written material given at graduation.   Biometrics:  Pre Biometrics - 10/26/20 1540      Pre Biometrics   Height 5' 8.25" (1.734 m)    Weight 294 lb 12.8 oz (133.7 kg)    BMI (Calculated) 44.47    Single Leg Stand 6.04 seconds            Nutrition Therapy Plan and Nutrition Goals:  Nutrition Therapy & Goals - 11/13/20 0955      Nutrition Therapy   Diet Heart healthy, low Na    Drug/Food Interactions Statins/Certain Fruits    Protein (specify units) 100g    Fiber 30 grams  Whole Grain Foods 3 servings    Saturated Fats 12 max. grams    Fruits and Vegetables 8 servings/day     Sodium 1.5 grams      Personal Nutrition Goals   Nutrition Goal ST: try out some of the sodium reducing tips at home, honor hunger (potentially adding heart healthy snack) LT: He would like to lose weight (He would like to be at 200 lbs) and strengthen his legs, eat 8 fruits and vegetables per day, lower sodium to 1.5g per day    Comments A1C: under 6 - metformin. B: grain or protein bar, or eggs L: variety: he only eats whole wheat bread, tuna fish, chicken cold cuts D: salmon, chicken, red meat (2x/week), starch (baked potato usually) or rice, greens (green beans, brocooli, spinach). Discussed heart healthy eating as well as diabetes friendly eating and the hunger scale. He reports using lots of salt with his foods - discussed potassium as well as some techniques to add flavor while slowly reducing sodium including citrus, vinegar, tomato paste, spices, fresh herbs.      Intervention Plan   Intervention Prescribe, educate and counsel regarding individualized specific dietary modifications aiming towards targeted core components such as weight, hypertension, lipid management, diabetes, heart failure and other comorbidities.;Nutrition handout(s) given to patient.    Expected Outcomes Short Term Goal: Understand basic principles of dietary content, such as calories, fat, sodium, cholesterol and nutrients.;Short Term Goal: A plan has been developed with personal nutrition goals set during dietitian appointment.;Long Term Goal: Adherence to prescribed nutrition plan.           Nutrition Assessments:  MEDIFICTS Score Key:  ?70 Need to make dietary changes   40-70 Heart Healthy Diet  ? 40 Therapeutic Level Cholesterol Diet  Flowsheet Row Cardiac Rehab from 10/26/2020 in [facility] Cardiac and Pulmonary Rehab  Picture Your Plate Total Score on Admission 50     Picture Your Plate Scores:  <76 Unhealthy dietary pattern with much room for improvement.  41-50 Dietary pattern unlikely to meet  recommendations for good health and room for improvement.  51-60 More healthful dietary pattern, with some room for improvement.   >60 Healthy dietary pattern, although there may be some specific behaviors that could be improved.    Nutrition Goals Re-Evaluation:   Nutrition Goals Discharge (Final Nutrition Goals Re-Evaluation):   Psychosocial: Target Goals: Acknowledge presence or absence of significant depression and/or stress, maximize coping skills, provide positive support system. Participant is able to verbalize types and ability to use techniques and skills needed for reducing stress and depression.   Education: Stress, Anxiety, and Depression - Group verbal and visual presentation to define topics covered.  Reviews how body is impacted by stress, anxiety, and depression.  Also discusses healthy ways to reduce stress and to treat/manage anxiety and depression.  Written material given at graduation.   Education: Sleep Hygiene -Provides group verbal and written instruction about how sleep can affect your health.  Define sleep hygiene, discuss sleep cycles and impact of sleep habits. Review good sleep hygiene tips.    Initial Review & Psychosocial Screening:  Initial Psych Review & Screening - 10/19/20 1115      Initial Review   Current issues with Current Sleep Concerns   Since daughter passed away in July 18, 2020 have not been sleeping well since. Has PRN med to help sleep..     Family Dynamics   Concerns Recent loss of child    Comments Daughter passed away in July 18, 2020,  Barriers   Psychosocial barriers to participate in program There are no identifiable barriers or psychosocial needs.;The patient should benefit from training in stress management and relaxation.      Screening Interventions   Interventions Encouraged to exercise;To provide support and resources with identified psychosocial needs;Provide feedback about the scores to participant    Expected Outcomes  Short Term goal: Utilizing psychosocial counselor, staff and physician to assist with identification of specific Stressors or current issues interfering with healing process. Setting desired goal for each stressor or current issue identified.;Long Term Goal: Stressors or current issues are controlled or eliminated.;Short Term goal: Identification and review with participant of any Quality of Life or Depression concerns found by scoring the questionnaire.;Long Term goal: The participant improves quality of Life and PHQ9 Scores as seen by post scores and/or verbalization of changes           Quality of Life Scores:   Quality of Life - 10/26/20 1053      Quality of Life   Select Quality of Life      Quality of Life Scores   Health/Function Pre 22.8 %    Socioeconomic Pre 19.63 %    Psych/Spiritual Pre 28.29 %    Family Pre 25.2 %    GLOBAL Pre 23.51 %          Scores of 19 and below usually indicate a poorer quality of life in these areas.  A difference of  2-3 points is a clinically meaningful difference.  A difference of 2-3 points in the total score of the Quality of Life Index has been associated with significant improvement in overall quality of life, self-image, physical symptoms, and general health in studies assessing change in quality of life.  PHQ-9: Recent Review Flowsheet Data    Depression screen [facility] 2/9 11/16/2020 10/26/2020 03/16/2017 02/13/2017 10/14/2016   Decreased Interest 1 2 0 0 0   Down, Depressed, Hopeless 2 0 0 0 0   PHQ - 2 Score 3 2 0 0 0   Altered sleeping 2 2  - - -   Tired, decreased energy 2 3  - - -   Change in appetite 2 3  - - -   Feeling bad or failure about yourself  2 0 - - -   Trouble concentrating 0 2 - - -   Moving slowly or fidgety/restless 0 0 - - -   Suicidal thoughts 0 0 - - -   PHQ-9 Score 11 12 - - -   Difficult doing work/chores Somewhat difficult Somewhat difficult - - -     Interpretation of Total Score  Total Score Depression  Severity:  1-4 = Minimal depression, 5-9 = Mild depression, 10-14 = Moderate depression, 15-19 = Moderately severe depression, 20-27 = Severe depression   Psychosocial Evaluation and Intervention:  Psychosocial Evaluation - 10/19/20 1136      Psychosocial Evaluation & Interventions   Interventions Encouraged to exercise with the program and follow exercise prescription    Comments Derrick Mckenzie is ready to start the program, he has no barriers to starting. He is ready to get back to his golf game as soon as possible. He has not exercised in some time and is overweight. He does want to lose weight and gain strength to get back to his activities. Derrick Mckenzie lives with his wife in their home. He has a son in [place] and two step children, one lives in [place] andd the other in [place]. Derrick Mckenzie 's daughter  ,  38 years old, passed away in past 07-11-23. He has not been sleeping well since then. He does have a PRN med to help for sleep.  Derrick Mckenzie stated that he had gone to visit some family and the night he returned home, he slept well. He will watch and see if this continues. He should do well with the program. He has the desire to get well and get back to his golf game and lose some weight .    Expected Outcomes STG: Derrick Mckenzie attends all scheduled sessions to be able to learn the steps to weight loss and to get stronger to get back to his golf game. LTG: Derrick Mckenzie is able to continue with his progress from program with his weight loss and getting back to his golf game.    Continue Psychosocial Services  Follow up required by staff           Psychosocial Re-Evaluation:  Psychosocial Re-Evaluation    Angelina Name 11/16/20 1005             Psychosocial Re-Evaluation   Current issues with Current Depression;History of Depression;Current Sleep Concerns       Comments Reviewed patient health questionnaire (PHQ-9) with patient for follow up. Previously, patients score indicated signs/symptoms of depression.  Reviewed to see if patient is improving symptom wise  while in program.  Score improved and patient states that it is because he has been able to be more active. He is getting to meet other people and getting out of the house.       Expected Outcomes Short: Continue to attend [facility] regularly for regular exercise and social engagement. Long: Continue to improve symptoms and manage a positive mental state.       Interventions Encouraged to attend Cardiac Rehabilitation for the exercise       Continue Psychosocial Services  Follow up required by staff              Psychosocial Discharge (Final Psychosocial Re-Evaluation):  Psychosocial Re-Evaluation - 11/16/20 1005      Psychosocial Re-Evaluation   Current issues with Current Depression;History of Depression;Current Sleep Concerns    Comments Reviewed patient health questionnaire (PHQ-9) with patient for follow up. Previously, patients score indicated signs/symptoms of depression.  Reviewed to see if patient is improving symptom wise while in program.  Score improved and patient states that it is because he has been able to be more active. He is getting to meet other people and getting out of the house.    Expected Outcomes Short: Continue to attend [facility] regularly for regular exercise and social engagement. Long: Continue to improve symptoms and manage a positive mental state.    Interventions Encouraged to attend Cardiac Rehabilitation for the exercise    Continue Psychosocial Services  Follow up required by staff           Vocational Rehabilitation: Provide vocational rehab assistance to qualifying candidates.   Vocational Rehab Evaluation & Intervention:  Vocational Rehab - 10/19/20 1125      Initial Vocational Rehab Evaluation & Intervention   Assessment shows need for Vocational Rehabilitation No           Education: Education Goals: Education classes will be provided on a variety of topics geared toward better understanding of heart health and risk factor modification.  Participant will state understanding/return demonstration of topics presented as noted by education test scores.  Learning Barriers/Preferences:  Learning Barriers/Preferences - 10/19/20 1123      Learning Barriers/Preferences  Learning Barriers None    Learning Preferences Skilled Demonstration;Verbal Instruction           General Cardiac Education Topics:  AED/CPR: - Group verbal and written instruction with the use of models to demonstrate the basic use of the AED with the basic ABC's of resuscitation.   Anatomy and Cardiac Procedures: - Group verbal and visual presentation and models provide information about basic cardiac anatomy and function. Reviews the testing methods done to diagnose heart disease and the outcomes of the test results. Describes the treatment choices: Medical Management, Angioplasty, or Coronary Bypass Surgery for treating various heart conditions including Myocardial Infarction, Angina, Valve Disease, and Cardiac Arrhythmias.  Written material given at graduation.   Medication Safety: - Group verbal and visual instruction to review commonly prescribed medications for heart and lung disease. Reviews the medication, class of the drug, and side effects. Includes the steps to properly store meds and maintain the prescription regimen.  Written material given at graduation.   Intimacy: - Group verbal instruction through game format to discuss how heart and lung disease can affect sexual intimacy. Written material given at graduation..   Know Your Numbers and Heart Failure: - Group verbal and visual instruction to discuss disease risk factors for cardiac and pulmonary disease and treatment options.  Reviews associated critical values for Overweight/Obesity, Hypertension, Cholesterol, and Diabetes.  Discusses basics of heart failure: signs/symptoms and treatments.  Introduces Heart Failure Zone chart for action plan for heart failure.  Written material given at  graduation.   Infection Prevention: - Provides verbal and written material to individual with discussion of infection control including proper hand washing and proper equipment cleaning during exercise session. Flowsheet Row Cardiac Rehab from 10/28/2020 in [facility] Cardiac and Pulmonary Rehab  Date 10/26/20  Educator AS  Instruction Review Code 1- Verbalizes Understanding      Falls Prevention: - Provides verbal and written material to individual with discussion of falls prevention and safety. Flowsheet Row Cardiac Rehab from 10/28/2020 in [facility] Cardiac and Pulmonary Rehab  Date 10/26/20  Educator AS  Instruction Review Code 1- Verbalizes Understanding      Other: -Provides group and verbal instruction on various topics (see comments)   Knowledge Questionnaire Score:   Core Components/Risk Factors/Patient Goals at Admission:  Personal Goals and Risk Factors at Admission - 10/26/20 1046      Core Components/Risk Factors/Patient Goals on Admission    Weight Management Yes;Weight Loss    Intervention Weight Management: Develop a combined nutrition and exercise program designed to reach desired caloric intake, while maintaining appropriate intake of nutrient and fiber, sodium and fats, and appropriate energy expenditure required for the weight goal.;Weight Management: Provide education and appropriate resources to help participant work on and attain dietary goals.;Obesity: Provide education and appropriate resources to help participant work on and attain dietary goals.    Admit Weight 291 lb (132 kg)    Goal Weight: Short Term 288 lb (130.6 kg)    Goal Weight: Long Term 230 lb (104.3 kg)    Expected Outcomes Short Term: Continue to assess and modify interventions until short term weight is achieved;Long Term: Adherence to nutrition and physical activity/exercise program aimed toward attainment of established weight goal;Weight Loss: Understanding of general recommendations for a balanced  deficit meal plan, which promotes 1-2 lb weight loss per week and includes a negative energy balance of [phone] kcal/d    Diabetes Yes    Intervention Provide education about signs/symptoms and action to take for hypo/hyperglycemia.;Provide  education about proper nutrition, including hydration, and aerobic/resistive exercise prescription along with prescribed medications to achieve blood glucose in normal ranges: Fasting glucose 65-99 mg/dL    Expected Outcomes Short Term: Participant verbalizes understanding of the signs/symptoms and immediate care of hyper/hypoglycemia, proper foot care and importance of medication, aerobic/resistive exercise and nutrition plan for blood glucose control.;Long Term: Attainment of HbA1C < 7%.    Lipids Yes    Intervention Provide education and support for participant on nutrition & aerobic/resistive exercise along with prescribed medications to achieve LDL <9m, HDL >451m    Expected Outcomes Short Term: Participant states understanding of desired cholesterol values and is compliant with medications prescribed. Participant is following exercise prescription and nutrition guidelines.;Long Term: Cholesterol controlled with medications as prescribed, with individualized exercise RX and with personalized nutrition plan. Value goals: LDL < 7064mHDL > 40 mg.           Education:Diabetes - Individual verbal and written instruction to review signs/symptoms of diabetes, desired ranges of glucose level fasting, after meals and with exercise. Acknowledge that pre and post exercise glucose checks will be done for 3 sessions at entry of program.   Core Components/Risk Factors/Patient Goals Review:   Goals and Risk Factor Review    Row Name 11/16/20 1000             Core Components/Risk Factors/Patient Goals Review   Personal Goals Review Weight Management/Obesity       Review Derrick Mckenzie has stated that he can take his fluid pill as needed. He has been noticing that some  days he is heavier and feeling tired. He has been taking his lasix when he thinks his weight is up due to fluid. He wants to continue with weight loss.       Expected Outcomes Short: Lose 5 in a month. Long: get to 200 pounds.              Core Components/Risk Factors/Patient Goals at Discharge (Final Review):   Goals and Risk Factor Review - 11/16/20 1000      Core Components/Risk Factors/Patient Goals Review   Personal Goals Review Weight Management/Obesity    Review Derrick Mckenzie has stated that he can take his fluid pill as needed. He has been noticing that some days he is heavier and feeling tired. He has been taking his lasix when he thinks his weight is up due to fluid. He wants to continue with weight loss.    Expected Outcomes Short: Lose 5 in a month. Long: get to 200 pounds.           ITP Comments:  ITP Comments    Row Name 10/19/20 1132 10/28/20 0912 10/28/20 0957 11/25/20 0529     ITP Comments Virtual orientation call completed today. he has an appointment on Date: 10/26/2020 for EP eval and gym Orientation.  [id number] 70y review completed. Medical Director ITP review done, changes made as directed, and signed approval by Medical Director. First full day of exercise!  Patient was oriented to gym and equipment including functions, settings, policies, and procedures.  Patient's individual exercise prescription and treatment plan were reviewed.  All starting workloads were established based on the results of the 6 minute walk test done at initial orientation visit.  The plan for exercise progression was also introduced and progression will be customized based on patient's performance and goals. 30 Day review completed. Medical Director ITP review done, changes made as directed,  and signed approval by Medical Director.           Comments:

## 2020-11-25 NOTE — Progress Notes (Signed)
Daily Session Note  Patient Details  Name: VYRON FRONCZAK MRN: [id number] Date of Birth: [birth year] Referring Provider:   Flowsheet Row Cardiac Rehab from 10/26/2020 in [facility] Cardiac and Pulmonary Rehab  Referring Provider Fath      Encounter Date: 11/25/2020  Check In:  Session Check In - 11/25/20 [place]      Check-In   Supervising physician immediately available to respond to emergencies See telemetry face sheet for immediately available ER MD    Location [facility]-Cardiac & Pulmonary Rehab    Staff Present Renita Papa, RN Vickki Hearing, BA, ACSM CEP, Exercise Physiologist;Melissa Caiola RDN, Tawanna Solo, MS Exercise Physiologist    Virtual Visit No    Medication changes reported     No    Fall or balance concerns reported    No    Warm-up and Cool-down Performed on first and last piece of equipment    Resistance Training Performed Yes    VAD Patient? No    PAD/SET Patient? No      Pain Assessment   Currently in Pain? No/denies              Social History   Tobacco Use  Smoking Status Former Smoker  . Packs/day: 0.50  . Years: 10.00  . Pack years: 5.00  . Types: Cigarettes  . Quit date: 02/03/2016  . Years since quitting: 4.8  Smokeless Tobacco Never Used  Tobacco Comment   Quit in 2017    Goals Met:  Independence with exercise equipment Exercise tolerated well No report of cardiac concerns or symptoms Strength training completed today  Goals Unmet:  Not Applicable  Comments: Pt able to follow exercise prescription today without complaint.  Will continue to monitor for progression.    Dr. Emily Filbert is Medical Director for [place] and [facility].

## 2020-12-02 ENCOUNTER — Other Ambulatory Visit: Payer: Self-pay

## 2020-12-02 ENCOUNTER — Encounter: Payer: Medicare HMO | Attending: Cardiology

## 2020-12-02 DIAGNOSIS — Z951 Presence of aortocoronary bypass graft: Secondary | ICD-10-CM | POA: Diagnosis not present

## 2020-12-02 DIAGNOSIS — Z952 Presence of prosthetic heart valve: Secondary | ICD-10-CM | POA: Diagnosis not present

## 2020-12-02 NOTE — Progress Notes (Signed)
Daily Session Note  Patient Details  Name: Derrick Mckenzie MRN: [id number] Date of Birth: 1950/12/22 Referring Provider:   Flowsheet Row Cardiac Rehab from 10/26/2020 in [facility] Cardiac and Pulmonary Rehab  Referring Provider Fath      Encounter Date: 12/02/2020  Check In:  Session Check In - 12/02/20 0945      Check-In   Supervising physician immediately available to respond to emergencies See telemetry face sheet for immediately available ER MD    Location [facility]-Cardiac & Pulmonary Rehab    Staff Present Birdie Sons, MPA, RN;Joseph Darrin Nipper, [place], RCEP, CCRP, CCET    Virtual Visit No    Medication changes reported     No    Fall or balance concerns reported    No    Warm-up and Cool-down Performed on first and last piece of equipment    Resistance Training Performed Yes    VAD Patient? No    PAD/SET Patient? No      Pain Assessment   Currently in Pain? No/denies              Social History   Tobacco Use  Smoking Status Former Smoker  . Packs/day: 0.50  . Years: 10.00  . Pack years: 5.00  . Types: Cigarettes  . Quit date: 02/03/2016  . Years since quitting: 4.8  Smokeless Tobacco Never Used  Tobacco Comment   Quit in 2017    Goals Met:  Independence with exercise equipment Exercise tolerated well No report of cardiac concerns or symptoms Strength training completed today  Goals Unmet:  Not Applicable  Comments: Pt able to follow exercise prescription today without complaint.  Will continue to monitor for progression.  Reviewed home exercise with pt today.  Pt plans to start walking at home and use staff videos for exercise.  Reviewed THR, pulse, RPE, sign and symptoms, pulse oximetery and when to call 911 or MD.  Also discussed weather considerations and indoor options.  Pt voiced understanding.     Dr. Emily Filbert is Medical Director for [place] and [facility].

## 2020-12-04 ENCOUNTER — Other Ambulatory Visit: Payer: Self-pay

## 2020-12-04 DIAGNOSIS — Z951 Presence of aortocoronary bypass graft: Secondary | ICD-10-CM | POA: Diagnosis not present

## 2020-12-04 DIAGNOSIS — Z952 Presence of prosthetic heart valve: Secondary | ICD-10-CM | POA: Diagnosis not present

## 2020-12-04 NOTE — Progress Notes (Signed)
Daily Session Note  Patient Details  Name: Derrick Mckenzie MRN: [id number] Date of Birth: Aug 22, 1951 Referring Provider:   Flowsheet Row Cardiac Rehab from 10/26/2020 in [facility] Cardiac and Pulmonary Rehab  Referring Provider Fath      Encounter Date: 12/04/2020  Check In:  Session Check In - 12/04/20 0944      Check-In   Supervising physician immediately available to respond to emergencies See telemetry face sheet for immediately available ER MD    Location [facility]-Cardiac & Pulmonary Rehab    Staff Present Birdie Sons, MPA, RN;Joseph Darrin Nipper, [place], RCEP, CCRP, CCET    Virtual Visit No    Medication changes reported     No    Fall or balance concerns reported    No    Warm-up and Cool-down Performed on first and last piece of equipment    Resistance Training Performed Yes    VAD Patient? No    PAD/SET Patient? No      Pain Assessment   Currently in Pain? No/denies              Social History   Tobacco Use  Smoking Status Former Smoker  . Packs/day: 0.50  . Years: 10.00  . Pack years: 5.00  . Types: Cigarettes  . Quit date: 02/03/2016  . Years since quitting: 4.8  Smokeless Tobacco Never Used  Tobacco Comment   Quit in 2017    Goals Met:  Independence with exercise equipment Exercise tolerated well No report of cardiac concerns or symptoms Strength training completed today  Goals Unmet:  Not Applicable  Comments: Pt able to follow exercise prescription today without complaint.  Will continue to monitor for progression.    Dr. Emily Filbert is Medical Director for [place] and [facility].

## 2020-12-07 ENCOUNTER — Other Ambulatory Visit: Payer: Self-pay

## 2020-12-07 ENCOUNTER — Encounter: Payer: Medicare HMO | Admitting: *Deleted

## 2020-12-07 DIAGNOSIS — Z951 Presence of aortocoronary bypass graft: Secondary | ICD-10-CM

## 2020-12-07 DIAGNOSIS — Z952 Presence of prosthetic heart valve: Secondary | ICD-10-CM

## 2020-12-07 NOTE — Progress Notes (Signed)
Daily Session Note  Patient Details  Name: Derrick Mckenzie MRN: [id number] Date of Birth: 03-31-1951 Referring Provider:   Flowsheet Row Cardiac Rehab from 10/26/2020 in [facility] Cardiac and Pulmonary Rehab  Referring Provider Fath      Encounter Date: 12/07/2020  Check In:      Social History   Tobacco Use  Smoking Status Former Smoker  . Packs/day: 0.50  . Years: 10.00  . Pack years: 5.00  . Types: Cigarettes  . Quit date: 02/03/2016  . Years since quitting: 4.8  Smokeless Tobacco Never Used  Tobacco Comment   Quit in 2017    Goals Met:  Independence with exercise equipment Exercise tolerated well No report of cardiac concerns or symptoms  Goals Unmet:  Not Applicable  Comments: Pt able to follow exercise prescription today without complaint.  Will continue to monitor for progression.    Dr. Emily Filbert is Medical Director for [place] and [facility].

## 2020-12-09 ENCOUNTER — Other Ambulatory Visit: Payer: Self-pay

## 2020-12-09 DIAGNOSIS — Z951 Presence of aortocoronary bypass graft: Secondary | ICD-10-CM

## 2020-12-09 DIAGNOSIS — Z952 Presence of prosthetic heart valve: Secondary | ICD-10-CM | POA: Diagnosis not present

## 2020-12-09 NOTE — Progress Notes (Signed)
Daily Session Note  Patient Details  Name: Derrick Mckenzie MRN: [id number] Date of Birth: 01/22/1951 Referring Provider:   Flowsheet Row Cardiac Rehab from 10/26/2020 in [facility] Cardiac and Pulmonary Rehab  Referring Provider Fath      Encounter Date: 12/09/2020  Check In:  Session Check In - 12/09/20 0941      Check-In   Supervising physician immediately available to respond to emergencies See telemetry face sheet for immediately available ER MD    Location [facility]-Cardiac & Pulmonary Rehab    Staff Present Birdie Sons, MPA, Elveria Rising, BA, ACSM CEP, Exercise Physiologist;Joseph Tessie Fass RCP,RRT,BSRT    Virtual Visit No    Medication changes reported     No    Fall or balance concerns reported    No    Warm-up and Cool-down Performed on first and last piece of equipment    Resistance Training Performed Yes    VAD Patient? No    PAD/SET Patient? No      Pain Assessment   Currently in Pain? No/denies              Social History   Tobacco Use  Smoking Status Former Smoker  . Packs/day: 0.50  . Years: 10.00  . Pack years: 5.00  . Types: Cigarettes  . Quit date: 02/03/2016  . Years since quitting: 4.8  Smokeless Tobacco Never Used  Tobacco Comment   Quit in 2017    Goals Met:  Independence with exercise equipment Exercise tolerated well No report of cardiac concerns or symptoms Strength training completed today  Goals Unmet:  Not Applicable  Comments: Pt able to follow exercise prescription today without complaint.  Will continue to monitor for progression.    Dr. Emily Filbert is Medical Director for [place] and [facility].

## 2020-12-11 ENCOUNTER — Other Ambulatory Visit: Payer: Self-pay

## 2020-12-11 DIAGNOSIS — Z951 Presence of aortocoronary bypass graft: Secondary | ICD-10-CM

## 2020-12-11 DIAGNOSIS — Z952 Presence of prosthetic heart valve: Secondary | ICD-10-CM

## 2020-12-11 NOTE — Progress Notes (Signed)
Daily Session Note  Patient Details  Name: Derrick Mckenzie MRN: [id number] Date of Birth: 1951/04/30 Referring Provider:   Flowsheet Row Cardiac Rehab from 10/26/2020 in [facility] Cardiac and Pulmonary Rehab  Referring Provider Fath      Encounter Date: 12/11/2020  Check In:  Session Check In - 12/11/20 0946      Check-In   Supervising physician immediately available to respond to emergencies See telemetry face sheet for immediately available ER MD    Location [facility]-Cardiac & Pulmonary Rehab    Staff Present Birdie Sons, MPA, RN;Joseph Darrin Nipper, [place], RCEP, CCRP, CCET    Virtual Visit No    Medication changes reported     No    Fall or balance concerns reported    No    Warm-up and Cool-down Performed on first and last piece of equipment    Resistance Training Performed Yes    VAD Patient? No    PAD/SET Patient? No      Pain Assessment   Currently in Pain? No/denies              Social History   Tobacco Use  Smoking Status Former Smoker  . Packs/day: 0.50  . Years: 10.00  . Pack years: 5.00  . Types: Cigarettes  . Quit date: 02/03/2016  . Years since quitting: 4.8  Smokeless Tobacco Never Used  Tobacco Comment   Quit in 2017    Goals Met:  Independence with exercise equipment Exercise tolerated well No report of cardiac concerns or symptoms Strength training completed today  Goals Unmet:  Not Applicable  Comments: Pt able to follow exercise prescription today without complaint.  Will continue to monitor for progression.    Dr. Emily Filbert is Medical Director for [place] and [facility].

## 2020-12-21 ENCOUNTER — Other Ambulatory Visit: Payer: Self-pay

## 2020-12-21 ENCOUNTER — Encounter: Payer: Medicare HMO | Admitting: *Deleted

## 2020-12-21 DIAGNOSIS — Z952 Presence of prosthetic heart valve: Secondary | ICD-10-CM

## 2020-12-21 DIAGNOSIS — Z951 Presence of aortocoronary bypass graft: Secondary | ICD-10-CM | POA: Diagnosis not present

## 2020-12-21 NOTE — Progress Notes (Signed)
Daily Session Note  Patient Details  Name: JIDENNA FIGGS MRN: [id number] Date of Birth: [birth year] Referring Provider:   Flowsheet Row Cardiac Rehab from 10/26/2020 in [facility] Cardiac and Pulmonary Rehab  Referring Provider Fath      Encounter Date: 12/21/2020  Check In:  Session Check In - 12/21/20 1043      Check-In   Supervising physician immediately available to respond to emergencies See telemetry face sheet for immediately available ER MD    Location [facility]-Cardiac & Pulmonary Rehab    Staff Present Renita Papa, RN BSN;Joseph [address] [place], MPA, Mauricia Area, BS, ACSM CEP, Exercise Physiologist    Virtual Visit No    Medication changes reported     No    Fall or balance concerns reported    No    Warm-up and Cool-down Performed on first and last piece of equipment    Resistance Training Performed Yes    VAD Patient? No    PAD/SET Patient? No      Pain Assessment   Currently in Pain? No/denies              Social History   Tobacco Use  Smoking Status Former Smoker  . Packs/day: 0.50  . Years: 10.00  . Pack years: 5.00  . Types: Cigarettes  . Quit date: 02/03/2016  . Years since quitting: 4.8  Smokeless Tobacco Never Used  Tobacco Comment   Quit in 2017    Goals Met:  Independence with exercise equipment Exercise tolerated well No report of cardiac concerns or symptoms Strength training completed today  Goals Unmet:  Not Applicable  Comments: Pt able to follow exercise prescription today without complaint.  Will continue to monitor for progression.    Dr. Emily Filbert is Medical Director for [place] and [facility].

## 2020-12-22 DIAGNOSIS — Z8673 Personal history of transient ischemic attack (TIA), and cerebral infarction without residual deficits: Secondary | ICD-10-CM | POA: Diagnosis not present

## 2020-12-22 DIAGNOSIS — E119 Type 2 diabetes mellitus without complications: Secondary | ICD-10-CM | POA: Diagnosis not present

## 2020-12-22 DIAGNOSIS — I69320 Aphasia following cerebral infarction: Secondary | ICD-10-CM | POA: Diagnosis not present

## 2020-12-22 DIAGNOSIS — G4733 Obstructive sleep apnea (adult) (pediatric): Secondary | ICD-10-CM | POA: Diagnosis not present

## 2020-12-22 DIAGNOSIS — Z6841 Body Mass Index (BMI) 40.0 and over, adult: Secondary | ICD-10-CM | POA: Diagnosis not present

## 2020-12-23 ENCOUNTER — Other Ambulatory Visit: Payer: Self-pay

## 2020-12-23 ENCOUNTER — Encounter: Payer: Self-pay | Admitting: *Deleted

## 2020-12-23 DIAGNOSIS — Z952 Presence of prosthetic heart valve: Secondary | ICD-10-CM

## 2020-12-23 DIAGNOSIS — Z951 Presence of aortocoronary bypass graft: Secondary | ICD-10-CM

## 2020-12-23 NOTE — Progress Notes (Signed)
Daily Session Note  Patient Details  Name: MATHEAU ORONA MRN: [id number] Date of Birth: 1951/11/19 Referring Provider:   Flowsheet Row Cardiac Rehab from 10/26/2020 in [facility] Cardiac and Pulmonary Rehab  Referring Provider Fath      Encounter Date: 12/23/2020  Check In:  Session Check In - 12/23/20 [place]      Check-In   Supervising physician immediately available to respond to emergencies See telemetry face sheet for immediately available ER MD    Location [facility]-Cardiac & Pulmonary Rehab    Staff Present Birdie Sons, MPA, RN;Amanda Oletta Darter, BA, ACSM CEP, Exercise Physiologist;Jessica Luan Pulling, MA, RCEP, CCRP, CCET    Virtual Visit No    Medication changes reported     No    Fall or balance concerns reported    No    Warm-up and Cool-down Performed on first and last piece of equipment    Resistance Training Performed Yes    VAD Patient? No    PAD/SET Patient? No      Pain Assessment   Currently in Pain? No/denies              Social History   Tobacco Use  Smoking Status Former Smoker  . Packs/day: 0.50  . Years: 10.00  . Pack years: 5.00  . Types: Cigarettes  . Quit date: 02/03/2016  . Years since quitting: 4.8  Smokeless Tobacco Never Used  Tobacco Comment   Quit in 2017    Goals Met:  Independence with exercise equipment Exercise tolerated well No report of cardiac concerns or symptoms Strength training completed today  Goals Unmet:  Not Applicable  Comments: Pt able to follow exercise prescription today without complaint.  Will continue to monitor for progression.    Dr. Emily Filbert is Medical Director for [place] and [facility].

## 2020-12-23 NOTE — Progress Notes (Signed)
Cardiac Individual Treatment Plan  Patient Details  Name: Derrick Mckenzie MRN: [id number] Date of Birth: 03-16-1951 Referring Provider:   Flowsheet Row Cardiac Rehab from 10/26/2020 in [facility] Cardiac and Pulmonary Rehab  Referring Provider Fath      Initial Encounter Date:  Flowsheet Row Cardiac Rehab from 10/26/2020 in [facility] Cardiac and Pulmonary Rehab  Date 10/26/20      Visit Diagnosis: S/P CABG x 2  S/P AVR (aortic valve replacement)  Patient's Home Medications on Admission:  Current Outpatient Medications:  .  albuterol (PROVENTIL HFA;VENTOLIN HFA) 108 (90 Base) MCG/ACT inhaler, Inhale 2 puffs into the lungs every 6 (six) hours as needed for wheezing or shortness of breath., Disp: 1 Inhaler, Rfl: 2 .  amLODipine (NORVASC) 5 MG tablet, Take 5 mg by mouth daily. (Patient not taking: Reported on 10/19/2020), Disp: , Rfl:  .  aspirin EC 81 MG tablet, Take 81 mg by mouth daily. Swallow whole., Disp: , Rfl:  .  atorvastatin (LIPITOR) 80 MG tablet, , Disp: , Rfl:  .  clopidogrel (PLAVIX) 75 MG tablet, Take 1 tablet (75 mg total) by mouth daily., Disp: 30 tablet, Rfl: 0 .  Fluticasone-Salmeterol (ADVAIR) 100-50 MCG/DOSE AEPB, Inhale 1 puff into the lungs 2 (two) times daily. (Patient not taking: Reported on 05/08/2020), Disp: , Rfl:  .  furosemide (LASIX) 40 MG tablet, Take 40 mg by mouth daily. , Disp: , Rfl:  .  Krill Oil 1000 MG CAPS, Take 2,000 mg by mouth 3 (three) times daily., Disp: , Rfl:  .  levothyroxine (SYNTHROID) 100 MCG tablet, Take 100 mcg by mouth daily., Disp: , Rfl:  .  metFORMIN (GLUCOPHAGE-XR) 500 MG 24 hr tablet, Take 500 mg by mouth in the morning and at bedtime., Disp: , Rfl:  .  Multiple Vitamin (MULTIVITAMIN WITH MINERALS) TABS tablet, Take 1 tablet by mouth daily. (Patient not taking: Reported on 05/08/2020), Disp: , Rfl:  .  omega-3 acid ethyl esters (LOVAZA) 1 g capsule, Take 2 g by mouth daily.  (Patient not taking: Reported on 10/19/2020), Disp: , Rfl:  .   traZODone (DESYREL) 50 MG tablet, Take by mouth., Disp: , Rfl:   Past Medical History: Past Medical History:  Diagnosis Date  . Arthritis    knees  . Complication of anesthesia    aggitation after knee surgery  . Family history of adverse reaction to anesthesia    brother - hallucinations  . GERD (gastroesophageal reflux disease)   . Hx of smoking 06/26/2015  . Hyperlipidemia   . Hypertension   . Hypothyroidism   . Kidney stone   . Morbid obesity ([place])   . Murmur   . Prediabetes 10/14/2016  . Sleep apnea    score of 5 on apnea screen    Tobacco Use: Social History   Tobacco Use  Smoking Status Former Smoker  . Packs/day: 0.50  . Years: 10.00  . Pack years: 5.00  . Types: Cigarettes  . Quit date: 02/03/2016  . Years since quitting: 4.8  Smokeless Tobacco Never Used  Tobacco Comment   Quit in 2017    Labs: Recent Review Flowsheet Data    Labs for ITP Cardiac and Pulmonary Rehab Latest Ref Rng & Units 08/05/2016 09/27/2016 02/13/2017 02/26/2020 02/27/2020   Cholestrol 0 - 200 mg/dL - 182 171 - 137   LDLCALC 0 - 99 mg/dL - 95 88 - 62   HDL >40 mg/dL - 29(L) 29(L) - 28(L)   Trlycerides <150 mg/dL - 291(H) 269(H) -  237(H)   Hemoglobin A1c 4.8 - 5.6 % 5.8(H) - 5.6 6.3(H) 6.2(H)       Exercise Target Goals: Exercise Program Goal: Individual exercise prescription set using results from initial 6 min walk test and THRR while considering  patient's activity barriers and safety.   Exercise Prescription Goal: Initial exercise prescription builds to 30-45 minutes a day of aerobic activity, 2-3 days per week.  Home exercise guidelines will be given to patient during program as part of exercise prescription that the participant will acknowledge.   Education: Aerobic Exercise: - Group verbal and visual presentation on the components of exercise prescription. Introduces F.I.T.T principle from ACSM for exercise prescriptions.  Reviews F.I.T.T. principles of aerobic exercise including  progression. Written material given at graduation.   Education: Resistance Exercise: - Group verbal and visual presentation on the components of exercise prescription. Introduces F.I.T.T principle from ACSM for exercise prescriptions  Reviews F.I.T.T. principles of resistance exercise including progression. Written material given at graduation.    Education: Exercise & Equipment Safety: - Individual verbal instruction and demonstration of equipment use and safety with use of the equipment. Flowsheet Row Cardiac Rehab from 12/09/2020 in [facility] Cardiac and Pulmonary Rehab  Date 10/26/20  Educator AS  Instruction Review Code 1- Verbalizes Understanding      Education: Exercise Physiology & General Exercise Guidelines: - Group verbal and written instruction with models to review the exercise physiology of the cardiovascular system and associated critical values. Provides general exercise guidelines with specific guidelines to those with heart or lung disease.    Education: Flexibility, Balance, Mind/Body Relaxation: - Group verbal and visual presentation with interactive activity on the components of exercise prescription. Introduces F.I.T.T principle from ACSM for exercise prescriptions. Reviews F.I.T.T. principles of flexibility and balance exercise training including progression. Also discusses the mind body connection.  Reviews various relaxation techniques to help reduce and manage stress (i.e. Deep breathing, progressive muscle relaxation, and visualization). Balance handout provided to take home. Written material given at graduation.   Activity Barriers & Risk Stratification:  Activity Barriers & Cardiac Risk Stratification - 10/19/20 1114      Activity Barriers & Cardiac Risk Stratification   Activity Barriers None    Cardiac Risk Stratification High           6 Minute Walk:  6 Minute Walk    Row Name 10/26/20 1035         6 Minute Walk   Phase Initial     Distance 1205  feet     Walk Time 6 minutes     # of Rest Breaks 0     MPH 2.28     METS 2.6     RPE 10     Perceived Dyspnea  1     VO2 Peak 9.2     Symptoms No     Resting HR 77 bpm     Resting BP 152/78     Resting Oxygen Saturation  94 %     Exercise Oxygen Saturation  during 6 min walk 93 %     Max Ex. HR 137 bpm     Max Ex. BP 188/86     2 Minute Post BP 150/88            Oxygen Initial Assessment:   Oxygen Re-Evaluation:   Oxygen Discharge (Final Oxygen Re-Evaluation):   Initial Exercise Prescription:  Initial Exercise Prescription - 10/26/20 1000      Date of Initial Exercise RX and Referring  Provider   Date 10/26/20    Referring Provider Fath      Treadmill   MPH 2    Grade 0.5    Minutes 15    METs 2.6      Elliptical   Level 1    Speed 3    Minutes 15    METs 2.6      REL-XR   Level 1    Speed 50    Minutes 15    METs 2.6      T5 Nustep   Level 2    SPM 80    Minutes 15    METs 2.6      Prescription Details   Frequency (times per week) 3    Duration Progress to 30 minutes of continuous aerobic without signs/symptoms of physical distress      Intensity   THRR 40-80% of Max Heartrate 105-135    Ratings of Perceived Exertion 11-13    Perceived Dyspnea 0-4      Resistance Training   Training Prescription Yes    Weight 3 lb    Reps 10-15           Perform Capillary Blood Glucose checks as needed.  Exercise Prescription Changes:  Exercise Prescription Changes    Row Name 10/29/20 1100 11/11/20 1300 11/24/20 1300 12/02/20 1000 12/07/20 1100     Response to Exercise   Blood Pressure (Admit) 128/78 102/62 132/64 -- 142/72   Blood Pressure (Exercise) 152/78 148/70 142/72 -- 136/70   Blood Pressure (Exit) 126/84 122/76 112/66 -- 126/70   Heart Rate (Admit) 92 bpm 90 bpm 95 bpm -- 90 bpm   Heart Rate (Exercise) 128 bpm 130 bpm 129 bpm -- 121 bpm   Heart Rate (Exit) 98 bpm 96 bpm 105 bpm -- 92 bpm   Rating of Perceived Exertion (Exercise) _0 -- 12   Symptoms tired on TM - did faster than RX -- -- -- --   Comments first day -- -- -- --   Duration -- Continue with 30 min of aerobic exercise without signs/symptoms of physical distress. Continue with 30 min of aerobic exercise without signs/symptoms of physical distress. -- Continue with 30 min of aerobic exercise without signs/symptoms of physical distress.   Intensity -- THRR unchanged THRR unchanged -- THRR unchanged     Progression   Progression -- -- -- -- Continue to progress workloads to maintain intensity without signs/symptoms of physical distress.   Average METs -- -- -- -- 2.7     Resistance Training   Training Prescription Yes Yes Yes -- Yes   Weight 3 lb 3 lb 3 lb -- 6 lb   Reps 10-15 10-15 10-15 -- 10-15     Treadmill   MPH 2.3 2.5 2.5 -- 2.5   Grade 0.5 0 0.5 -- 0.5   Minutes _1 -- 15   METs 2.76 3.09 3.09 -- 3.09     REL-XR   Level -- 5 5 -- --   Speed -- 50 50 -- --   Minutes -- 15 15 -- --   METs -- 1.7 1.6 -- --     T5 Nustep   Level -- -- -- -- 3   SPM -- -- -- -- 80   Minutes -- -- -- -- 15   METs -- -- -- -- 2.3     Home Exercise Plan   Plans to continue exercise at -- -- -- Home (comment)  start walking and using staff videos Home (comment)  start walking and using staff videos   Frequency -- -- -- Add 2 additional days to program exercise sessions. Add 2 additional days to program exercise sessions.   Initial Home Exercises Provided -- -- -- 12/02/20 12/02/20          Exercise Comments:  Exercise Comments    Row Name 10/28/20 [id number]           Exercise Comments First full day of exercise!  Patient was oriented to gym and equipment including functions, settings, policies, and procedures.  Patient's individual exercise prescription and treatment plan were reviewed.  All starting workloads were established based on the results of the 6 minute walk test done at initial orientation visit.  The plan for exercise progression was also  introduced and progression will be customized based on patient's performance and goals.              Exercise Goals and Review:  Exercise Goals    Row Name 10/26/20 1059             Exercise Goals   Increase Physical Activity Yes       Intervention Provide advice, education, support and counseling about physical activity/exercise needs.;Develop an individualized exercise prescription for aerobic and resistive training based on initial evaluation findings, risk stratification, comorbidities and participant's personal goals.       Expected Outcomes Short Term: Attend rehab on a regular basis to increase amount of physical activity.;Long Term: Add in home exercise to make exercise part of routine and to increase amount of physical activity.;Long Term: Exercising regularly at least 3-5 days a week.       Increase Strength and Stamina Yes       Intervention Provide advice, education, support and counseling about physical activity/exercise needs.;Develop an individualized exercise prescription for aerobic and resistive training based on initial evaluation findings, risk stratification, comorbidities and participant's personal goals.       Expected Outcomes Short Term: Increase workloads from initial exercise prescription for resistance, speed, and METs.;Short Term: Perform resistance training exercises routinely during rehab and add in resistance training at home;Long Term: Improve cardiorespiratory fitness, muscular endurance and strength as measured by increased METs and functional capacity (6MWT)       Able to understand and use rate of perceived exertion (RPE) scale Yes       Intervention Provide education and explanation on how to use RPE scale       Expected Outcomes Short Term: Able to use RPE daily in rehab to express subjective intensity level;Long Term:  Able to use RPE to guide intensity level when exercising independently       Able to understand and use Dyspnea scale Yes        Intervention Provide education and explanation on how to use Dyspnea scale       Expected Outcomes Short Term: Able to use Dyspnea scale daily in rehab to express subjective sense of shortness of breath during exertion;Long Term: Able to use Dyspnea scale to guide intensity level when exercising independently       Knowledge and understanding of Target Heart Rate Range (THRR) Yes       Intervention Provide education and explanation of THRR including how the numbers were predicted and where they are located for reference       Expected Outcomes Short Term: Able to state/look up THRR;Short Term: Able to use daily as guideline for intensity in rehab;Long Term:  Able to use THRR to govern intensity when exercising independently       Able to check pulse independently Yes       Intervention Provide education and demonstration on how to check pulse in carotid and radial arteries.;Review the importance of being able to check your own pulse for safety during independent exercise       Expected Outcomes Short Term: Able to explain why pulse checking is important during independent exercise;Long Term: Able to check pulse independently and accurately       Understanding of Exercise Prescription Yes       Intervention Provide education, explanation, and written materials on patient's individual exercise prescription       Expected Outcomes Short Term: Able to explain program exercise prescription;Long Term: Able to explain home exercise prescription to exercise independently              Exercise Goals Re-Evaluation :  Exercise Goals Re-Evaluation    Row Name 10/28/20 0958 11/11/20 1344 11/24/20 1306 12/02/20 1016 12/02/20 1029     Exercise Goal Re-Evaluation   Exercise Goals Review Increase Physical Activity;Able to understand and use rate of perceived exertion (RPE) scale;Knowledge and understanding of Target Heart Rate Range (THRR);Understanding of Exercise Prescription;Increase Strength and Stamina;Able  to check pulse independently Increase Physical Activity;Increase Strength and Stamina Increase Physical Activity;Increase Strength and Stamina Increase Physical Activity;Increase Strength and Stamina Increase Physical Activity;Able to understand and use rate of perceived exertion (RPE) scale;Knowledge and understanding of Target Heart Rate Range (THRR);Understanding of Exercise Prescription;Increase Strength and Stamina;Able to understand and use Dyspnea scale;Able to check pulse independently   Comments Reviewed RPE and dyspnea scales, THR and program prescription with pt today.  Pt voiced understanding and was given a copy of goals to take home. Ed has increased to 2.5 mph on TM.  He has done well on all equipment although elliptical was hard.  Staff will monitor progress. Ed is tolerating exercise well in his first sessions.  He has added incline to TM . He is up to level 5 on XR.   We will monitor progress. He will do push ups on kitchen counter 25 2x/day. EP to talk about home exercise with Ed. Reviewed home exercise with pt today.  Pt plans to start walking at home and use staff videos for exercise.  Reviewed THR, pulse, RPE, sign and symptoms, pulse oximetery and when to call 911 or MD.  Also discussed weather considerations and indoor options.  Pt voiced understanding.   Expected Outcomes Short: Use RPE daily to regulate intensity. Long: Follow program prescription in THR. Short: attend consistently Long: improve overall MET level Short:  continue to exercise consistently Long:  improve stamina ST: attend rehab consistently LT: 150 minutes of moderate activity and 2 days of weight training. Short: start walking and using staff videos    Long: continue to exercise at home   [place] Name 12/07/20 1137 12/07/20 1138           Exercise Goal Re-Evaluation   Exercise Goals Review Increase Physical Activity;Increase Strength and Stamina --      Comments Ed is progressing well with exercise.  He has increased  to 6 lb for strength training. He works in [employer] range.  Staff will monitor progress.      Expected Outcomes -- Short:  continue to exercise consistently Long:  continue to improve stamina             Discharge Exercise Prescription (Final Exercise Prescription  Changes):  Exercise Prescription Changes - 12/07/20 1100      Response to Exercise   Blood Pressure (Admit) 142/72    Blood Pressure (Exercise) 136/70    Blood Pressure (Exit) 126/70    Heart Rate (Admit) 90 bpm    Heart Rate (Exercise) 121 bpm    Heart Rate (Exit) 92 bpm    Rating of Perceived Exertion (Exercise) 12    Duration Continue with 30 min of aerobic exercise without signs/symptoms of physical distress.    Intensity THRR unchanged      Progression   Progression Continue to progress workloads to maintain intensity without signs/symptoms of physical distress.    Average METs 2.7      Resistance Training   Training Prescription Yes    Weight 6 lb    Reps 10-15      Treadmill   MPH 2.5    Grade 0.5    Minutes 15    METs 3.09      T5 Nustep   Level 3    SPM 80    Minutes 15    METs 2.3      Home Exercise Plan   Plans to continue exercise at Home (comment)   start walking and using staff videos   Frequency Add 2 additional days to program exercise sessions.    Initial Home Exercises Provided 12/02/20           Nutrition:  Target Goals: Understanding of nutrition guidelines, daily intake of sodium '1500mg'$ , cholesterol '200mg'$ , calories 30% from fat and 7% or less from saturated fats, daily to have 5 or more servings of fruits and vegetables.  Education: All About Nutrition: -Group instruction provided by verbal, written material, interactive activities, discussions, models, and posters to present general guidelines for heart healthy nutrition including fat, fiber, MyPlate, the role of sodium in heart healthy nutrition, utilization of the nutrition label, and utilization of this knowledge for meal  planning. Follow up email sent as well. Written material given at graduation. Flowsheet Row Cardiac Rehab from 12/09/2020 in [facility] Cardiac and Pulmonary Rehab  Date 12/09/20  Educator [facility]  Instruction Review Code 1- Verbalizes Understanding      Biometrics:  Pre Biometrics - 10/26/20 1540      Pre Biometrics   Height 5' 8.25" (1.734 m)    Weight 294 lb 12.8 oz (133.7 kg)    BMI (Calculated) 44.47    Single Leg Stand 6.04 seconds            Nutrition Therapy Plan and Nutrition Goals:  Nutrition Therapy & Goals - 11/13/20 0955      Nutrition Therapy   Diet Heart healthy, low Na    Drug/Food Interactions Statins/Certain Fruits    Protein (specify units) 100g    Fiber 30 grams    Whole Grain Foods 3 servings    Saturated Fats 12 max. grams    Fruits and Vegetables 8 servings/day    Sodium 1.5 grams      Personal Nutrition Goals   Nutrition Goal ST: try out some of the sodium reducing tips at home, honor hunger (potentially adding heart healthy snack) LT: He would like to lose weight (He would like to be at 200 lbs) and strengthen his legs, eat 8 fruits and vegetables per day, lower sodium to 1.5g per day    Comments A1C: under 6 - metformin. B: grain or protein bar, or eggs L: variety: he only eats whole wheat bread, tuna fish, chicken cold  cuts D: salmon, chicken, red meat (2x/week), starch (baked potato usually) or rice, greens (green beans, brocooli, spinach). Discussed heart healthy eating as well as diabetes friendly eating and the hunger scale. He reports using lots of salt with his foods - discussed potassium as well as some techniques to add flavor while slowly reducing sodium including citrus, vinegar, tomato paste, spices, fresh herbs.      Intervention Plan   Intervention Prescribe, educate and counsel regarding individualized specific dietary modifications aiming towards targeted core components such as weight, hypertension, lipid management, diabetes, heart failure and  other comorbidities.;Nutrition handout(s) given to patient.    Expected Outcomes Short Term Goal: Understand basic principles of dietary content, such as calories, fat, sodium, cholesterol and nutrients.;Short Term Goal: A plan has been developed with personal nutrition goals set during dietitian appointment.;Long Term Goal: Adherence to prescribed nutrition plan.           Nutrition Assessments:  MEDIFICTS Score Key:  ?70 Need to make dietary changes   40-70 Heart Healthy Diet  ? 40 Therapeutic Level Cholesterol Diet  Flowsheet Row Cardiac Rehab from 10/26/2020 in [facility] Cardiac and Pulmonary Rehab  Picture Your Plate Total Score on Admission 50     Picture Your Plate Scores:  <41 Unhealthy dietary pattern with much room for improvement.  41-50 Dietary pattern unlikely to meet recommendations for good health and room for improvement.  51-60 More healthful dietary pattern, with some room for improvement.   >60 Healthy dietary pattern, although there may be some specific behaviors that could be improved.    Nutrition Goals Re-Evaluation:  Nutrition Goals Re-Evaluation    [place] Name 12/02/20 1004             Goals   Nutrition Goal ST: try out some of the sodium reducing tips at home, honor hunger LT: He would like to lose weight (He would like to be at 200 lbs) and strengthen his legs, eat 8 fruits and vegetables per day, lower sodium to 1.5g per day       Comment He reports he is reducing sodium, but his wife doesn't feel it is enough. He has not really tried any of the tips yet. He has still been trying not to eat after dinner, because he would like to lose weight - talked about importance of honoring hunger and eating enough. Sometimes if he really needs to eat he will have some cereal.       Expected Outcome ST: try out some of the sodium reducing tips at home, honor hunger LT: He would like to lose weight (He would like to be at 200 lbs) and strengthen his legs, eat 8 fruits  and vegetables per day, lower sodium to 1.5g per day              Nutrition Goals Discharge (Final Nutrition Goals Re-Evaluation):  Nutrition Goals Re-Evaluation - 12/02/20 1004      Goals   Nutrition Goal ST: try out some of the sodium reducing tips at home, honor hunger LT: He would like to lose weight (He would like to be at 200 lbs) and strengthen his legs, eat 8 fruits and vegetables per day, lower sodium to 1.5g per day    Comment He reports he is reducing sodium, but his wife doesn't feel it is enough. He has not really tried any of the tips yet. He has still been trying not to eat after dinner, because he would like to lose weight - talked  about importance of honoring hunger and eating enough. Sometimes if he really needs to eat he will have some cereal.    Expected Outcome ST: try out some of the sodium reducing tips at home, honor hunger LT: He would like to lose weight (He would like to be at 200 lbs) and strengthen his legs, eat 8 fruits and vegetables per day, lower sodium to 1.5g per day           Psychosocial: Target Goals: Acknowledge presence or absence of significant depression and/or stress, maximize coping skills, provide positive support system. Participant is able to verbalize types and ability to use techniques and skills needed for reducing stress and depression.   Education: Stress, Anxiety, and Depression - Group verbal and visual presentation to define topics covered.  Reviews how body is impacted by stress, anxiety, and depression.  Also discusses healthy ways to reduce stress and to treat/manage anxiety and depression.  Written material given at graduation.   Education: Sleep Hygiene -Provides group verbal and written instruction about how sleep can affect your health.  Define sleep hygiene, discuss sleep cycles and impact of sleep habits. Review good sleep hygiene tips.    Initial Review & Psychosocial Screening:  Initial Psych Review & Screening - 10/19/20  1115      Initial Review   Current issues with Current Sleep Concerns   Since daughter passed away in 2020/08/02 have not been sleeping well since. Has PRN med to help sleep..     Family Dynamics   Concerns Recent loss of child    Comments Daughter passed away in 08/02/2020,      Barriers   Psychosocial barriers to participate in program There are no identifiable barriers or psychosocial needs.;The patient should benefit from training in stress management and relaxation.      Screening Interventions   Interventions Encouraged to exercise;To provide support and resources with identified psychosocial needs;Provide feedback about the scores to participant    Expected Outcomes Short Term goal: Utilizing psychosocial counselor, staff and physician to assist with identification of specific Stressors or current issues interfering with healing process. Setting desired goal for each stressor or current issue identified.;Long Term Goal: Stressors or current issues are controlled or eliminated.;Short Term goal: Identification and review with participant of any Quality of Life or Depression concerns found by scoring the questionnaire.;Long Term goal: The participant improves quality of Life and PHQ9 Scores as seen by post scores and/or verbalization of changes           Quality of Life Scores:   Quality of Life - 10/26/20 1053      Quality of Life   Select Quality of Life      Quality of Life Scores   Health/Function Pre 22.8 %    Socioeconomic Pre 19.63 %    Psych/Spiritual Pre 28.29 %    Family Pre 25.2 %    GLOBAL Pre 23.51 %          Scores of 19 and below usually indicate a poorer quality of life in these areas.  A difference of  2-3 points is a clinically meaningful difference.  A difference of 2-3 points in the total score of the Quality of Life Index has been associated with significant improvement in overall quality of life, self-image, physical symptoms, and general health in  studies assessing change in quality of life.  PHQ-9: Recent Review Flowsheet Data    Depression screen [facility] 2/9 11/16/2020 10/26/2020 03/16/2017 02/13/2017 10/14/2016  Decreased Interest 1 2 0 0 0   Down, Depressed, Hopeless 2 0 0 0 0   PHQ - 2 Score 3 2 0 0 0   Altered sleeping 2 2  - - -   Tired, decreased energy 2 3  - - -   Change in appetite 2 3  - - -   Feeling bad or failure about yourself  2 0 - - -   Trouble concentrating 0 2 - - -   Moving slowly or fidgety/restless 0 0 - - -   Suicidal thoughts 0 0 - - -   PHQ-9 Score 11 12 - - -   Difficult doing work/chores Somewhat difficult Somewhat difficult - - -     Interpretation of Total Score  Total Score Depression Severity:  1-4 = Minimal depression, 5-9 = Mild depression, 10-14 = Moderate depression, 15-19 = Moderately severe depression, 20-27 = Severe depression   Psychosocial Evaluation and Intervention:  Psychosocial Evaluation - 10/19/20 1136      Psychosocial Evaluation & Interventions   Interventions Encouraged to exercise with the program and follow exercise prescription    Comments Ed is ready to start the program, he has no barriers to starting. He is ready to get back to his golf game as soon as possible. He has not exercised in some time and is overweight. He does want to lose weight and gain strength to get back to his activities. Ed lives with his wife in their home. He has a son in [place] and two step children, one lives in [place] andd the other in [place]. Ed 's daughter  ,25 years old, passed away in past 07-13-2023. He has not been sleeping well since then. He does have a PRN med to help for sleep.  Ed stated that he had gone to visit some family and the night he returned home, he slept well. He will watch and see if this continues. He should do well with the program. He has the desire to get well and get back to his golf game and lose some weight .    Expected Outcomes STG: Ed attends all scheduled sessions to be able to learn  the steps to weight loss and to get stronger to get back to his golf game. LTG: Ed is able to continue with his progress from program with his weight loss and getting back to his golf game.    Continue Psychosocial Services  Follow up required by staff           Psychosocial Re-Evaluation:  Psychosocial Re-Evaluation    [place] Name 11/16/20 1005 12/02/20 1008           Psychosocial Re-Evaluation   Current issues with Current Depression;History of Depression;Current Sleep Concerns History of Depression;Current Stress Concerns      Comments Reviewed patient health questionnaire (PHQ-9) with patient for follow up. Previously, patients score indicated signs/symptoms of depression.  Reviewed to see if patient is improving symptom wise while in program.  Score improved and patient states that it is because he has been able to be more active. He is getting to meet other people and getting out of the house. He reports having depressive symptoms due to his daughter dying, which is causing him stress and anger becuase they still don't know how it has happened. He reports being happy at home. He reports that he hasn't been sleeping and he has now gotten a medication for sleep, but it still not sleeping. He  still reports having low energy since surgery. He will sometimes go golfing to get out of the house, suggested also getting out in nature. He reports having a good support system in his wife, but he doesn't have anyone else close.      Expected Outcomes Short: Continue to attend [facility] regularly for regular exercise and social engagement. Long: Continue to improve symptoms and manage a positive mental state. Short: Continue to attend [facility] regularly for regular exercise and social engagement, try going out into nature on a greenwayLong: Continue to improve symptoms and manage a positive mental state.      Interventions Encouraged to attend Cardiac Rehabilitation for the exercise Encouraged to attend  Cardiac Rehabilitation for the exercise      Continue Psychosocial Services  Follow up required by staff Follow up required by staff             Initial Review   Source of Stress Concerns -- Family             Psychosocial Discharge (Final Psychosocial Re-Evaluation):  Psychosocial Re-Evaluation - 12/02/20 1008      Psychosocial Re-Evaluation   Current issues with History of Depression;Current Stress Concerns    Comments He reports having depressive symptoms due to his daughter dying, which is causing him stress and anger becuase they still don't know how it has happened. He reports being happy at home. He reports that he hasn't been sleeping and he has now gotten a medication for sleep, but it still not sleeping. He still reports having low energy since surgery. He will sometimes go golfing to get out of the house, suggested also getting out in nature. He reports having a good support system in his wife, but he doesn't have anyone else close.    Expected Outcomes Short: Continue to attend [facility] regularly for regular exercise and social engagement, try going out into nature on a greenwayLong: Continue to improve symptoms and manage a positive mental state.    Interventions Encouraged to attend Cardiac Rehabilitation for the exercise    Continue Psychosocial Services  Follow up required by staff      Initial Review   Source of Stress Concerns Family           Vocational Rehabilitation: Provide vocational rehab assistance to qualifying candidates.   Vocational Rehab Evaluation & Intervention:  Vocational Rehab - 10/19/20 1125      Initial Vocational Rehab Evaluation & Intervention   Assessment shows need for Vocational Rehabilitation No           Education: Education Goals: Education classes will be provided on a variety of topics geared toward better understanding of heart health and risk factor modification. Participant will state understanding/return demonstration of  topics presented as noted by education test scores.  Learning Barriers/Preferences:  Learning Barriers/Preferences - 10/19/20 1123      Learning Barriers/Preferences   Learning Barriers None    Learning Preferences Skilled Demonstration;Verbal Instruction           General Cardiac Education Topics:  AED/CPR: - Group verbal and written instruction with the use of models to demonstrate the basic use of the AED with the basic ABC's of resuscitation.   Anatomy and Cardiac Procedures: - Group verbal and visual presentation and models provide information about basic cardiac anatomy and function. Reviews the testing methods done to diagnose heart disease and the outcomes of the test results. Describes the treatment choices: Medical Management, Angioplasty, or Coronary Bypass Surgery for treating various  heart conditions including Myocardial Infarction, Angina, Valve Disease, and Cardiac Arrhythmias.  Written material given at graduation.   Medication Safety: - Group verbal and visual instruction to review commonly prescribed medications for heart and lung disease. Reviews the medication, class of the drug, and side effects. Includes the steps to properly store meds and maintain the prescription regimen.  Written material given at graduation.   Intimacy: - Group verbal instruction through game format to discuss how heart and lung disease can affect sexual intimacy. Written material given at graduation..   Know Your Numbers and Heart Failure: - Group verbal and visual instruction to discuss disease risk factors for cardiac and pulmonary disease and treatment options.  Reviews associated critical values for Overweight/Obesity, Hypertension, Cholesterol, and Diabetes.  Discusses basics of heart failure: signs/symptoms and treatments.  Introduces Heart Failure Zone chart for action plan for heart failure.  Written material given at graduation.   Infection Prevention: - Provides verbal and  written material to individual with discussion of infection control including proper hand washing and proper equipment cleaning during exercise session. Flowsheet Row Cardiac Rehab from 12/09/2020 in [facility] Cardiac and Pulmonary Rehab  Date 10/26/20  Educator AS  Instruction Review Code 1- Verbalizes Understanding      Falls Prevention: - Provides verbal and written material to individual with discussion of falls prevention and safety. Flowsheet Row Cardiac Rehab from 12/09/2020 in [facility] Cardiac and Pulmonary Rehab  Date 10/26/20  Educator AS  Instruction Review Code 1- Verbalizes Understanding      Other: -Provides group and verbal instruction on various topics (see comments)   Knowledge Questionnaire Score:   Core Components/Risk Factors/Patient Goals at Admission:  Personal Goals and Risk Factors at Admission - 10/26/20 1046      Core Components/Risk Factors/Patient Goals on Admission    Weight Management Yes;Weight Loss    Intervention Weight Management: Develop a combined nutrition and exercise program designed to reach desired caloric intake, while maintaining appropriate intake of nutrient and fiber, sodium and fats, and appropriate energy expenditure required for the weight goal.;Weight Management: Provide education and appropriate resources to help participant work on and attain dietary goals.;Obesity: Provide education and appropriate resources to help participant work on and attain dietary goals.    Admit Weight 291 lb (132 kg)    Goal Weight: Short Term 288 lb (130.6 kg)    Goal Weight: Long Term 230 lb (104.3 kg)    Expected Outcomes Short Term: Continue to assess and modify interventions until short term weight is achieved;Long Term: Adherence to nutrition and physical activity/exercise program aimed toward attainment of established weight goal;Weight Loss: Understanding of general recommendations for a balanced deficit meal plan, which promotes 1-2 lb weight loss per week  and includes a negative energy balance of [phone] kcal/d    Diabetes Yes    Intervention Provide education about signs/symptoms and action to take for hypo/hyperglycemia.;Provide education about proper nutrition, including hydration, and aerobic/resistive exercise prescription along with prescribed medications to achieve blood glucose in normal ranges: Fasting glucose 65-99 mg/dL    Expected Outcomes Short Term: Participant verbalizes understanding of the signs/symptoms and immediate care of hyper/hypoglycemia, proper foot care and importance of medication, aerobic/resistive exercise and nutrition plan for blood glucose control.;Long Term: Attainment of HbA1C < 7%.    Lipids Yes    Intervention Provide education and support for participant on nutrition & aerobic/resistive exercise along with prescribed medications to achieve LDL '70mg'$ , HDL >$Remo'40mg'WEIZU$ .    Expected Outcomes Short Term: Participant states  understanding of desired cholesterol values and is compliant with medications prescribed. Participant is following exercise prescription and nutrition guidelines.;Long Term: Cholesterol controlled with medications as prescribed, with individualized exercise RX and with personalized nutrition plan. Value goals: LDL < 44m, HDL > 40 mg.           Education:Diabetes - Individual verbal and written instruction to review signs/symptoms of diabetes, desired ranges of glucose level fasting, after meals and with exercise. Acknowledge that pre and post exercise glucose checks will be done for 3 sessions at entry of program.   Core Components/Risk Factors/Patient Goals Review:   Goals and Risk Factor Review    Row Name 11/16/20 1000 12/02/20 1018           Core Components/Risk Factors/Patient Goals Review   Personal Goals Review Weight Management/Obesity Weight Management/Obesity      Review Ed has stated that he can take his fluid pill as needed. He has been noticing that some days he is heavier and  feeling tired. He has been taking his lasix when he thinks his weight is up due to fluid. He wants to continue with weight loss. Ed has stated that he can take his fluid pill as needed. He has been noticing that he is still feeling tired. He has been taking his lasix when he thinks his weight is up due to fluid. He wants to continue with weight loss - he reports losing some weight since coming and his was 320lbs at the beginning of his heart problems.      Expected Outcomes Short: Lose 5 in a month. Long: get to 200 pounds. Short: continue to exercise at rehab, exercise at home, make dietary changes. Long: get to 200 pounds.             Core Components/Risk Factors/Patient Goals at Discharge (Final Review):   Goals and Risk Factor Review - 12/02/20 1018      Core Components/Risk Factors/Patient Goals Review   Personal Goals Review Weight Management/Obesity    Review Ed has stated that he can take his fluid pill as needed. He has been noticing that he is still feeling tired. He has been taking his lasix when he thinks his weight is up due to fluid. He wants to continue with weight loss - he reports losing some weight since coming and his was 320lbs at the beginning of his heart problems.    Expected Outcomes Short: continue to exercise at rehab, exercise at home, make dietary changes. Long: get to 200 pounds.           ITP Comments:  ITP Comments    Row Name 10/19/20 1132 10/28/20 0912 10/28/20 0957 11/25/20 0529 12/23/20 0855   ITP Comments Virtual orientation call completed today. he has an appointment on Date: 10/26/2020 for EP eval and gym Orientation.  [id number] 34Day review completed. Medical Director ITP review done, changes made as directed, and signed approval by Medical Director. First full day of exercise!  Patient was oriented to gym and equipment including functions, settings, policies, and procedures.  Patient's individual  exercise prescription and treatment plan were reviewed.  All starting workloads were established based on the results of the 6 minute walk test done at initial orientation visit.  The plan for exercise progression was also introduced and progression will be customized based on patient's performance and goals. 30 Day review completed. Medical Director ITP review done, changes made as directed, and  signed approval by Medical Director. 30 Day review completed. Medical Director ITP review done, changes made as directed, and signed approval by Medical Director.          Comments:

## 2020-12-25 ENCOUNTER — Other Ambulatory Visit: Payer: Self-pay

## 2020-12-25 DIAGNOSIS — Z952 Presence of prosthetic heart valve: Secondary | ICD-10-CM

## 2020-12-25 DIAGNOSIS — Z951 Presence of aortocoronary bypass graft: Secondary | ICD-10-CM

## 2020-12-25 NOTE — Progress Notes (Signed)
Daily Session Note  Patient Details  Name: Derrick Mckenzie MRN: [id number] Date of Birth: 1951-11-18 Referring Provider:   Flowsheet Row Cardiac Rehab from 10/26/2020 in [facility] Cardiac and Pulmonary Rehab  Referring Provider Fath      Encounter Date: 12/25/2020  Check In:  Session Check In - 12/25/20 0945      Check-In   Supervising physician immediately available to respond to emergencies See telemetry face sheet for immediately available ER MD    Location [facility]-Cardiac & Pulmonary Rehab    Staff Present Birdie Sons, MPA, RN;Joseph Darrin Nipper, [place], RCEP, CCRP, CCET    Virtual Visit No    Medication changes reported     No    Fall or balance concerns reported    No    Warm-up and Cool-down Performed on first and last piece of equipment    Resistance Training Performed Yes    VAD Patient? No    PAD/SET Patient? No      Pain Assessment   Currently in Pain? No/denies              Social History   Tobacco Use  Smoking Status Former Smoker  . Packs/day: 0.50  . Years: 10.00  . Pack years: 5.00  . Types: Cigarettes  . Quit date: 02/03/2016  . Years since quitting: 4.8  Smokeless Tobacco Never Used  Tobacco Comment   Quit in 2017    Goals Met:  Independence with exercise equipment Exercise tolerated well No report of cardiac concerns or symptoms Strength training completed today  Goals Unmet:  Not Applicable  Comments: Pt able to follow exercise prescription today without complaint.  Will continue to monitor for progression.    Dr. Emily Filbert is Medical Director for [place] and [facility].

## 2020-12-28 ENCOUNTER — Other Ambulatory Visit: Payer: Self-pay

## 2020-12-28 ENCOUNTER — Encounter: Payer: Medicare HMO | Admitting: *Deleted

## 2020-12-28 DIAGNOSIS — Z951 Presence of aortocoronary bypass graft: Secondary | ICD-10-CM

## 2020-12-28 DIAGNOSIS — Z952 Presence of prosthetic heart valve: Secondary | ICD-10-CM

## 2020-12-28 NOTE — Progress Notes (Signed)
Daily Session Note  Patient Details  Name: Derrick Mckenzie MRN: [id number] Date of Birth: 27-Aug-1951 Referring Provider:   Flowsheet Row Cardiac Rehab from 10/26/2020 in [name]  Referring Provider Fath      Encounter Date: 12/28/2020  Check In:  Session Check In - 12/28/20 1037      Check-In   Supervising physician immediately available to respond to emergencies See telemetry face sheet for immediately available ER MD    Location [facility]-Cardiac & Pulmonary Rehab    Staff Present Renita Papa, RN BSN;Joseph [address] [place], [place], ACSM CEP, Exercise Physiologist    Virtual Visit No    Medication changes reported     No    Fall or balance concerns reported    No    Warm-up and Cool-down Performed on first and last piece of equipment    Resistance Training Performed Yes    VAD Patient? No    PAD/SET Patient? No      Pain Assessment   Currently in Pain? No/denies              Social History   Tobacco Use  Smoking Status Former Smoker  . Packs/day: 0.50  . Years: 10.00  . Pack years: 5.00  . Types: Cigarettes  . Quit date: 02/03/2016  . Years since quitting: 4.9  Smokeless Tobacco Never Used  Tobacco Comment   Quit in 2017    Goals Met:  Independence with exercise equipment Exercise tolerated well No report of cardiac concerns or symptoms Strength training completed today  Goals Unmet:  Not Applicable  Comments: Pt able to follow exercise prescription today without complaint.  Will continue to monitor for progression.    Dr. Emily Filbert is Medical Director for [place] and [facility].

## 2020-12-30 ENCOUNTER — Other Ambulatory Visit: Payer: Self-pay

## 2020-12-30 ENCOUNTER — Encounter: Payer: Medicare HMO | Attending: Cardiology

## 2020-12-30 DIAGNOSIS — I2510 Atherosclerotic heart disease of native coronary artery without angina pectoris: Secondary | ICD-10-CM | POA: Insufficient documentation

## 2020-12-30 DIAGNOSIS — Z952 Presence of prosthetic heart valve: Secondary | ICD-10-CM

## 2020-12-30 DIAGNOSIS — Z951 Presence of aortocoronary bypass graft: Secondary | ICD-10-CM | POA: Insufficient documentation

## 2020-12-30 DIAGNOSIS — I350 Nonrheumatic aortic (valve) stenosis: Secondary | ICD-10-CM | POA: Diagnosis not present

## 2020-12-30 DIAGNOSIS — Z955 Presence of coronary angioplasty implant and graft: Secondary | ICD-10-CM | POA: Insufficient documentation

## 2020-12-30 NOTE — Progress Notes (Signed)
Daily Session Note  Patient Details  Name: Derrick Mckenzie MRN: [id number] Date of Birth: 1951-11-25 Referring Provider:   Flowsheet Row Cardiac Rehab from 10/26/2020 in [facility] Cardiac and Pulmonary Rehab  Referring Provider Fath      Encounter Date: 12/30/2020  Check In:  Session Check In - 12/30/20 1002      Check-In   Supervising physician immediately available to respond to emergencies See telemetry face sheet for immediately available ER MD    Location [facility]-Cardiac & Pulmonary Rehab    Staff Present Birdie Sons, MPA, Elveria Rising, BA, ACSM CEP, Exercise Physiologist;Joseph Tessie Fass RCP,RRT,BSRT    Virtual Visit No    Medication changes reported     No    Fall or balance concerns reported    No    Warm-up and Cool-down Performed on first and last piece of equipment    Resistance Training Performed Yes    VAD Patient? No    PAD/SET Patient? No      Pain Assessment   Currently in Pain? No/denies              Social History   Tobacco Use  Smoking Status Former Smoker  . Packs/day: 0.50  . Years: 10.00  . Pack years: 5.00  . Types: Cigarettes  . Quit date: 02/03/2016  . Years since quitting: 4.9  Smokeless Tobacco Never Used  Tobacco Comment   Quit in 2017    Goals Met:  Independence with exercise equipment Exercise tolerated well No report of cardiac concerns or symptoms Strength training completed today  Goals Unmet:  Not Applicable  Comments: Pt able to follow exercise prescription today without complaint.  Will continue to monitor for progression.    Dr. Emily Filbert is Medical Director for [place] and [facility].

## 2021-01-01 ENCOUNTER — Other Ambulatory Visit: Payer: Self-pay

## 2021-01-01 DIAGNOSIS — Z951 Presence of aortocoronary bypass graft: Secondary | ICD-10-CM | POA: Diagnosis not present

## 2021-01-01 DIAGNOSIS — I2510 Atherosclerotic heart disease of native coronary artery without angina pectoris: Secondary | ICD-10-CM | POA: Diagnosis not present

## 2021-01-01 DIAGNOSIS — Z955 Presence of coronary angioplasty implant and graft: Secondary | ICD-10-CM | POA: Diagnosis not present

## 2021-01-01 DIAGNOSIS — Z952 Presence of prosthetic heart valve: Secondary | ICD-10-CM

## 2021-01-01 DIAGNOSIS — I350 Nonrheumatic aortic (valve) stenosis: Secondary | ICD-10-CM | POA: Diagnosis not present

## 2021-01-01 NOTE — Progress Notes (Signed)
Daily Session Note  Patient Details  Name: Derrick Mckenzie MRN: [id number] Date of Birth: Jan 30, 1951 Referring Provider:   Flowsheet Row Cardiac Rehab from 10/26/2020 in [facility] Cardiac and Pulmonary Rehab  Referring Provider Fath      Encounter Date: 01/01/2021  Check In:  Session Check In - 01/01/21 0936      Check-In   Supervising physician immediately available to respond to emergencies See telemetry face sheet for immediately available ER MD    Location [facility]-Cardiac & Pulmonary Rehab    Staff Present Birdie Sons, MPA, RN;Joseph Darrin Nipper, [place], RCEP, CCRP, CCET    Virtual Visit No    Medication changes reported     No    Fall or balance concerns reported    No    Warm-up and Cool-down Performed on first and last piece of equipment    Resistance Training Performed Yes    VAD Patient? No    PAD/SET Patient? No      Pain Assessment   Currently in Pain? No/denies              Social History   Tobacco Use  Smoking Status Former Smoker  . Packs/day: 0.50  . Years: 10.00  . Pack years: 5.00  . Types: Cigarettes  . Quit date: 02/03/2016  . Years since quitting: 4.9  Smokeless Tobacco Never Used  Tobacco Comment   Quit in 2017    Goals Met:  Independence with exercise equipment Exercise tolerated well No report of cardiac concerns or symptoms Strength training completed today  Goals Unmet:  Not Applicable  Comments: Pt able to follow exercise prescription today without complaint.  Will continue to monitor for progression.    Dr. Emily Filbert is Medical Director for [place] and [facility].

## 2021-01-04 ENCOUNTER — Other Ambulatory Visit: Payer: Self-pay

## 2021-01-04 ENCOUNTER — Encounter: Payer: Medicare HMO | Admitting: *Deleted

## 2021-01-04 DIAGNOSIS — Z952 Presence of prosthetic heart valve: Secondary | ICD-10-CM

## 2021-01-04 DIAGNOSIS — Z951 Presence of aortocoronary bypass graft: Secondary | ICD-10-CM | POA: Diagnosis not present

## 2021-01-04 DIAGNOSIS — I2510 Atherosclerotic heart disease of native coronary artery without angina pectoris: Secondary | ICD-10-CM | POA: Diagnosis not present

## 2021-01-04 DIAGNOSIS — Z955 Presence of coronary angioplasty implant and graft: Secondary | ICD-10-CM | POA: Diagnosis not present

## 2021-01-04 DIAGNOSIS — I350 Nonrheumatic aortic (valve) stenosis: Secondary | ICD-10-CM | POA: Diagnosis not present

## 2021-01-04 NOTE — Progress Notes (Signed)
Daily Session Note  Patient Details  Name: Derrick Mckenzie MRN: [id number] Date of Birth: Apr 12, 1951 Referring Provider:   Flowsheet Row Cardiac Rehab from 10/26/2020 in [facility] Cardiac and Pulmonary Rehab  Referring Provider Fath      Encounter Date: 01/04/2021  Check In:  Session Check In - 01/04/21 1042      Check-In   Supervising physician immediately available to respond to emergencies See telemetry face sheet for immediately available ER MD    Location [facility]-Cardiac & Pulmonary Rehab    Staff Present Heath Lark, RN, BSN, Laveda Norman, BS, ACSM CEP, Exercise Physiologist;Joseph Tessie Fass RCP,RRT,BSRT    Virtual Visit No    Medication changes reported     No    Fall or balance concerns reported    No    Warm-up and Cool-down Performed on first and last piece of equipment    Resistance Training Performed Yes    VAD Patient? No    PAD/SET Patient? No      Pain Assessment   Currently in Pain? No/denies              Social History   Tobacco Use  Smoking Status Former Smoker  . Packs/day: 0.50  . Years: 10.00  . Pack years: 5.00  . Types: Cigarettes  . Quit date: 02/03/2016  . Years since quitting: 4.9  Smokeless Tobacco Never Used  Tobacco Comment   Quit in 2017    Goals Met:  Independence with exercise equipment Exercise tolerated well No report of cardiac concerns or symptoms  Goals Unmet:  Not Applicable  Comments: Pt able to follow exercise prescription today without complaint.  Will continue to monitor for progression.    Dr. Emily Filbert is Medical Director for [place] and [facility].

## 2021-01-05 DIAGNOSIS — I723 Aneurysm of iliac artery: Secondary | ICD-10-CM | POA: Diagnosis not present

## 2021-01-05 DIAGNOSIS — Z6841 Body Mass Index (BMI) 40.0 and over, adult: Secondary | ICD-10-CM | POA: Diagnosis not present

## 2021-01-05 DIAGNOSIS — Z952 Presence of prosthetic heart valve: Secondary | ICD-10-CM | POA: Diagnosis not present

## 2021-01-05 DIAGNOSIS — I10 Essential (primary) hypertension: Secondary | ICD-10-CM | POA: Diagnosis not present

## 2021-01-05 DIAGNOSIS — I639 Cerebral infarction, unspecified: Secondary | ICD-10-CM | POA: Diagnosis not present

## 2021-01-05 DIAGNOSIS — Z951 Presence of aortocoronary bypass graft: Secondary | ICD-10-CM | POA: Diagnosis not present

## 2021-01-05 DIAGNOSIS — I5032 Chronic diastolic (congestive) heart failure: Secondary | ICD-10-CM | POA: Diagnosis not present

## 2021-01-05 DIAGNOSIS — I25708 Atherosclerosis of coronary artery bypass graft(s), unspecified, with other forms of angina pectoris: Secondary | ICD-10-CM | POA: Diagnosis not present

## 2021-01-05 DIAGNOSIS — E782 Mixed hyperlipidemia: Secondary | ICD-10-CM | POA: Diagnosis not present

## 2021-01-06 ENCOUNTER — Other Ambulatory Visit: Payer: Self-pay

## 2021-01-06 DIAGNOSIS — Z951 Presence of aortocoronary bypass graft: Secondary | ICD-10-CM | POA: Diagnosis not present

## 2021-01-06 DIAGNOSIS — Z952 Presence of prosthetic heart valve: Secondary | ICD-10-CM

## 2021-01-06 DIAGNOSIS — I350 Nonrheumatic aortic (valve) stenosis: Secondary | ICD-10-CM | POA: Diagnosis not present

## 2021-01-06 DIAGNOSIS — I2510 Atherosclerotic heart disease of native coronary artery without angina pectoris: Secondary | ICD-10-CM | POA: Diagnosis not present

## 2021-01-06 DIAGNOSIS — Z955 Presence of coronary angioplasty implant and graft: Secondary | ICD-10-CM | POA: Diagnosis not present

## 2021-01-06 NOTE — Progress Notes (Signed)
Daily Session Note  Patient Details  Name: Derrick Mckenzie MRN: [id number] Date of Birth: Jul 12, 1951 Referring Provider:   Flowsheet Row Cardiac Rehab from 10/26/2020 in [facility] Cardiac and Pulmonary Rehab  Referring Provider Fath      Encounter Date: 01/06/2021  Check In:  Session Check In - 01/06/21 0939      Check-In   Supervising physician immediately available to respond to emergencies See telemetry face sheet for immediately available ER MD    Location [facility]-Cardiac & Pulmonary Rehab    Staff Present Birdie Sons, MPA, Elveria Rising, BA, ACSM CEP, Exercise Physiologist;Joseph Tessie Fass RCP,RRT,BSRT    Virtual Visit No    Medication changes reported     No    Fall or balance concerns reported    No    Warm-up and Cool-down Performed on first and last piece of equipment    Resistance Training Performed Yes    VAD Patient? No    PAD/SET Patient? No      Pain Assessment   Currently in Pain? No/denies              Social History   Tobacco Use  Smoking Status Former Smoker  . Packs/day: 0.50  . Years: 10.00  . Pack years: 5.00  . Types: Cigarettes  . Quit date: 02/03/2016  . Years since quitting: 4.9  Smokeless Tobacco Never Used  Tobacco Comment   Quit in 2017    Goals Met:  Independence with exercise equipment Exercise tolerated well No report of cardiac concerns or symptoms Strength training completed today  Goals Unmet:  Not Applicable  Comments: Pt able to follow exercise prescription today without complaint.  Will continue to monitor for progression.    Dr. Emily Filbert is Medical Director for [employer] and [facility].

## 2021-01-08 ENCOUNTER — Other Ambulatory Visit: Payer: Self-pay

## 2021-01-08 DIAGNOSIS — Z952 Presence of prosthetic heart valve: Secondary | ICD-10-CM

## 2021-01-08 DIAGNOSIS — I350 Nonrheumatic aortic (valve) stenosis: Secondary | ICD-10-CM | POA: Diagnosis not present

## 2021-01-08 DIAGNOSIS — Z955 Presence of coronary angioplasty implant and graft: Secondary | ICD-10-CM | POA: Diagnosis not present

## 2021-01-08 DIAGNOSIS — Z951 Presence of aortocoronary bypass graft: Secondary | ICD-10-CM

## 2021-01-08 DIAGNOSIS — I2510 Atherosclerotic heart disease of native coronary artery without angina pectoris: Secondary | ICD-10-CM | POA: Diagnosis not present

## 2021-01-08 NOTE — Progress Notes (Signed)
Daily Session Note  Patient Details  Name: Derrick Mckenzie MRN: [id number] Date of Birth: 1951-11-14 Referring Provider:   Flowsheet Row Cardiac Rehab from 10/26/2020 in [facility] Cardiac and Pulmonary Rehab  Referring Provider Fath      Encounter Date: 01/08/2021  Check In:  Session Check In - 01/08/21 0956      Check-In   Supervising physician immediately available to respond to emergencies See telemetry face sheet for immediately available ER MD    Location [facility]-Cardiac & Pulmonary Rehab    Staff Present Birdie Sons, MPA, RN;Joseph Darrin Nipper, [place], RCEP, CCRP, CCET    Virtual Visit No    Medication changes reported     No    Fall or balance concerns reported    No    Warm-up and Cool-down Performed on first and last piece of equipment    Resistance Training Performed Yes    VAD Patient? No    PAD/SET Patient? No      Pain Assessment   Currently in Pain? No/denies              Social History   Tobacco Use  Smoking Status Former Smoker  . Packs/day: 0.50  . Years: 10.00  . Pack years: 5.00  . Types: Cigarettes  . Quit date: 02/03/2016  . Years since quitting: 4.9  Smokeless Tobacco Never Used  Tobacco Comment   Quit in 2017    Goals Met:  Independence with exercise equipment Exercise tolerated well No report of cardiac concerns or symptoms Strength training completed today  Goals Unmet:  Not Applicable  Comments: Pt able to follow exercise prescription today without complaint.  Will continue to monitor for progression.    Dr. Emily Filbert is Medical Director for [place] and [facility].

## 2021-01-11 ENCOUNTER — Encounter: Payer: Medicare HMO | Admitting: *Deleted

## 2021-01-11 ENCOUNTER — Other Ambulatory Visit: Payer: Self-pay

## 2021-01-11 DIAGNOSIS — I2510 Atherosclerotic heart disease of native coronary artery without angina pectoris: Secondary | ICD-10-CM | POA: Diagnosis not present

## 2021-01-11 DIAGNOSIS — Z955 Presence of coronary angioplasty implant and graft: Secondary | ICD-10-CM | POA: Diagnosis not present

## 2021-01-11 DIAGNOSIS — Z951 Presence of aortocoronary bypass graft: Secondary | ICD-10-CM | POA: Diagnosis not present

## 2021-01-11 DIAGNOSIS — Z952 Presence of prosthetic heart valve: Secondary | ICD-10-CM

## 2021-01-11 DIAGNOSIS — I350 Nonrheumatic aortic (valve) stenosis: Secondary | ICD-10-CM | POA: Diagnosis not present

## 2021-01-11 NOTE — Progress Notes (Signed)
Daily Session Note  Patient Details  Name: JAMOL GINYARD MRN: [id number] Date of Birth: May 09, 1951 Referring Provider:   Flowsheet Row Cardiac Rehab from 10/26/2020 in [facility] Cardiac and Pulmonary Rehab  Referring Provider Fath      Encounter Date: 01/11/2021  Check In:  Session Check In - 01/11/21 1033      Check-In   Supervising physician immediately available to respond to emergencies See telemetry face sheet for immediately available ER MD    Location [facility]-Cardiac & Pulmonary Rehab    Staff Present Renita Papa, RN BSN;Joseph [address] [place], [place], ACSM CEP, Exercise Physiologist    Virtual Visit No    Medication changes reported     No    Fall or balance concerns reported    No    Warm-up and Cool-down Performed on first and last piece of equipment    Resistance Training Performed Yes    VAD Patient? No    PAD/SET Patient? No      Pain Assessment   Currently in Pain? No/denies              Social History   Tobacco Use  Smoking Status Former Smoker  . Packs/day: 0.50  . Years: 10.00  . Pack years: 5.00  . Types: Cigarettes  . Quit date: 02/03/2016  . Years since quitting: 4.9  Smokeless Tobacco Never Used  Tobacco Comment   Quit in 2017    Goals Met:  Independence with exercise equipment Exercise tolerated well No report of cardiac concerns or symptoms Strength training completed today  Goals Unmet:  Not Applicable  Comments: Pt able to follow exercise prescription today without complaint.  Will continue to monitor for progression.    Dr. Emily Filbert is Medical Director for [place] and [facility].

## 2021-01-13 ENCOUNTER — Other Ambulatory Visit: Payer: Self-pay

## 2021-01-13 DIAGNOSIS — Z951 Presence of aortocoronary bypass graft: Secondary | ICD-10-CM

## 2021-01-13 DIAGNOSIS — Z952 Presence of prosthetic heart valve: Secondary | ICD-10-CM

## 2021-01-13 DIAGNOSIS — I2510 Atherosclerotic heart disease of native coronary artery without angina pectoris: Secondary | ICD-10-CM | POA: Diagnosis not present

## 2021-01-13 DIAGNOSIS — I350 Nonrheumatic aortic (valve) stenosis: Secondary | ICD-10-CM | POA: Diagnosis not present

## 2021-01-13 DIAGNOSIS — Z955 Presence of coronary angioplasty implant and graft: Secondary | ICD-10-CM | POA: Diagnosis not present

## 2021-01-13 NOTE — Progress Notes (Signed)
Daily Session Note  Patient Details  Name: Derrick Mckenzie MRN: [id number] Date of Birth: 20-Sep-1951 Referring Provider:   Flowsheet Row Cardiac Rehab from 10/26/2020 in [facility] Cardiac and Pulmonary Rehab  Referring Provider Fath      Encounter Date: 01/13/2021  Check In:  Session Check In - 01/13/21 1037      Check-In   Supervising physician immediately available to respond to emergencies See telemetry face sheet for immediately available ER MD    Location [facility]-Cardiac & Pulmonary Rehab    Staff Present Birdie Sons, MPA, Elveria Rising, BA, ACSM CEP, Exercise Physiologist;Joseph Tessie Fass RCP,RRT,BSRT    Virtual Visit No    Medication changes reported     No    Fall or balance concerns reported    No    Warm-up and Cool-down Performed on first and last piece of equipment    Resistance Training Performed Yes    VAD Patient? No    PAD/SET Patient? No      Pain Assessment   Currently in Pain? No/denies              Social History   Tobacco Use  Smoking Status Former Smoker  . Packs/day: 0.50  . Years: 10.00  . Pack years: 5.00  . Types: Cigarettes  . Quit date: 02/03/2016  . Years since quitting: 4.9  Smokeless Tobacco Never Used  Tobacco Comment   Quit in 2017    Goals Met:  Independence with exercise equipment Exercise tolerated well No report of cardiac concerns or symptoms Strength training completed today  Goals Unmet:  Not Applicable  Comments: Pt able to follow exercise prescription today without complaint.  Will continue to monitor for progression.    Dr. Emily Filbert is Medical Director for [place] and [facility].

## 2021-01-15 ENCOUNTER — Other Ambulatory Visit: Payer: Self-pay

## 2021-01-15 VITALS — Ht 68.25 in | Wt 290.1 lb

## 2021-01-15 DIAGNOSIS — Z951 Presence of aortocoronary bypass graft: Secondary | ICD-10-CM | POA: Diagnosis not present

## 2021-01-15 DIAGNOSIS — Z952 Presence of prosthetic heart valve: Secondary | ICD-10-CM

## 2021-01-15 DIAGNOSIS — I350 Nonrheumatic aortic (valve) stenosis: Secondary | ICD-10-CM | POA: Diagnosis not present

## 2021-01-15 DIAGNOSIS — I2510 Atherosclerotic heart disease of native coronary artery without angina pectoris: Secondary | ICD-10-CM | POA: Diagnosis not present

## 2021-01-15 DIAGNOSIS — Z955 Presence of coronary angioplasty implant and graft: Secondary | ICD-10-CM | POA: Diagnosis not present

## 2021-01-15 NOTE — Progress Notes (Signed)
Daily Session Note  Patient Details  Name: ZEKE AKER MRN: [id number] Date of Birth: [birth year] Referring Provider:   Flowsheet Row Cardiac Rehab from 10/26/2020 in [facility] Cardiac and Pulmonary Rehab  Referring Provider Fath      Encounter Date: 01/15/2021  Check In:  Session Check In - 01/15/21 0949      Check-In   Supervising physician immediately available to respond to emergencies See telemetry face sheet for immediately available ER MD    Location [facility]-Cardiac & Pulmonary Rehab    Staff Present Birdie Sons, MPA, RN;Joseph Darrin Nipper, [place], RCEP, CCRP, CCET    Virtual Visit No    Medication changes reported     No    Fall or balance concerns reported    No    Warm-up and Cool-down Performed on first and last piece of equipment    Resistance Training Performed Yes    VAD Patient? No    PAD/SET Patient? No      Pain Assessment   Currently in Pain? No/denies              Social History   Tobacco Use  Smoking Status Former Smoker  . Packs/day: 0.50  . Years: 10.00  . Pack years: 5.00  . Types: Cigarettes  . Quit date: 02/03/2016  . Years since quitting: 4.9  Smokeless Tobacco Never Used  Tobacco Comment   Quit in 2017    Goals Met:  Independence with exercise equipment Exercise tolerated well No report of cardiac concerns or symptoms Strength training completed today  Goals Unmet:  Not Applicable  Comments: Pt able to follow exercise prescription today without complaint.  Will continue to monitor for progression.   [place] Name 10/26/20 1035 01/15/21 1000       6 Minute Walk   Phase Initial Discharge    Distance 1205 feet 1355 feet    Distance % Change -- 12.4 %    Distance Feet Change -- 150 ft    Walk Time 6 minutes 6 minutes    # of Rest Breaks 0 0    MPH 2.28 2.56    METS 2.6 2.64    RPE 10 15    Perceived Dyspnea  1 --    VO2 Peak 9.2 9.26    Symptoms No Yes (comment)    Comments -- fatigue     Resting HR 77 bpm 80 bpm    Resting BP 152/78 132/78    Resting Oxygen Saturation  94 % --    Exercise Oxygen Saturation  during 6 min walk 93 % --    Max Ex. HR 137 bpm 133 bpm    Max Ex. BP 188/86 166/74    2 Minute Post BP 150/88 --           Dr. Emily Filbert is Medical Director for [place] and [facility].

## 2021-01-18 ENCOUNTER — Other Ambulatory Visit: Payer: Self-pay

## 2021-01-18 ENCOUNTER — Encounter: Payer: Medicare HMO | Admitting: *Deleted

## 2021-01-18 DIAGNOSIS — Z951 Presence of aortocoronary bypass graft: Secondary | ICD-10-CM | POA: Diagnosis not present

## 2021-01-18 DIAGNOSIS — I2510 Atherosclerotic heart disease of native coronary artery without angina pectoris: Secondary | ICD-10-CM | POA: Diagnosis not present

## 2021-01-18 DIAGNOSIS — Z955 Presence of coronary angioplasty implant and graft: Secondary | ICD-10-CM | POA: Diagnosis not present

## 2021-01-18 DIAGNOSIS — I350 Nonrheumatic aortic (valve) stenosis: Secondary | ICD-10-CM | POA: Diagnosis not present

## 2021-01-18 DIAGNOSIS — Z952 Presence of prosthetic heart valve: Secondary | ICD-10-CM

## 2021-01-18 NOTE — Progress Notes (Signed)
Daily Session Note  Patient Details  Name: Derrick Mckenzie MRN: [id number] Date of Birth: 1950/12/20 Referring Provider:   Flowsheet Row Cardiac Rehab from 10/26/2020 in [facility] Cardiac and Pulmonary Rehab  Referring Provider Fath      Encounter Date: 01/18/2021  Check In:  Session Check In - 01/18/21 1045      Check-In   Supervising physician immediately available to respond to emergencies See telemetry face sheet for immediately available ER MD    Location [facility]-Cardiac & Pulmonary Rehab    Staff Present Renita Papa, RN BSN;Joseph [address] [place], [place], ACSM CEP, Exercise Physiologist    Virtual Visit No    Medication changes reported     No    Fall or balance concerns reported    No    Warm-up and Cool-down Performed on first and last piece of equipment    Resistance Training Performed Yes    VAD Patient? No    PAD/SET Patient? No      Pain Assessment   Currently in Pain? No/denies              Social History   Tobacco Use  Smoking Status Former Smoker  . Packs/day: 0.50  . Years: 10.00  . Pack years: 5.00  . Types: Cigarettes  . Quit date: 02/03/2016  . Years since quitting: 4.9  Smokeless Tobacco Never Used  Tobacco Comment   Quit in 2017    Goals Met:  Independence with exercise equipment Exercise tolerated well No report of cardiac concerns or symptoms Strength training completed today  Goals Unmet:  Not Applicable  Comments: Pt able to follow exercise prescription today without complaint.  Will continue to monitor for progression.    Dr. Emily Filbert is Medical Director for [place] and [facility].

## 2021-01-20 ENCOUNTER — Other Ambulatory Visit: Payer: Self-pay

## 2021-01-20 ENCOUNTER — Encounter: Payer: Self-pay | Admitting: *Deleted

## 2021-01-20 DIAGNOSIS — Z951 Presence of aortocoronary bypass graft: Secondary | ICD-10-CM

## 2021-01-20 DIAGNOSIS — I350 Nonrheumatic aortic (valve) stenosis: Secondary | ICD-10-CM | POA: Diagnosis not present

## 2021-01-20 DIAGNOSIS — Z952 Presence of prosthetic heart valve: Secondary | ICD-10-CM

## 2021-01-20 DIAGNOSIS — Z955 Presence of coronary angioplasty implant and graft: Secondary | ICD-10-CM | POA: Diagnosis not present

## 2021-01-20 DIAGNOSIS — I2510 Atherosclerotic heart disease of native coronary artery without angina pectoris: Secondary | ICD-10-CM | POA: Diagnosis not present

## 2021-01-20 NOTE — Progress Notes (Signed)
Cardiac Individual Treatment Plan  Patient Details  Name: Derrick Mckenzie MRN: [id number] Date of Birth: [birth year] Referring Provider:   Flowsheet Row Cardiac Rehab from 10/26/2020 in [facility] Cardiac and Pulmonary Rehab  Referring Provider Fath      Initial Encounter Date:  Flowsheet Row Cardiac Rehab from 10/26/2020 in [facility] Cardiac and Pulmonary Rehab  Date 10/26/20      Visit Diagnosis: S/P CABG x 2  S/P AVR (aortic valve replacement)  Patient's Home Medications on Admission:  Current Outpatient Medications:  .  albuterol (PROVENTIL HFA;VENTOLIN HFA) 108 (90 Base) MCG/ACT inhaler, Inhale 2 puffs into the lungs every 6 (six) hours as needed for wheezing or shortness of breath., Disp: 1 Inhaler, Rfl: 2 .  amLODipine (NORVASC) 5 MG tablet, Take 5 mg by mouth daily. (Patient not taking: Reported on 10/19/2020), Disp: , Rfl:  .  aspirin EC 81 MG tablet, Take 81 mg by mouth daily. Swallow whole., Disp: , Rfl:  .  atorvastatin (LIPITOR) 80 MG tablet, , Disp: , Rfl:  .  clopidogrel (PLAVIX) 75 MG tablet, Take 1 tablet (75 mg total) by mouth daily., Disp: 30 tablet, Rfl: 0 .  Fluticasone-Salmeterol (ADVAIR) 100-50 MCG/DOSE AEPB, Inhale 1 puff into the lungs 2 (two) times daily. (Patient not taking: Reported on 05/08/2020), Disp: , Rfl:  .  furosemide (LASIX) 40 MG tablet, Take 40 mg by mouth daily. , Disp: , Rfl:  .  Krill Oil 1000 MG CAPS, Take 2,000 mg by mouth 3 (three) times daily., Disp: , Rfl:  .  levothyroxine (SYNTHROID) 100 MCG tablet, Take 100 mcg by mouth daily., Disp: , Rfl:  .  metFORMIN (GLUCOPHAGE-XR) 500 MG 24 hr tablet, Take 500 mg by mouth in the morning and at bedtime., Disp: , Rfl:  .  Multiple Vitamin (MULTIVITAMIN WITH MINERALS) TABS tablet, Take 1 tablet by mouth daily. (Patient not taking: Reported on 05/08/2020), Disp: , Rfl:  .  omega-3 acid ethyl esters (LOVAZA) 1 g capsule, Take 2 g by mouth daily.  (Patient not taking: Reported on 10/19/2020), Disp: , Rfl:  .   traZODone (DESYREL) 50 MG tablet, Take by mouth., Disp: , Rfl:   Past Medical History: Past Medical History:  Diagnosis Date  . Arthritis    knees  . Complication of anesthesia    aggitation after knee surgery  . Family history of adverse reaction to anesthesia    brother - hallucinations  . GERD (gastroesophageal reflux disease)   . Hx of smoking 06/26/2015  . Hyperlipidemia   . Hypertension   . Hypothyroidism   . Kidney stone   . Morbid obesity ([place])   . Murmur   . Prediabetes 10/14/2016  . Sleep apnea    score of 5 on apnea screen    Tobacco Use: Social History   Tobacco Use  Smoking Status Former Smoker  . Packs/day: 0.50  . Years: 10.00  . Pack years: 5.00  . Types: Cigarettes  . Quit date: 02/03/2016  . Years since quitting: 4.9  Smokeless Tobacco Never Used  Tobacco Comment   Quit in 2017    Labs: Recent Review Flowsheet Data    Labs for ITP Cardiac and Pulmonary Rehab Latest Ref Rng & Units 08/05/2016 09/27/2016 02/13/2017 02/26/2020 02/27/2020   Cholestrol 0 - 200 mg/dL - 182 171 - 137   LDLCALC 0 - 99 mg/dL - 95 88 - 62   HDL >40 mg/dL - 29(L) 29(L) - 28(L)   Trlycerides <150 mg/dL - 291(H) 269(H) -  237(H)   Hemoglobin A1c 4.8 - 5.6 % 5.8(H) - 5.6 6.3(H) 6.2(H)       Exercise Target Goals: Exercise Program Goal: Individual exercise prescription set using results from initial 6 min walk test and THRR while considering  patient's activity barriers and safety.   Exercise Prescription Goal: Initial exercise prescription builds to 30-45 minutes a day of aerobic activity, 2-3 days per week.  Home exercise guidelines will be given to patient during program as part of exercise prescription that the participant will acknowledge.   Education: Aerobic Exercise: - Group verbal and visual presentation on the components of exercise prescription. Introduces F.I.T.T principle from ACSM for exercise prescriptions.  Reviews F.I.T.T. principles of aerobic exercise including  progression. Written material given at graduation. Flowsheet Row Cardiac Rehab from 01/13/2021 in [facility] Cardiac and Pulmonary Rehab  Date 01/13/21  Educator jh  Instruction Review Code 1- Verbalizes Understanding      Education: Resistance Exercise: - Group verbal and visual presentation on the components of exercise prescription. Introduces F.I.T.T principle from ACSM for exercise prescriptions  Reviews F.I.T.T. principles of resistance exercise including progression. Written material given at graduation.    Education: Exercise & Equipment Safety: - Individual verbal instruction and demonstration of equipment use and safety with use of the equipment. Flowsheet Row Cardiac Rehab from 01/13/2021 in [facility] Cardiac and Pulmonary Rehab  Date 10/26/20  Educator AS  Instruction Review Code 1- Verbalizes Understanding      Education: Exercise Physiology & General Exercise Guidelines: - Group verbal and written instruction with models to review the exercise physiology of the cardiovascular system and associated critical values. Provides general exercise guidelines with specific guidelines to those with heart or lung disease.    Education: Flexibility, Balance, Mind/Body Relaxation: - Group verbal and visual presentation with interactive activity on the components of exercise prescription. Introduces F.I.T.T principle from ACSM for exercise prescriptions. Reviews F.I.T.T. principles of flexibility and balance exercise training including progression. Also discusses the mind body connection.  Reviews various relaxation techniques to help reduce and manage stress (i.e. Deep breathing, progressive muscle relaxation, and visualization). Balance handout provided to take home. Written material given at graduation.   Activity Barriers & Risk Stratification:  Activity Barriers & Cardiac Risk Stratification - 10/19/20 1114      Activity Barriers & Cardiac Risk Stratification   Activity Barriers None     Cardiac Risk Stratification High           6 Minute Walk:  6 Minute Walk    Row Name 10/26/20 1035 01/15/21 1000       6 Minute Walk   Phase Initial Discharge    Distance 1205 feet 1355 feet    Distance % Change -- 12.4 %    Distance Feet Change -- 150 ft    Walk Time 6 minutes 6 minutes    # of Rest Breaks 0 0    MPH 2.28 2.56    METS 2.6 2.64    RPE 10 15    Perceived Dyspnea  1 --    VO2 Peak 9.2 9.26    Symptoms No Yes (comment)    Comments -- fatigue    Resting HR 77 bpm 80 bpm    Resting BP 152/78 132/78    Resting Oxygen Saturation  94 % --    Exercise Oxygen Saturation  during 6 min walk 93 % --    Max Ex. HR 137 bpm 133 bpm    Max Ex. BP 188/86 166/74  2 Minute Post BP 150/88 --           Oxygen Initial Assessment:   Oxygen Re-Evaluation:   Oxygen Discharge (Final Oxygen Re-Evaluation):   Initial Exercise Prescription:  Initial Exercise Prescription - 10/26/20 1000      Date of Initial Exercise RX and Referring Provider   Date 10/26/20    Referring Provider Fath      Treadmill   MPH 2    Grade 0.5    Minutes 15    METs 2.6      Elliptical   Level 1    Speed 3    Minutes 15    METs 2.6      REL-XR   Level 1    Speed 50    Minutes 15    METs 2.6      T5 Nustep   Level 2    SPM 80    Minutes 15    METs 2.6      Prescription Details   Frequency (times per week) 3    Duration Progress to 30 minutes of continuous aerobic without signs/symptoms of physical distress      Intensity   THRR 40-80% of Max Heartrate 105-135    Ratings of Perceived Exertion 11-13    Perceived Dyspnea 0-4      Resistance Training   Training Prescription Yes    Weight 3 lb    Reps 10-15           Perform Capillary Blood Glucose checks as needed.  Exercise Prescription Changes:  Exercise Prescription Changes    Row Name 10/29/20 1100 11/11/20 1300 11/24/20 1300 12/02/20 1000 12/07/20 1100     Response to Exercise   Blood Pressure (Admit)  128/78 102/62 132/64 -- 142/72   Blood Pressure (Exercise) 152/78 148/70 142/72 -- 136/70   Blood Pressure (Exit) 126/84 122/76 112/66 -- 126/70   Heart Rate (Admit) 92 bpm 90 bpm 95 bpm -- 90 bpm   Heart Rate (Exercise) 128 bpm 130 bpm 129 bpm -- 121 bpm   Heart Rate (Exit) 98 bpm 96 bpm 105 bpm -- 92 bpm   Rating of Perceived Exertion (Exercise) 13 13 11  -- 12   Symptoms tired on TM - did faster than RX -- -- -- --   Comments first day -- -- -- --   Duration -- Continue with 30 min of aerobic exercise without signs/symptoms of physical distress. Continue with 30 min of aerobic exercise without signs/symptoms of physical distress. -- Continue with 30 min of aerobic exercise without signs/symptoms of physical distress.   Intensity -- THRR unchanged THRR unchanged -- THRR unchanged     Progression   Progression -- -- -- -- Continue to progress workloads to maintain intensity without signs/symptoms of physical distress.   Average METs -- -- -- -- 2.7     Resistance Training   Training Prescription Yes Yes Yes -- Yes   Weight 3 lb 3 lb 3 lb -- 6 lb   Reps 10-15 10-15 10-15 -- 10-15     Treadmill   MPH 2.3 2.5 2.5 -- 2.5   Grade 0.5 0 0.5 -- 0.5   Minutes 15 15 15  -- 15   METs 2.76 3.09 3.09 -- 3.09     REL-XR   Level -- 5 5 -- --   Speed -- 50 50 -- --   Minutes -- 15 15 -- --   METs -- 1.7 1.6 -- --  T5 Nustep   Level -- -- -- -- 3   SPM -- -- -- -- 80   Minutes -- -- -- -- 15   METs -- -- -- -- 2.3     Home Exercise Plan   Plans to continue exercise at -- -- -- Home (comment)  start walking and using staff videos Home (comment)  start walking and using staff videos   Frequency -- -- -- Add 2 additional days to program exercise sessions. Add 2 additional days to program exercise sessions.   Initial Home Exercises Provided -- -- -- 12/02/20 12/02/20   Row Name 12/23/20 1500 01/06/21 1600           Response to Exercise   Blood Pressure (Admit) 128/64 132/92      Blood  Pressure (Exercise) 176/74 142/80      Blood Pressure (Exit) 124/72 104/60      Heart Rate (Admit) 95 bpm 81 bpm      Heart Rate (Exercise) 129 bpm 134 bpm      Heart Rate (Exit) 84 bpm 90 bpm      Rating of Perceived Exertion (Exercise) 11 13      Symptoms none none      Duration Continue with 30 min of aerobic exercise without signs/symptoms of physical distress. Continue with 30 min of aerobic exercise without signs/symptoms of physical distress.      Intensity THRR unchanged THRR unchanged             Progression   Progression Continue to progress workloads to maintain intensity without signs/symptoms of physical distress. Continue to progress workloads to maintain intensity without signs/symptoms of physical distress.      Average METs 3.22 2.8             Resistance Training   Training Prescription Yes Yes      Weight 5 lb 6 lb      Reps 10-15 10-15             Interval Training   Interval Training No No             Treadmill   MPH 2.8 2.8      Grade 1 1      Minutes 15 15      METs 3.44 3.44             Recumbant Elliptical   Level 4 4.5      Minutes 15 15      METs 1.9 2.2             REL-XR   Level 3 --      Minutes 15 --      METs 3 --             Home Exercise Plan   Plans to continue exercise at Home (comment)  start walking and using staff videos Home (comment)  start walking and using staff videos      Frequency Add 2 additional days to program exercise sessions. Add 2 additional days to program exercise sessions.      Initial Home Exercises Provided 12/02/20 12/02/20             Exercise Comments:  Exercise Comments    Row Name 10/28/20 7371           Exercise Comments First full day of exercise!  Patient was oriented to gym and equipment including functions, settings, policies, and procedures.  Patient's individual exercise prescription and treatment  plan were reviewed.  All starting workloads were established based on the results of the 6  minute walk test done at initial orientation visit.  The plan for exercise progression was also introduced and progression will be customized based on patient's performance and goals.              Exercise Goals and Review:  Exercise Goals    Row Name 10/26/20 1059             Exercise Goals   Increase Physical Activity Yes       Intervention Provide advice, education, support and counseling about physical activity/exercise needs.;Develop an individualized exercise prescription for aerobic and resistive training based on initial evaluation findings, risk stratification, comorbidities and participant's personal goals.       Expected Outcomes Short Term: Attend rehab on a regular basis to increase amount of physical activity.;Long Term: Add in home exercise to make exercise part of routine and to increase amount of physical activity.;Long Term: Exercising regularly at least 3-5 days a week.       Increase Strength and Stamina Yes       Intervention Provide advice, education, support and counseling about physical activity/exercise needs.;Develop an individualized exercise prescription for aerobic and resistive training based on initial evaluation findings, risk stratification, comorbidities and participant's personal goals.       Expected Outcomes Short Term: Increase workloads from initial exercise prescription for resistance, speed, and METs.;Short Term: Perform resistance training exercises routinely during rehab and add in resistance training at home;Long Term: Improve cardiorespiratory fitness, muscular endurance and strength as measured by increased METs and functional capacity (6MWT)       Able to understand and use rate of perceived exertion (RPE) scale Yes       Intervention Provide education and explanation on how to use RPE scale       Expected Outcomes Short Term: Able to use RPE daily in rehab to express subjective intensity level;Long Term:  Able to use RPE to guide intensity level  when exercising independently       Able to understand and use Dyspnea scale Yes       Intervention Provide education and explanation on how to use Dyspnea scale       Expected Outcomes Short Term: Able to use Dyspnea scale daily in rehab to express subjective sense of shortness of breath during exertion;Long Term: Able to use Dyspnea scale to guide intensity level when exercising independently       Knowledge and understanding of Target Heart Rate Range (THRR) Yes       Intervention Provide education and explanation of THRR including how the numbers were predicted and where they are located for reference       Expected Outcomes Short Term: Able to state/look up THRR;Short Term: Able to use daily as guideline for intensity in rehab;Long Term: Able to use THRR to govern intensity when exercising independently       Able to check pulse independently Yes       Intervention Provide education and demonstration on how to check pulse in carotid and radial arteries.;Review the importance of being able to check your own pulse for safety during independent exercise       Expected Outcomes Short Term: Able to explain why pulse checking is important during independent exercise;Long Term: Able to check pulse independently and accurately       Understanding of Exercise Prescription Yes       Intervention Provide education,  explanation, and written materials on patient's individual exercise prescription       Expected Outcomes Short Term: Able to explain program exercise prescription;Long Term: Able to explain home exercise prescription to exercise independently              Exercise Goals Re-Evaluation :  Exercise Goals Re-Evaluation    Row Name 10/28/20 0958 11/11/20 1344 11/24/20 1306 12/02/20 1016 12/02/20 1029     Exercise Goal Re-Evaluation   Exercise Goals Review Increase Physical Activity;Able to understand and use rate of perceived exertion (RPE) scale;Knowledge and understanding of Target Heart  Rate Range (THRR);Understanding of Exercise Prescription;Increase Strength and Stamina;Able to check pulse independently Increase Physical Activity;Increase Strength and Stamina Increase Physical Activity;Increase Strength and Stamina Increase Physical Activity;Increase Strength and Stamina Increase Physical Activity;Able to understand and use rate of perceived exertion (RPE) scale;Knowledge and understanding of Target Heart Rate Range (THRR);Understanding of Exercise Prescription;Increase Strength and Stamina;Able to understand and use Dyspnea scale;Able to check pulse independently   Comments Reviewed RPE and dyspnea scales, THR and program prescription with pt today.  Pt voiced understanding and was given a copy of goals to take home. Derrick Mckenzie has increased to 2.5 mph on TM.  He has done well on all equipment although elliptical was hard.  Staff will monitor progress. Derrick Mckenzie is tolerating exercise well in his first sessions.  He has added incline to TM . He is up to level 5 on XR.   We will monitor progress. He will do push ups on kitchen counter 25 2x/day. EP to talk about home exercise with Derrick Mckenzie. Reviewed home exercise with pt today.  Pt plans to start walking at home and use staff videos for exercise.  Reviewed THR, pulse, RPE, sign and symptoms, pulse oximetery and when to call 911 or MD.  Also discussed weather considerations and indoor options.  Pt voiced understanding.   Expected Outcomes Short: Use RPE daily to regulate intensity. Long: Follow program prescription in THR. Short: attend consistently Long: improve overall MET level Short:  continue to exercise consistently Long:  improve stamina ST: attend rehab consistently LT: 150 minutes of moderate activity and 2 days of weight training. Short: start walking and using staff videos    Long: continue to exercise at home   [place] Name 12/07/20 1137 12/07/20 1138 12/23/20 1516 01/06/21 0954 01/06/21 1607     Exercise Goal Re-Evaluation   Exercise Goals Review  Increase Physical Activity;Increase Strength and Stamina -- Increase Physical Activity;Increase Strength and Stamina;Understanding of Exercise Prescription Increase Physical Activity;Increase Strength and Stamina;Understanding of Exercise Prescription --   Comments Derrick Mckenzie is progressing well with exercise.  He has increased to 6 lb for strength training. He works in [employer] range.  Staff will monitor progress. Derrick Mckenzie is doing well in rehab.  He is already half way through the program.  He is up to level 4 on the recumbent elliptical.  We will continue to monitor his progress. he reports doing better with his exercise at home he walks and his resistence training most days. He used some exercise videos at the beginning, but has made his own routines. Derrick Mckenzie is progressing well and has increased levels on machines and weights for strength work.  We will monitor progress.   Expected Outcomes -- Short:  continue to exercise consistently Long:  continue to improve stamina Short: Continue to move up workload on treadmill Long: Continue to improe stamina Short: continue to exercise at home Long: Continue to improve stamina Short:  continue  to exercise consistently Long:  complete HT program          Discharge Exercise Prescription (Final Exercise Prescription Changes):  Exercise Prescription Changes - 01/06/21 1600      Response to Exercise   Blood Pressure (Admit) 132/92    Blood Pressure (Exercise) 142/80    Blood Pressure (Exit) 104/60    Heart Rate (Admit) 81 bpm    Heart Rate (Exercise) 134 bpm    Heart Rate (Exit) 90 bpm    Rating of Perceived Exertion (Exercise) 13    Symptoms none    Duration Continue with 30 min of aerobic exercise without signs/symptoms of physical distress.    Intensity THRR unchanged      Progression   Progression Continue to progress workloads to maintain intensity without signs/symptoms of physical distress.    Average METs 2.8      Resistance Training   Training Prescription Yes     Weight 6 lb    Reps 10-15      Interval Training   Interval Training No      Treadmill   MPH 2.8    Grade 1    Minutes 15    METs 3.44      Recumbant Elliptical   Level 4.5    Minutes 15    METs 2.2      Home Exercise Plan   Plans to continue exercise at Home (comment)   start walking and using staff videos   Frequency Add 2 additional days to program exercise sessions.    Initial Home Exercises Provided 12/02/20           Nutrition:  Target Goals: Understanding of nutrition guidelines, daily intake of sodium '1500mg'$ , cholesterol '200mg'$ , calories 30% from fat and 7% or less from saturated fats, daily to have 5 or more servings of fruits and vegetables.  Education: All About Nutrition: -Group instruction provided by verbal, written material, interactive activities, discussions, models, and posters to present general guidelines for heart healthy nutrition including fat, fiber, MyPlate, the role of sodium in heart healthy nutrition, utilization of the nutrition label, and utilization of this knowledge for meal planning. Follow up email sent as well. Written material given at graduation. Flowsheet Row Cardiac Rehab from 01/13/2021 in [facility] Cardiac and Pulmonary Rehab  Date 12/09/20  Educator [facility]  Instruction Review Code 1- Verbalizes Understanding      Biometrics:  Pre Biometrics - 10/26/20 1540      Pre Biometrics   Height 5' 8.25" (1.734 m)    Weight 294 lb 12.8 oz (133.7 kg)    BMI (Calculated) 44.47    Single Leg Stand 6.04 seconds           Post Biometrics - 01/15/21 1001       Post  Biometrics   Height 5' 8.25" (1.734 m)    Weight 290 lb 1.6 oz (131.6 kg)    BMI (Calculated) 43.76    Single Leg Stand 14.6 seconds           Nutrition Therapy Plan and Nutrition Goals:  Nutrition Therapy & Goals - 11/13/20 0955      Nutrition Therapy   Diet Heart healthy, low Na    Drug/Food Interactions Statins/Certain Fruits    Protein (specify units) 100g     Fiber 30 grams    Whole Grain Foods 3 servings    Saturated Fats 12 max. grams    Fruits and Vegetables 8 servings/day    Sodium 1.5 grams  Personal Nutrition Goals   Nutrition Goal ST: try out some of the sodium reducing tips at home, honor hunger (potentially adding heart healthy snack) LT: He would like to lose weight (He would like to be at 200 lbs) and strengthen his legs, eat 8 fruits and vegetables per day, lower sodium to 1.5g per day    Comments A1C: under 6 - metformin. B: grain or protein bar, or eggs L: variety: he only eats whole wheat bread, tuna fish, chicken cold cuts D: salmon, chicken, red meat (2x/week), starch (baked potato usually) or rice, greens (green beans, brocooli, spinach). Discussed heart healthy eating as well as diabetes friendly eating and the hunger scale. He reports using lots of salt with his foods - discussed potassium as well as some techniques to add flavor while slowly reducing sodium including citrus, vinegar, tomato paste, spices, fresh herbs.      Intervention Plan   Intervention Prescribe, educate and counsel regarding individualized specific dietary modifications aiming towards targeted core components such as weight, hypertension, lipid management, diabetes, heart failure and other comorbidities.;Nutrition handout(s) given to patient.    Expected Outcomes Short Term Goal: Understand basic principles of dietary content, such as calories, fat, sodium, cholesterol and nutrients.;Short Term Goal: A plan has been developed with personal nutrition goals set during dietitian appointment.;Long Term Goal: Adherence to prescribed nutrition plan.           Nutrition Assessments:  MEDIFICTS Score Key:  ?70 Need to make dietary changes   40-70 Heart Healthy Diet  ? 40 Therapeutic Level Cholesterol Diet  Flowsheet Row Cardiac Rehab from 10/26/2020 in [facility] Cardiac and Pulmonary Rehab  Picture Your Plate Total Score on Admission 50     Picture Your  Plate Scores:  <99 Unhealthy dietary pattern with much room for improvement.  41-50 Dietary pattern unlikely to meet recommendations for good health and room for improvement.  51-60 More healthful dietary pattern, with some room for improvement.   >60 Healthy dietary pattern, although there may be some specific behaviors that could be improved.    Nutrition Goals Re-Evaluation:  Nutrition Goals Re-Evaluation    Derrick Mckenzie Name 12/02/20 1004 01/06/21 1003           Goals   Nutrition Goal ST: try out some of the sodium reducing tips at home, honor hunger LT: He would like to lose weight (He would like to be at 200 lbs) and strengthen his legs, eat 8 fruits and vegetables per day, lower sodium to 1.5g per day ST: Include non-starchy vegetables to help balance out meals he doesn't cook for himself LT: He would like to lose weight (He would like to be at 200 lbs) and strengthen his legs, eat 8 fruits and vegetables per day, lower sodium to 1.5g per day      Comment He reports he is reducing sodium, but his wife doesn't feel it is enough. He has not really tried any of the tips yet. He has still been trying not to eat after dinner, because he would like to lose weight - talked about importance of honoring hunger and eating enough. Sometimes if he really needs to eat he will have some cereal. He reports reducing his salt, but he doesn't care for it. He is still trying to eat healthy at home, but sometimes hard with his wife at home offering more unhealthy. He does not cook at home as he feels his wife will micromanage him. Discussed how to include more fiber rich food  as he is not cooking at home and this could balance out his meals better, provided some easy recipes for vegetables.      Expected Outcome ST: try out some of the sodium reducing tips at home, honor hunger LT: He would like to lose weight (He would like to be at 200 lbs) and strengthen his legs, eat 8 fruits and vegetables per day, lower sodium  to 1.5g per day ST: Include non-starchy vegetables to help balance out meals he doesn't cook for himself LT: He would like to lose weight (He would like to be at 200 lbs) and strengthen his legs, eat 8 fruits and vegetables per day, lower sodium to 1.5g per day             Nutrition Goals Discharge (Final Nutrition Goals Re-Evaluation):  Nutrition Goals Re-Evaluation - 01/06/21 1003      Goals   Nutrition Goal ST: Include non-starchy vegetables to help balance out meals he doesn't cook for himself LT: He would like to lose weight (He would like to be at 200 lbs) and strengthen his legs, eat 8 fruits and vegetables per day, lower sodium to 1.5g per day    Comment He reports reducing his salt, but he doesn't care for it. He is still trying to eat healthy at home, but sometimes hard with his wife at home offering more unhealthy. He does not cook at home as he feels his wife will micromanage him. Discussed how to include more fiber rich food as he is not cooking at home and this could balance out his meals better, provided some easy recipes for vegetables.    Expected Outcome ST: Include non-starchy vegetables to help balance out meals he doesn't cook for himself LT: He would like to lose weight (He would like to be at 200 lbs) and strengthen his legs, eat 8 fruits and vegetables per day, lower sodium to 1.5g per day           Psychosocial: Target Goals: Acknowledge presence or absence of significant depression and/or stress, maximize coping skills, provide positive support system. Participant is able to verbalize types and ability to use techniques and skills needed for reducing stress and depression.   Education: Stress, Anxiety, and Depression - Group verbal and visual presentation to define topics covered.  Reviews how body is impacted by stress, anxiety, and depression.  Also discusses healthy ways to reduce stress and to treat/manage anxiety and depression.  Written material given at  graduation. Flowsheet Row Cardiac Rehab from 01/13/2021 in [facility] Re[facility]  Date 12/30/20  Educator [facility]  Instruction Review Code 1- Verbalizes Understanding      Education: Sleep Hygiene -Provides group verbal and written instruction about how sleep can affect your health.  Define sleep hygiene, discuss sleep cycles and impact of sleep habits. Review good sleep hygiene tips.    Initial Review & Psychosocial Screening:  Initial Psych Review & Screening - 10/19/20 1115      Initial Review   Current issues with Current Sleep Concerns   Since daughter passed away in 08-04-20 have not been sleeping well since. Has PRN med to help sleep..     Family Dynamics   Concerns Recent loss of child    Comments Daughter passed away in 08-04-2020,      Barriers   Psychosocial barriers to participate in program There are no identifiable barriers or psychosocial needs.;The patient should benefit from training in stress management and relaxation.  Screening Interventions   Interventions Encouraged to exercise;To provide support and resources with identified psychosocial needs;Provide feedback about the scores to participant    Expected Outcomes Short Term goal: Utilizing psychosocial counselor, staff and physician to assist with identification of specific Stressors or current issues interfering with healing process. Setting desired goal for each stressor or current issue identified.;Long Term Goal: Stressors or current issues are controlled or eliminated.;Short Term goal: Identification and review with participant of any Quality of Life or Depression concerns found by scoring the questionnaire.;Long Term goal: The participant improves quality of Life and PHQ9 Scores as seen by post scores and/or verbalization of changes           Quality of Life Scores:   Quality of Life - 10/26/20 1053      Quality of Life   Select Quality of Life      Quality of Life Scores    Health/Function Pre 22.8 %    Socioeconomic Pre 19.63 %    Psych/Spiritual Pre 28.29 %    Family Pre 25.2 %    GLOBAL Pre 23.51 %          Scores of 19 and below usually indicate a poorer quality of life in these areas.  A difference of  2-3 points is a clinically meaningful difference.  A difference of 2-3 points in the total score of the Quality of Life Index has been associated with significant improvement in overall quality of life, self-image, physical symptoms, and general health in studies assessing change in quality of life.  PHQ-9: Recent Review Flowsheet Data    Depression screen [facility] 2/9 12/25/2020 11/16/2020 10/26/2020 03/16/2017 02/13/2017   Decreased Interest 2 1 2  0 0   Down, Depressed, Hopeless 0 2 0 0 0   PHQ - 2 Score 2 3 2  0 0   Altered sleeping 3 2 2   - -   Tired, decreased energy 3 2 3   - -   Change in appetite 2 2 3   - -   Feeling bad or failure about yourself  0 2 0 - -   Trouble concentrating 0 0 2 - -   Moving slowly or fidgety/restless 0 0 0 - -   Suicidal thoughts 0 0 0 - -   PHQ-9 Score 10 11 12  - -   Difficult doing work/chores Not difficult at all Somewhat difficult Somewhat difficult - -     Interpretation of Total Score  Total Score Depression Severity:  1-4 = Minimal depression, 5-9 = Mild depression, 10-14 = Moderate depression, 15-19 = Moderately severe depression, 20-27 = Severe depression   Psychosocial Evaluation and Intervention:  Psychosocial Evaluation - 10/19/20 1136      Psychosocial Evaluation & Interventions   Interventions Encouraged to exercise with the program and follow exercise prescription    Comments Derrick Mckenzie is ready to start the program, he has no barriers to starting. He is ready to get back to his golf game as soon as possible. He has not exercised in some time and is overweight. He does want to lose weight and gain strength to get back to his activities. Derrick Mckenzie lives with his wife in their home. He has a son in [place] and two step  children, one lives in [place] andd the other in [place]. Derrick Mckenzie 's daughter  ,31 years old, passed away in past July 12, 2023. He has not been sleeping well since then. He does have a PRN med to help for sleep.  Derrick Mckenzie stated  that he had gone to visit some family and the night he returned home, he slept well. He will watch and see if this continues. He should do well with the program. He has the desire to get well and get back to his golf game and lose some weight .    Expected Outcomes STG: Derrick Mckenzie attends all scheduled sessions to be able to learn the steps to weight loss and to get stronger to get back to his golf game. LTG: Derrick Mckenzie is able to continue with his progress from program with his weight loss and getting back to his golf game.    Continue Psychosocial Services  Follow up required by staff           Psychosocial Re-Evaluation:  Psychosocial Re-Evaluation    Derrick Mckenzie Name 11/16/20 1005 12/02/20 1008 01/06/21 [id number]         Psychosocial Re-Evaluation   Current issues with Current Depression;History of Depression;Current Sleep Concerns History of Depression;Current Stress Concerns History of Depression;Current Stress Concerns     Comments Reviewed patient health questionnaire (PHQ-9) with patient for follow up. Previously, patients score indicated signs/symptoms of depression.  Reviewed to see if patient is improving symptom wise while in program.  Score improved and patient states that it is because he has been able to be more active. He is getting to meet other people and getting out of the house. He reports having depressive symptoms due to his daughter dying, which is causing him stress and anger becuase they still don't know how it has happened. He reports being happy at home. He reports that he hasn't been sleeping and he has now gotten a medication for sleep, but it still not sleeping. He still reports having low energy since surgery. He will sometimes go golfing to get out of the house, suggested also getting out in  nature. He reports having a good support system in his wife, but he doesn't have anyone else close. he reports doing well and now knows why his daughter passed which was bothering him. He reports continuing to have depressive symptoms due to his daughter dying. He reports being happy at home. He reports that he has been sleeping better. His brother in law just died from lung cancer and his brother in laws wife has breast cancer. He still reports having low energy since surgery, but it is improving - he is frustrated that he is taking his thyroid pills, but they are still not helping. He will sometimes go golfing to get out of the house, suggested also getting out in nature. He reports having a good support system in his wife, but he doesn't have anyone else close.     Expected Outcomes Short: Continue to attend [facility] regularly for regular exercise and social engagement. Long: Continue to improve symptoms and manage a positive mental state. Short: Continue to attend [facility] regularly for regular exercise and social engagement, try going out into nature on a greenwayLong: Continue to improve symptoms and manage a positive mental state. Short: Continue to attend [facility] regularly for regular exercise and social engagement, continue to go golfing Long: Continue to improve symptoms and manage a positive mental state.     Interventions Encouraged to attend Cardiac Rehabilitation for the exercise Encouraged to attend Cardiac Rehabilitation for the exercise Encouraged to attend Cardiac Rehabilitation for the exercise     Continue Psychosocial Services  Follow up required by staff Follow up required by staff Follow up required by staff  Initial Review   Source of Stress Concerns -- Family Family            Psychosocial Discharge (Final Psychosocial Re-Evaluation):  Psychosocial Re-Evaluation - 01/06/21 0958      Psychosocial Re-Evaluation   Current issues with History of  Depression;Current Stress Concerns    Comments he reports doing well and now knows why his daughter passed which was bothering him. He reports continuing to have depressive symptoms due to his daughter dying. He reports being happy at home. He reports that he has been sleeping better. His brother in law just died from lung cancer and his brother in laws wife has breast cancer. He still reports having low energy since surgery, but it is improving - he is frustrated that he is taking his thyroid pills, but they are still not helping. He will sometimes go golfing to get out of the house, suggested also getting out in nature. He reports having a good support system in his wife, but he doesn't have anyone else close.    Expected Outcomes Short: Continue to attend [facility] regularly for regular exercise and social engagement, continue to go golfing Long: Continue to improve symptoms and manage a positive mental state.    Interventions Encouraged to attend Cardiac Rehabilitation for the exercise    Continue Psychosocial Services  Follow up required by staff      Initial Review   Source of Stress Concerns Family           Vocational Rehabilitation: Provide vocational rehab assistance to qualifying candidates.   Vocational Rehab Evaluation & Intervention:  Vocational Rehab - 10/19/20 1125      Initial Vocational Rehab Evaluation & Intervention   Assessment shows need for Vocational Rehabilitation No           Education: Education Goals: Education classes will be provided on a variety of topics geared toward better understanding of heart health and risk factor modification. Participant will state understanding/return demonstration of topics presented as noted by education test scores.  Learning Barriers/Preferences:  Learning Barriers/Preferences - 10/19/20 1123      Learning Barriers/Preferences   Learning Barriers None    Learning Preferences Skilled Demonstration;Verbal Instruction            General Cardiac Education Topics:  AED/CPR: - Group verbal and written instruction with the use of models to demonstrate the basic use of the AED with the basic ABC's of resuscitation.   Anatomy and Cardiac Procedures: - Group verbal and visual presentation and models provide information about basic cardiac anatomy and function. Reviews the testing methods done to diagnose heart disease and the outcomes of the test results. Describes the treatment choices: Medical Management, Angioplasty, or Coronary Bypass Surgery for treating various heart conditions including Myocardial Infarction, Angina, Valve Disease, and Cardiac Arrhythmias.  Written material given at graduation.   Medication Safety: - Group verbal and visual instruction to review commonly prescribed medications for heart and lung disease. Reviews the medication, class of the drug, and side effects. Includes the steps to properly store meds and maintain the prescription regimen.  Written material given at graduation.   Intimacy: - Group verbal instruction through game format to discuss how heart and lung disease can affect sexual intimacy. Written material given at graduation.. Flowsheet Row Cardiac Rehab from 01/13/2021 in [facility] Cardiac and Pulmonary Rehab  Date 01/13/21  Educator jh  Instruction Review Code 1- Verbalizes Understanding      Know Your Numbers and Heart Failure: - Group verbal  and visual instruction to discuss disease risk factors for cardiac and pulmonary disease and treatment options.  Reviews associated critical values for Overweight/Obesity, Hypertension, Cholesterol, and Diabetes.  Discusses basics of heart failure: signs/symptoms and treatments.  Introduces Heart Failure Zone chart for action plan for heart failure.  Written material given at graduation.   Infection Prevention: - Provides verbal and written material to individual with discussion of infection control including proper hand washing and  proper equipment cleaning during exercise session. Flowsheet Row Cardiac Rehab from 01/13/2021 in [facility] Cardiac and Pulmonary Rehab  Date 10/26/20  Educator AS  Instruction Review Code 1- Verbalizes Understanding      Falls Prevention: - Provides verbal and written material to individual with discussion of falls prevention and safety. Flowsheet Row Cardiac Rehab from 01/13/2021 in [facility] Cardiac and Pulmonary Rehab  Date 10/26/20  Educator AS  Instruction Review Code 1- Verbalizes Understanding      Other: -Provides group and verbal instruction on various topics (see comments)   Knowledge Questionnaire Score:   Core Components/Risk Factors/Patient Goals at Admission:  Personal Goals and Risk Factors at Admission - 10/26/20 1046      Core Components/Risk Factors/Patient Goals on Admission    Weight Management Yes;Weight Loss    Intervention Weight Management: Develop a combined nutrition and exercise program designed to reach desired caloric intake, while maintaining appropriate intake of nutrient and fiber, sodium and fats, and appropriate energy expenditure required for the weight goal.;Weight Management: Provide education and appropriate resources to help participant work on and attain dietary goals.;Obesity: Provide education and appropriate resources to help participant work on and attain dietary goals.    Admit Weight 291 lb (132 kg)    Goal Weight: Short Term 288 lb (130.6 kg)    Goal Weight: Long Term 230 lb (104.3 kg)    Expected Outcomes Short Term: Continue to assess and modify interventions until short term weight is achieved;Long Term: Adherence to nutrition and physical activity/exercise program aimed toward attainment of established weight goal;Weight Loss: Understanding of general recommendations for a balanced deficit meal plan, which promotes 1-2 lb weight loss per week and includes a negative energy balance of [phone] kcal/d    Diabetes Yes    Intervention Provide  education about signs/symptoms and action to take for hypo/hyperglycemia.;Provide education about proper nutrition, including hydration, and aerobic/resistive exercise prescription along with prescribed medications to achieve blood glucose in normal ranges: Fasting glucose 65-99 mg/dL    Expected Outcomes Short Term: Participant verbalizes understanding of the signs/symptoms and immediate care of hyper/hypoglycemia, proper foot care and importance of medication, aerobic/resistive exercise and nutrition plan for blood glucose control.;Long Term: Attainment of HbA1C < 7%.    Lipids Yes    Intervention Provide education and support for participant on nutrition & aerobic/resistive exercise along with prescribed medications to achieve LDL '70mg'$ , HDL >$Remo'40mg'OjZCf$ .    Expected Outcomes Short Term: Participant states understanding of desired cholesterol values and is compliant with medications prescribed. Participant is following exercise prescription and nutrition guidelines.;Long Term: Cholesterol controlled with medications as prescribed, with individualized exercise RX and with personalized nutrition plan. Value goals: LDL < $Rem'70mg'UzXv$ , HDL > 40 mg.           Education:Diabetes - Individual verbal and written instruction to review signs/symptoms of diabetes, desired ranges of glucose level fasting, after meals and with exercise. Acknowledge that pre and post exercise glucose checks will be done for 3 sessions at entry of program.   Core Components/Risk Factors/Patient Goals Review:  Goals and Risk Factor Review    Row Name 11/16/20 1000 12/02/20 1018 01/06/21 1007         Core Components/Risk Factors/Patient Goals Review   Personal Goals Review Weight Management/Obesity Weight Management/Obesity Weight Management/Obesity     Review Derrick Mckenzie has stated that he can take his fluid pill as needed. He has been noticing that some days he is heavier and feeling tired. He has been taking his lasix when he thinks his weight  is up due to fluid. He wants to continue with weight loss. Derrick Mckenzie has stated that he can take his fluid pill as needed. He has been noticing that he is still feeling tired. He has been taking his lasix when he thinks his weight is up due to fluid. He wants to continue with weight loss - he reports losing some weight since coming and his was 320lbs at the beginning of his heart problems. Derrick Mckenzie has been taking his lasix every other day which is going well. He is still trying to lose weight, he struggles with meals at home as he doesn't cook and his wife will not cook the most health promoting meals - Rd discussed adding in some non-starchy vegetables to help balance out his meals and add fiber.     Expected Outcomes Short: Lose 5 in a month. Long: get to 200 pounds. Short: continue to exercise at rehab, exercise at home, make dietary changes. Long: get to 200 pounds. Short: continue to exercise at rehab and exercise at home. Include more fiber.  Long: get to 200 pounds.            Core Components/Risk Factors/Patient Goals at Discharge (Final Review):   Goals and Risk Factor Review - 01/06/21 1007      Core Components/Risk Factors/Patient Goals Review   Personal Goals Review Weight Management/Obesity    Review Derrick Mckenzie has been taking his lasix every other day which is going well. He is still trying to lose weight, he struggles with meals at home as he doesn't cook and his wife will not cook the most health promoting meals - Rd discussed adding in some non-starchy vegetables to help balance out his meals and add fiber.    Expected Outcomes Short: continue to exercise at rehab and exercise at home. Include more fiber.  Long: get to 200 pounds.           ITP Comments:  ITP Comments    Row Name 10/19/20 1132 10/28/20 0912 10/28/20 0957 11/25/20 0529 12/23/20 0855   ITP Comments Virtual orientation call completed today. he has an appointment on Date: 10/26/2020 for EP eval and gym Orientation.  Documentation of  diagnosis can be found in [facility] Date: [id number]. 25 Day review completed. Medical Director ITP review done, changes made as directed, and signed approval by Medical Director. First full day of exercise!  Patient was oriented to gym and equipment including functions, settings, policies, and procedures.  Patient's individual exercise prescription and treatment plan were reviewed.  All starting workloads were established based on the results of the 6 minute walk test done at initial orientation visit.  The plan for exercise progression was also introduced and progression will be customized based on patient's performance and goals. 30 Day review completed. Medical Director ITP review done, changes made as directed, and signed approval by Medical Director. 30 Day review completed. Medical Director ITP review done, changes made as directed, and signed approval by Medical Director.   [place] Name 01/20/21 [phone]  ITP Comments 30 Day review completed. Medical Director ITP review done, changes made as directed, and signed approval by Medical Director.              Comments:

## 2021-01-20 NOTE — Progress Notes (Signed)
Daily Session Note  Patient Details  Name: ARTEM BUNTE MRN: [id number] Date of Birth: 12/23/1950 Referring Provider:   Flowsheet Row Cardiac Rehab from 10/26/2020 in [facility] Cardiac and Pulmonary Rehab  Referring Provider Fath      Encounter Date: 01/20/2021  Check In:  Session Check In - 01/20/21 1008      Check-In   Supervising physician immediately available to respond to emergencies See telemetry face sheet for immediately available ER MD    Location [facility]-Cardiac & Pulmonary Rehab    Staff Present Birdie Sons, MPA, Elveria Rising, BA, ACSM CEP, Exercise Physiologist;Joseph Tessie Fass RCP,RRT,BSRT    Virtual Visit No    Medication changes reported     No    Fall or balance concerns reported    No    Warm-up and Cool-down Performed on first and last piece of equipment    Resistance Training Performed Yes    VAD Patient? No    PAD/SET Patient? No      Pain Assessment   Currently in Pain? No/denies              Social History   Tobacco Use  Smoking Status Former Smoker  . Packs/day: 0.50  . Years: 10.00  . Pack years: 5.00  . Types: Cigarettes  . Quit date: 02/03/2016  . Years since quitting: 4.9  Smokeless Tobacco Never Used  Tobacco Comment   Quit in 2017    Goals Met:  Independence with exercise equipment Exercise tolerated well No report of cardiac concerns or symptoms Strength training completed today  Goals Unmet:  Not Applicable  Comments: Pt able to follow exercise prescription today without complaint.  Will continue to monitor for progression.    Dr. Emily Filbert is Medical Director for [facility] and [facility].

## 2021-01-20 NOTE — Patient Instructions (Signed)
Discharge Patient Instructions  Patient Details  Name: Derrick Mckenzie MRN: [id number] Date of Birth: March 24, 1951 Referring Provider:  Teodoro Spray, MD   Number of Visits: 69  Reason for Discharge:  Patient reached a stable level of exercise. Patient independent in their exercise. Patient has met program and personal goals.  Smoking History:  Social History   Tobacco Use  Smoking Status Former Smoker  . Packs/day: 0.50  . Years: 10.00  . Pack years: 5.00  . Types: Cigarettes  . Quit date: 02/03/2016  . Years since quitting: 4.9  Smokeless Tobacco Never Used  Tobacco Comment   Quit in 2017    Diagnosis:  S/P CABG x 2  S/P AVR (aortic valve replacement)  Initial Exercise Prescription:  Initial Exercise Prescription - 10/26/20 1000      Date of Initial Exercise RX and Referring Provider   Date 10/26/20    Referring Provider Fath      Treadmill   MPH 2    Grade 0.5    Minutes 15    METs 2.6      Elliptical   Level 1    Speed 3    Minutes 15    METs 2.6      REL-XR   Level 1    Speed 50    Minutes 15    METs 2.6      T5 Nustep   Level 2    SPM 80    Minutes 15    METs 2.6      Prescription Details   Frequency (times per week) 3    Duration Progress to 30 minutes of continuous aerobic without signs/symptoms of physical distress      Intensity   THRR 40-80% of Max Heartrate 105-135    Ratings of Perceived Exertion 11-13    Perceived Dyspnea 0-4      Resistance Training   Training Prescription Yes    Weight 3 lb    Reps 10-15           Discharge Exercise Prescription (Final Exercise Prescription Changes):  Exercise Prescription Changes - 01/20/21 1100      Response to Exercise   Blood Pressure (Admit) 126/64    Blood Pressure (Exercise) 158/80    Blood Pressure (Exit) 110/60    Heart Rate (Admit) 61 bpm    Heart Rate (Exercise) 139 bpm    Heart Rate (Exit) 107 bpm    Rating of Perceived Exertion (Exercise) 14    Symptoms none     Duration Continue with 30 min of aerobic exercise without signs/symptoms of physical distress.    Intensity THRR unchanged      Progression   Progression Continue to progress workloads to maintain intensity without signs/symptoms of physical distress.    Average METs 3.41      Resistance Training   Training Prescription Yes    Weight 8 lb    Reps 10-15      Interval Training   Interval Training No      Treadmill   MPH 2.8    Grade 1.5    Minutes 15    METs 3.72      Recumbant Elliptical   Level 5    Minutes 15    METs 3      Home Exercise Plan   Plans to continue exercise at Home (comment)   start walking and using staff videos   Frequency Add 2 additional days to program exercise sessions.  Initial Home Exercises Provided 12/02/20           Functional Capacity:  6 Minute Walk    Row Name 10/26/20 1035 01/15/21 1000       6 Minute Walk   Phase Initial Discharge    Distance 1205 feet 1355 feet    Distance % Change -- 12.4 %    Distance Feet Change -- 150 ft    Walk Time 6 minutes 6 minutes    # of Rest Breaks 0 0    MPH 2.28 2.56    METS 2.6 2.64    RPE 10 15    Perceived Dyspnea  1 --    VO2 Peak 9.2 9.26    Symptoms No Yes (comment)    Comments -- fatigue    Resting HR 77 bpm 80 bpm    Resting BP 152/78 132/78    Resting Oxygen Saturation  94 % --    Exercise Oxygen Saturation  during 6 min walk 93 % --    Max Ex. HR 137 bpm 133 bpm    Max Ex. BP 188/86 166/74    2 Minute Post BP 150/88 --           Nutrition & Weight - Outcomes:  Pre Biometrics - 10/26/20 1540      Pre Biometrics   Height 5' 8.25" (1.734 m)    Weight 294 lb 12.8 oz (133.7 kg)    BMI (Calculated) 44.47    Single Leg Stand 6.04 seconds           Post Biometrics - 01/15/21 1001       Post  Biometrics   Height 5' 8.25" (1.734 m)    Weight 290 lb 1.6 oz (131.6 kg)    BMI (Calculated) 43.76    Single Leg Stand 14.6 seconds           Nutrition:  Nutrition  Therapy & Goals - 11/13/20 0955      Nutrition Therapy   Diet Heart healthy, low Na    Drug/Food Interactions Statins/Certain Fruits    Protein (specify units) 100g    Fiber 30 grams    Whole Grain Foods 3 servings    Saturated Fats 12 max. grams    Fruits and Vegetables 8 servings/day    Sodium 1.5 grams      Personal Nutrition Goals   Nutrition Goal ST: try out some of the sodium reducing tips at home, honor hunger (potentially adding heart healthy snack) LT: He would like to lose weight (He would like to be at 200 lbs) and strengthen his legs, eat 8 fruits and vegetables per day, lower sodium to 1.5g per day    Comments A1C: under 6 - metformin. B: grain or protein bar, or eggs L: variety: he only eats whole wheat bread, tuna fish, chicken cold cuts D: salmon, chicken, red meat (2x/week), starch (baked potato usually) or rice, greens (green beans, brocooli, spinach). Discussed heart healthy eating as well as diabetes friendly eating and the hunger scale. He reports using lots of salt with his foods - discussed potassium as well as some techniques to add flavor while slowly reducing sodium including citrus, vinegar, tomato paste, spices, fresh herbs.      Intervention Plan   Intervention Prescribe, educate and counsel regarding individualized specific dietary modifications aiming towards targeted core components such as weight, hypertension, lipid management, diabetes, heart failure and other comorbidities.;Nutrition handout(s) given to patient.    Expected Outcomes Short Term  Goal: Understand basic principles of dietary content, such as calories, fat, sodium, cholesterol and nutrients.;Short Term Goal: A plan has been developed with personal nutrition goals set during dietitian appointment.;Long Term Goal: Adherence to prescribed nutrition plan.          Goals reviewed with patient; copy given to patient.

## 2021-01-29 ENCOUNTER — Encounter: Payer: Medicare HMO | Attending: Cardiology

## 2021-01-29 ENCOUNTER — Other Ambulatory Visit: Payer: Self-pay

## 2021-01-29 DIAGNOSIS — Z951 Presence of aortocoronary bypass graft: Secondary | ICD-10-CM | POA: Diagnosis not present

## 2021-01-29 DIAGNOSIS — Z952 Presence of prosthetic heart valve: Secondary | ICD-10-CM | POA: Insufficient documentation

## 2021-01-29 NOTE — Progress Notes (Signed)
Daily Session Note  Patient Details  Name: Derrick Mckenzie MRN: [id number] Date of Birth: [birth year] Referring Provider:   Flowsheet Row Cardiac Rehab from 10/26/2020 in [facility]  Referring Provider Fath      Encounter Date: 01/29/2021  Check In:  Session Check In - 01/29/21 0947      Check-In   Supervising physician immediately available to respond to emergencies See telemetry face sheet for immediately available ER MD    Location [facility]    Staff Present Birdie Sons, MPA, RN;Joseph Darrin Nipper, [place], RCEP, CCRP, CCET    Virtual Visit No    Medication changes reported     No    Fall or balance concerns reported    No    Warm-up and Cool-down Performed on first and last piece of equipment    Resistance Training Performed Yes    VAD Patient? No    PAD/SET Patient? No      Pain Assessment   Currently in Pain? No/denies              Social History   Tobacco Use  Smoking Status Former Smoker  . Packs/day: 0.50  . Years: 10.00  . Pack years: 5.00  . Types: Cigarettes  . Quit date: 02/03/2016  . Years since quitting: 4.9  Smokeless Tobacco Never Used  Tobacco Comment   Quit in 2017    Goals Met:  Independence with exercise equipment Exercise tolerated well No report of cardiac concerns or symptoms Strength training completed today  Goals Unmet:  Not Applicable  Comments: Pt able to follow exercise prescription today without complaint.  Will continue to monitor for progression.    Dr. Emily Filbert is Medical Director for [place] and [facility].

## 2021-02-01 ENCOUNTER — Other Ambulatory Visit: Payer: Self-pay

## 2021-02-01 ENCOUNTER — Encounter: Payer: Medicare HMO | Admitting: *Deleted

## 2021-02-01 DIAGNOSIS — Z951 Presence of aortocoronary bypass graft: Secondary | ICD-10-CM

## 2021-02-01 DIAGNOSIS — Z952 Presence of prosthetic heart valve: Secondary | ICD-10-CM

## 2021-02-01 NOTE — Progress Notes (Signed)
Cardiac Individual Treatment Plan  Patient Details  Name: Derrick Mckenzie MRN: [id number] Date of Birth: 03-Feb-1951 Referring Provider:   Flowsheet Row Cardiac Rehab from 10/26/2020 in [facility] Cardiac and Pulmonary Rehab  Referring Provider Fath      Initial Encounter Date:  Flowsheet Row Cardiac Rehab from 10/26/2020 in [facility] Cardiac and Pulmonary Rehab  Date 10/26/20      Visit Diagnosis: S/P CABG x 2  S/P AVR (aortic valve replacement)  Patient's Home Medications on Admission:  Current Outpatient Medications:  .  albuterol (PROVENTIL HFA;VENTOLIN HFA) 108 (90 Base) MCG/ACT inhaler, Inhale 2 puffs into the lungs every 6 (six) hours as needed for wheezing or shortness of breath., Disp: 1 Inhaler, Rfl: 2 .  amLODipine (NORVASC) 5 MG tablet, Take 5 mg by mouth daily. (Patient not taking: Reported on 10/19/2020), Disp: , Rfl:  .  aspirin EC 81 MG tablet, Take 81 mg by mouth daily. Swallow whole., Disp: , Rfl:  .  atorvastatin (LIPITOR) 80 MG tablet, , Disp: , Rfl:  .  clopidogrel (PLAVIX) 75 MG tablet, Take 1 tablet (75 mg total) by mouth daily., Disp: 30 tablet, Rfl: 0 .  Fluticasone-Salmeterol (ADVAIR) 100-50 MCG/DOSE AEPB, Inhale 1 puff into the lungs 2 (two) times daily. (Patient not taking: Reported on 05/08/2020), Disp: , Rfl:  .  furosemide (LASIX) 40 MG tablet, Take 40 mg by mouth daily. , Disp: , Rfl:  .  Krill Oil 1000 MG CAPS, Take 2,000 mg by mouth 3 (three) times daily., Disp: , Rfl:  .  levothyroxine (SYNTHROID) 100 MCG tablet, Take 100 mcg by mouth daily., Disp: , Rfl:  .  metFORMIN (GLUCOPHAGE-XR) 500 MG 24 hr tablet, Take 500 mg by mouth in the morning and at bedtime., Disp: , Rfl:  .  Multiple Vitamin (MULTIVITAMIN WITH MINERALS) TABS tablet, Take 1 tablet by mouth daily. (Patient not taking: Reported on 05/08/2020), Disp: , Rfl:  .  omega-3 acid ethyl esters (LOVAZA) 1 g capsule, Take 2 g by mouth daily.  (Patient not taking: Reported on 10/19/2020), Disp: , Rfl:  .   traZODone (DESYREL) 50 MG tablet, Take by mouth., Disp: , Rfl:   Past Medical History: Past Medical History:  Diagnosis Date  . Arthritis    knees  . Complication of anesthesia    aggitation after knee surgery  . Family history of adverse reaction to anesthesia    brother - hallucinations  . GERD (gastroesophageal reflux disease)   . Hx of smoking 06/26/2015  . Hyperlipidemia   . Hypertension   . Hypothyroidism   . Kidney stone   . Morbid obesity ([place])   . Murmur   . Prediabetes 10/14/2016  . Sleep apnea    score of 5 on apnea screen    Tobacco Use: Social History   Tobacco Use  Smoking Status Former Smoker  . Packs/day: 0.50  . Years: 10.00  . Pack years: 5.00  . Types: Cigarettes  . Quit date: 02/03/2016  . Years since quitting: 5.0  Smokeless Tobacco Never Used  Tobacco Comment   Quit in 2017    Labs: Recent Review Flowsheet Data    Labs for ITP Cardiac and Pulmonary Rehab Latest Ref Rng & Units 08/05/2016 09/27/2016 02/13/2017 02/26/2020 02/27/2020   Cholestrol 0 - 200 mg/dL - 182 171 - 137   LDLCALC 0 - 99 mg/dL - 95 88 - 62   HDL >40 mg/dL - 29(L) 29(L) - 28(L)   Trlycerides <150 mg/dL - 291(H) 269(H) -  237(H)   Hemoglobin A1c 4.8 - 5.6 % 5.8(H) - 5.6 6.3(H) 6.2(H)       Exercise Target Goals: Exercise Program Goal: Individual exercise prescription set using results from initial 6 min walk test and THRR while considering  patient's activity barriers and safety.   Exercise Prescription Goal: Initial exercise prescription builds to 30-45 minutes a day of aerobic activity, 2-3 days per week.  Home exercise guidelines will be given to patient during program as part of exercise prescription that the participant will acknowledge.   Education: Aerobic Exercise: - Group verbal and visual presentation on the components of exercise prescription. Introduces F.I.T.T principle from ACSM for exercise prescriptions.  Reviews F.I.T.T. principles of aerobic exercise including  progression. Written material given at graduation. Flowsheet Row Cardiac Rehab from 01/20/2021 in [facility] Cardiac and Pulmonary Rehab  Date 01/13/21  Educator jh  Instruction Review Code 1- Verbalizes Understanding      Education: Resistance Exercise: - Group verbal and visual presentation on the components of exercise prescription. Introduces F.I.T.T principle from ACSM for exercise prescriptions  Reviews F.I.T.T. principles of resistance exercise including progression. Written material given at graduation. Flowsheet Row Cardiac Rehab from 01/20/2021 in [facility] Cardiac and Pulmonary Rehab  Date 01/20/21  Educator [facility]  Instruction Review Code 1- Verbalizes Understanding       Education: Exercise & Equipment Safety: - Individual verbal instruction and demonstration of equipment use and safety with use of the equipment. Flowsheet Row Cardiac Rehab from 01/20/2021 in [facility] Cardiac and Pulmonary Rehab  Date 10/26/20  Educator AS  Instruction Review Code 1- Verbalizes Understanding      Education: Exercise Physiology & General Exercise Guidelines: - Group verbal and written instruction with models to review the exercise physiology of the cardiovascular system and associated critical values. Provides general exercise guidelines with specific guidelines to those with heart or lung disease.    Education: Flexibility, Balance, Mind/Body Relaxation: - Group verbal and visual presentation with interactive activity on the components of exercise prescription. Introduces F.I.T.T principle from ACSM for exercise prescriptions. Reviews F.I.T.T. principles of flexibility and balance exercise training including progression. Also discusses the mind body connection.  Reviews various relaxation techniques to help reduce and manage stress (i.e. Deep breathing, progressive muscle relaxation, and visualization). Balance handout provided to take home. Written material given at graduation.   Activity Barriers & Risk  Stratification:  Activity Barriers & Cardiac Risk Stratification - 10/19/20 1114      Activity Barriers & Cardiac Risk Stratification   Activity Barriers None    Cardiac Risk Stratification High           6 Minute Walk:  6 Minute Walk    Row Name 10/26/20 1035 01/15/21 1000       6 Minute Walk   Phase Initial Discharge    Distance 1205 feet 1355 feet    Distance % Change - 12.4 %    Distance Feet Change - 150 ft    Walk Time 6 minutes 6 minutes    # of Rest Breaks 0 0    MPH 2.28 2.56    METS 2.6 2.64    RPE 10 15    Perceived Dyspnea  1 -    VO2 Peak 9.2 9.26    Symptoms No Yes (comment)    Comments - fatigue    Resting HR 77 bpm 80 bpm    Resting BP 152/78 132/78    Resting Oxygen Saturation  94 % -    Exercise  Oxygen Saturation  during 6 min walk 93 % -    Max Ex. HR 137 bpm 133 bpm    Max Ex. BP 188/86 166/74    2 Minute Post BP 150/88 -           Oxygen Initial Assessment:   Oxygen Re-Evaluation:   Oxygen Discharge (Final Oxygen Re-Evaluation):   Initial Exercise Prescription:  Initial Exercise Prescription - 10/26/20 1000      Date of Initial Exercise RX and Referring Provider   Date 10/26/20    Referring Provider Fath      Treadmill   MPH 2    Grade 0.5    Minutes 15    METs 2.6      Elliptical   Level 1    Speed 3    Minutes 15    METs 2.6      REL-XR   Level 1    Speed 50    Minutes 15    METs 2.6      T5 Nustep   Level 2    SPM 80    Minutes 15    METs 2.6      Prescription Details   Frequency (times per week) 3    Duration Progress to 30 minutes of continuous aerobic without signs/symptoms of physical distress      Intensity   THRR 40-80% of Max Heartrate 105-135    Ratings of Perceived Exertion 11-13    Perceived Dyspnea 0-4      Resistance Training   Training Prescription Yes    Weight 3 lb    Reps 10-15           Perform Capillary Blood Glucose checks as needed.  Exercise Prescription Changes:   Exercise Prescription Changes    Row Name 10/29/20 1100 11/11/20 1300 11/24/20 1300 12/02/20 1000 12/07/20 1100     Response to Exercise   Blood Pressure (Admit) 128/78 102/62 132/64 - 142/72   Blood Pressure (Exercise) 152/78 148/70 142/72 - 136/70   Blood Pressure (Exit) 126/84 122/76 112/66 - 126/70   Heart Rate (Admit) 92 bpm 90 bpm 95 bpm - 90 bpm   Heart Rate (Exercise) 128 bpm 130 bpm 129 bpm - 121 bpm   Heart Rate (Exit) 98 bpm 96 bpm 105 bpm - 92 bpm   Rating of Perceived Exertion (Exercise) '13 13 11 ' - 12   Symptoms tired on TM - did faster than RX - - - -   Comments first day - - - -   Duration - Continue with 30 min of aerobic exercise without signs/symptoms of physical distress. Continue with 30 min of aerobic exercise without signs/symptoms of physical distress. - Continue with 30 min of aerobic exercise without signs/symptoms of physical distress.   Intensity - THRR unchanged THRR unchanged - THRR unchanged     Progression   Progression - - - - Continue to progress workloads to maintain intensity without signs/symptoms of physical distress.   Average METs - - - - 2.7     Resistance Training   Training Prescription Yes Yes Yes - Yes   Weight 3 lb 3 lb 3 lb - 6 lb   Reps 10-15 10-15 10-15 - 10-15     Treadmill   MPH 2.3 2.5 2.5 - 2.5   Grade 0.5 0 0.5 - 0.5   Minutes '15 15 15 ' - 15   METs 2.76 3.09 3.09 - 3.09     REL-XR   Level -  5 5 - -   Speed - 50 50 - -   Minutes - 15 15 - -   METs - 1.7 1.6 - -     T5 Nustep   Level - - - - 3   SPM - - - - 80   Minutes - - - - 15   METs - - - - 2.3     Home Exercise Plan   Plans to continue exercise at - - - Home (comment)  start walking and using staff videos Home (comment)  start walking and using staff videos   Frequency - - - Add 2 additional days to program exercise sessions. Add 2 additional days to program exercise sessions.   Initial Home Exercises Provided - - - 12/02/20 12/02/20   Row Name 12/23/20 1500  01/06/21 1600 01/20/21 1100         Response to Exercise   Blood Pressure (Admit) 128/64 132/92 126/64     Blood Pressure (Exercise) 176/74 142/80 158/80     Blood Pressure (Exit) 124/72 104/60 110/60     Heart Rate (Admit) 95 bpm 81 bpm 61 bpm     Heart Rate (Exercise) 129 bpm 134 bpm 139 bpm     Heart Rate (Exit) 84 bpm 90 bpm 107 bpm     Rating of Perceived Exertion (Exercise) '11 13 14     ' Symptoms none none none     Duration Continue with 30 min of aerobic exercise without signs/symptoms of physical distress. Continue with 30 min of aerobic exercise without signs/symptoms of physical distress. Continue with 30 min of aerobic exercise without signs/symptoms of physical distress.     Intensity THRR unchanged THRR unchanged THRR unchanged           Progression   Progression Continue to progress workloads to maintain intensity without signs/symptoms of physical distress. Continue to progress workloads to maintain intensity without signs/symptoms of physical distress. Continue to progress workloads to maintain intensity without signs/symptoms of physical distress.     Average METs 3.22 2.8 3.41           Resistance Training   Training Prescription Yes Yes Yes     Weight 5 lb 6 lb 8 lb     Reps 10-15 10-15 10-15           Interval Training   Interval Training No No No           Treadmill   MPH 2.8 2.8 2.8     Grade 1 1 1.5     Minutes '15 15 15     ' METs 3.44 3.44 3.72           Recumbant Elliptical   Level 4 4.5 5     Minutes '15 15 15     ' METs 1.9 2.2 3           REL-XR   Level 3 - -     Minutes 15 - -     METs 3 - -           Home Exercise Plan   Plans to continue exercise at Home (comment)  start walking and using staff videos Home (comment)  start walking and using staff videos Home (comment)  start walking and using staff videos     Frequency Add 2 additional days to program exercise sessions. Add 2 additional days to program exercise sessions. Add 2 additional  days to program exercise sessions.  Initial Home Exercises Provided 12/02/20 12/02/20 12/02/20            Exercise Comments:  Exercise Comments    Row Name 10/28/20 7672 02/01/21 0951         Exercise Comments First full day of exercise!  Patient was oriented to gym and equipment including functions, settings, policies, and procedures.  Patient's individual exercise prescription and treatment plan were reviewed.  All starting workloads were established based on the results of the 6 minute walk test done at initial orientation visit.  The plan for exercise progression was also introduced and progression will be customized based on patient's performance and goals. Carold graduated today from  rehab with 36 sessions completed.  Details of the patient's exercise prescription and what He needs to do in order to continue the prescription and progress were discussed with patient.  Patient was given a copy of prescription and goals.  Patient verbalized understanding.  Yon plans to continue to exercise by joining the [employer].             Exercise Goals and Review:  Exercise Goals    Row Name 10/26/20 1059             Exercise Goals   Increase Physical Activity Yes       Intervention Provide advice, education, support and counseling about physical activity/exercise needs.;Develop an individualized exercise prescription for aerobic and resistive training based on initial evaluation findings, risk stratification, comorbidities and participant's personal goals.       Expected Outcomes Short Term: Attend rehab on a regular basis to increase amount of physical activity.;Long Term: Add in home exercise to make exercise part of routine and to increase amount of physical activity.;Long Term: Exercising regularly at least 3-5 days a week.       Increase Strength and Stamina Yes       Intervention Provide advice, education, support and counseling about physical activity/exercise needs.;Develop an  individualized exercise prescription for aerobic and resistive training based on initial evaluation findings, risk stratification, comorbidities and participant's personal goals.       Expected Outcomes Short Term: Increase workloads from initial exercise prescription for resistance, speed, and METs.;Short Term: Perform resistance training exercises routinely during rehab and add in resistance training at home;Long Term: Improve cardiorespiratory fitness, muscular endurance and strength as measured by increased METs and functional capacity (6MWT)       Able to understand and use rate of perceived exertion (RPE) scale Yes       Intervention Provide education and explanation on how to use RPE scale       Expected Outcomes Short Term: Able to use RPE daily in rehab to express subjective intensity level;Long Term:  Able to use RPE to guide intensity level when exercising independently       Able to understand and use Dyspnea scale Yes       Intervention Provide education and explanation on how to use Dyspnea scale       Expected Outcomes Short Term: Able to use Dyspnea scale daily in rehab to express subjective sense of shortness of breath during exertion;Long Term: Able to use Dyspnea scale to guide intensity level when exercising independently       Knowledge and understanding of Target Heart Rate Range (THRR) Yes       Intervention Provide education and explanation of THRR including how the numbers were predicted and where they are located for reference       Expected Outcomes  Short Term: Able to state/look up THRR;Short Term: Able to use daily as guideline for intensity in rehab;Long Term: Able to use THRR to govern intensity when exercising independently       Able to check pulse independently Yes       Intervention Provide education and demonstration on how to check pulse in carotid and radial arteries.;Review the importance of being able to check your own pulse for safety during independent exercise        Expected Outcomes Short Term: Able to explain why pulse checking is important during independent exercise;Long Term: Able to check pulse independently and accurately       Understanding of Exercise Prescription Yes       Intervention Provide education, explanation, and written materials on patient's individual exercise prescription       Expected Outcomes Short Term: Able to explain program exercise prescription;Long Term: Able to explain home exercise prescription to exercise independently              Exercise Goals Re-Evaluation :  Exercise Goals Re-Evaluation    Row Name 10/28/20 0958 11/11/20 1344 11/24/20 1306 12/02/20 1016 12/02/20 1029     Exercise Goal Re-Evaluation   Exercise Goals Review Increase Physical Activity;Able to understand and use rate of perceived exertion (RPE) scale;Knowledge and understanding of Target Heart Rate Range (THRR);Understanding of Exercise Prescription;Increase Strength and Stamina;Able to check pulse independently Increase Physical Activity;Increase Strength and Stamina Increase Physical Activity;Increase Strength and Stamina Increase Physical Activity;Increase Strength and Stamina Increase Physical Activity;Able to understand and use rate of perceived exertion (RPE) scale;Knowledge and understanding of Target Heart Rate Range (THRR);Understanding of Exercise Prescription;Increase Strength and Stamina;Able to understand and use Dyspnea scale;Able to check pulse independently   Comments Reviewed RPE and dyspnea scales, THR and program prescription with pt today.  Pt voiced understanding and was given a copy of goals to take home. Ed has increased to 2.5 mph on TM.  He has done well on all equipment although elliptical was hard.  Staff will monitor progress. Ed is tolerating exercise well in his first sessions.  He has added incline to TM . He is up to level 5 on XR.   We will monitor progress. He will do push ups on kitchen counter 25 2x/day. EP to talk about  home exercise with Ed. Reviewed home exercise with pt today.  Pt plans to start walking at home and use staff videos for exercise.  Reviewed THR, pulse, RPE, sign and symptoms, pulse oximetery and when to call 911 or MD.  Also discussed weather considerations and indoor options.  Pt voiced understanding.   Expected Outcomes Short: Use RPE daily to regulate intensity. Long: Follow program prescription in THR. Short: attend consistently Long: improve overall MET level Short:  continue to exercise consistently Long:  improve stamina ST: attend rehab consistently LT: 150 minutes of moderate activity and 2 days of weight training. Short: start walking and using staff videos    Long: continue to exercise at home   [place] Name 12/07/20 1137 12/07/20 1138 12/23/20 1516 01/06/21 0954 01/06/21 1607     Exercise Goal Re-Evaluation   Exercise Goals Review Increase Physical Activity;Increase Strength and Stamina - Increase Physical Activity;Increase Strength and Stamina;Understanding of Exercise Prescription Increase Physical Activity;Increase Strength and Stamina;Understanding of Exercise Prescription -   Comments Ed is progressing well with exercise.  He has increased to 6 lb for strength training. He works in [employer] range.  Staff will monitor progress. Ed is doing  well in rehab.  He is already half way through the program.  He is up to level 4 on the recumbent elliptical.  We will continue to monitor his progress. he reports doing better with his exercise at home he walks and his resistence training most days. He used some exercise videos at the beginning, but has made his own routines. ed is progressing well and has increased levels on machines and weights for strength work.  We will monitor progress.   Expected Outcomes - Short:  continue to exercise consistently Long:  continue to improve stamina Short: Continue to move up workload on treadmill Long: Continue to improe stamina Short: continue to exercise at home Long:  Continue to improve stamina Short:  continue to exercise consistently Long:  complete HT program   Row Name 01/20/21 1132             Exercise Goal Re-Evaluation   Exercise Goals Review Increase Physical Activity;Increase Strength and Stamina;Understanding of Exercise Prescription       Comments Ed is just about to graduate.  He improved his post 6MWT by 12%!!  He is planning to continue to exercise by walking at home.       Expected Outcomes Continue to exercise independently              Discharge Exercise Prescription (Final Exercise Prescription Changes):  Exercise Prescription Changes - 01/20/21 1100      Response to Exercise   Blood Pressure (Admit) 126/64    Blood Pressure (Exercise) 158/80    Blood Pressure (Exit) 110/60    Heart Rate (Admit) 61 bpm    Heart Rate (Exercise) 139 bpm    Heart Rate (Exit) 107 bpm    Rating of Perceived Exertion (Exercise) 14    Symptoms none    Duration Continue with 30 min of aerobic exercise without signs/symptoms of physical distress.    Intensity THRR unchanged      Progression   Progression Continue to progress workloads to maintain intensity without signs/symptoms of physical distress.    Average METs 3.41      Resistance Training   Training Prescription Yes    Weight 8 lb    Reps 10-15      Interval Training   Interval Training No      Treadmill   MPH 2.8    Grade 1.5    Minutes 15    METs 3.72      Recumbant Elliptical   Level 5    Minutes 15    METs 3      Home Exercise Plan   Plans to continue exercise at Home (comment)   start walking and using staff videos   Frequency Add 2 additional days to program exercise sessions.    Initial Home Exercises Provided 12/02/20           Nutrition:  Target Goals: Understanding of nutrition guidelines, daily intake of sodium <1575m, cholesterol <2014m calories 30% from fat and 7% or less from saturated fats, daily to have 5 or more servings of fruits and  vegetables.  Education: All About Nutrition: -Group instruction provided by verbal, written material, interactive activities, discussions, models, and posters to present general guidelines for heart healthy nutrition including fat, fiber, MyPlate, the role of sodium in heart healthy nutrition, utilization of the nutrition label, and utilization of this knowledge for meal planning. Follow up email sent as well. Written material given at graduation. Flowsheet Row [facility]  Cardiac and Pulmonary Rehab  Date 12/09/20  Educator [facility]  Instruction Review Code 1- Verbalizes Understanding      Biometrics:  Pre Biometrics - 10/26/20 1540      Pre Biometrics   Height 5' 8.25" (1.734 m)    Weight 294 lb 12.8 oz (133.7 kg)    BMI (Calculated) 44.47    Single Leg Stand 6.04 seconds           Post Biometrics - 01/15/21 1001       Post  Biometrics   Height 5' 8.25" (1.734 m)    Weight 290 lb 1.6 oz (131.6 kg)    BMI (Calculated) 43.76    Single Leg Stand 14.6 seconds           Nutrition Therapy Plan and Nutrition Goals:  Nutrition Therapy & Goals - 11/13/20 0955      Nutrition Therapy   Diet Heart healthy, low Na    Drug/Food Interactions Statins/Certain Fruits    Protein (specify units) 100g    Fiber 30 grams    Whole Grain Foods 3 servings    Saturated Fats 12 max. grams    Fruits and Vegetables 8 servings/day    Sodium 1.5 grams      Personal Nutrition Goals   Nutrition Goal ST: try out some of the sodium reducing tips at home, honor hunger (potentially adding heart healthy snack) LT: He would like to lose weight (He would like to be at 200 lbs) and strengthen his legs, eat 8 fruits and vegetables per day, lower sodium to 1.5g per day    Comments A1C: under 6 - metformin. B: grain or protein bar, or eggs L: variety: he only eats whole wheat bread, tuna fish, chicken cold cuts D: salmon, chicken, red meat (2x/week), starch (baked potato usually) or rice,  greens (green beans, brocooli, spinach). Discussed heart healthy eating as well as diabetes friendly eating and the hunger scale. He reports using lots of salt with his foods - discussed potassium as well as some techniques to add flavor while slowly reducing sodium including citrus, vinegar, tomato paste, spices, fresh herbs.      Intervention Plan   Intervention Prescribe, educate and counsel regarding individualized specific dietary modifications aiming towards targeted core components such as weight, hypertension, lipid management, diabetes, heart failure and other comorbidities.;Nutrition handout(s) given to patient.    Expected Outcomes Short Term Goal: Understand basic principles of dietary content, such as calories, fat, sodium, cholesterol and nutrients.;Short Term Goal: A plan has been developed with personal nutrition goals set during dietitian appointment.;Long Term Goal: Adherence to prescribed nutrition plan.           Nutrition Assessments:  MEDIFICTS Score Key:  ?70 Need to make dietary changes   40-70 Heart Healthy Diet  ? 40 Therapeutic Level Cholesterol Diet  Flowsheet Row Cardiac Rehab from 01/20/2021 in [facility] Cardiac and Pulmonary Rehab  Picture Your Plate Total Score on Admission 50  Picture Your Plate Total Score on Discharge 64     Picture Your Plate Scores:  <81 Unhealthy dietary pattern with much room for improvement.  41-50 Dietary pattern unlikely to meet recommendations for good health and room for improvement.  51-60 More healthful dietary pattern, with some room for improvement.   >60 Healthy dietary pattern, although there may be some specific behaviors that could be improved.    Nutrition Goals Re-Evaluation:  Nutrition Goals Re-Evaluation    [place] Name 12/02/20 1004 01/06/21 1003  Goals   Nutrition Goal ST: try out some of the sodium reducing tips at home, honor hunger LT: He would like to lose weight (He would like to be at 200 lbs)  and strengthen his legs, eat 8 fruits and vegetables per day, lower sodium to 1.5g per day ST: Include non-starchy vegetables to help balance out meals he doesn't cook for himself LT: He would like to lose weight (He would like to be at 200 lbs) and strengthen his legs, eat 8 fruits and vegetables per day, lower sodium to 1.5g per day      Comment He reports he is reducing sodium, but his wife doesn't feel it is enough. He has not really tried any of the tips yet. He has still been trying not to eat after dinner, because he would like to lose weight - talked about importance of honoring hunger and eating enough. Sometimes if he really needs to eat he will have some cereal. He reports reducing his salt, but he doesn't care for it. He is still trying to eat healthy at home, but sometimes hard with his wife at home offering more unhealthy. He does not cook at home as he feels his wife will micromanage him. Discussed how to include more fiber rich food as he is not cooking at home and this could balance out his meals better, provided some easy recipes for vegetables.      Expected Outcome ST: try out some of the sodium reducing tips at home, honor hunger LT: He would like to lose weight (He would like to be at 200 lbs) and strengthen his legs, eat 8 fruits and vegetables per day, lower sodium to 1.5g per day ST: Include non-starchy vegetables to help balance out meals he doesn't cook for himself LT: He would like to lose weight (He would like to be at 200 lbs) and strengthen his legs, eat 8 fruits and vegetables per day, lower sodium to 1.5g per day             Nutrition Goals Discharge (Final Nutrition Goals Re-Evaluation):  Nutrition Goals Re-Evaluation - 01/06/21 1003      Goals   Nutrition Goal ST: Include non-starchy vegetables to help balance out meals he doesn't cook for himself LT: He would like to lose weight (He would like to be at 200 lbs) and strengthen his legs, eat 8 fruits and vegetables per  day, lower sodium to 1.5g per day    Comment He reports reducing his salt, but he doesn't care for it. He is still trying to eat healthy at home, but sometimes hard with his wife at home offering more unhealthy. He does not cook at home as he feels his wife will micromanage him. Discussed how to include more fiber rich food as he is not cooking at home and this could balance out his meals better, provided some easy recipes for vegetables.    Expected Outcome ST: Include non-starchy vegetables to help balance out meals he doesn't cook for himself LT: He would like to lose weight (He would like to be at 200 lbs) and strengthen his legs, eat 8 fruits and vegetables per day, lower sodium to 1.5g per day           Psychosocial: Target Goals: Acknowledge presence or absence of significant depression and/or stress, maximize coping skills, provide positive support system. Participant is able to verbalize types and ability to use techniques and skills needed for reducing stress and depression.  Education: Stress, Anxiety, and Depression - Group verbal and visual presentation to define topics covered.  Reviews how body is impacted by stress, anxiety, and depression.  Also discusses healthy ways to reduce stress and to treat/manage anxiety and depression.  Written material given at graduation. Flowsheet Row Cardiac Rehab from 01/20/2021 in [facility] Cardiac and Pulmonary Rehab  Date 12/30/20  Educator [facility]  Instruction Review Code 1- Verbalizes Understanding      Education: Sleep Hygiene -Provides group verbal and written instruction about how sleep can affect your health.  Define sleep hygiene, discuss sleep cycles and impact of sleep habits. Review good sleep hygiene tips.    Initial Review & Psychosocial Screening:  Initial Psych Review & Screening - 10/19/20 1115      Initial Review   Current issues with Current Sleep Concerns   Since daughter passed away in 2020-07-04 have not been sleeping well  since. Has PRN med to help sleep..     Family Dynamics   Concerns Recent loss of child    Comments Daughter passed away in 07-04-20,      Barriers   Psychosocial barriers to participate in program There are no identifiable barriers or psychosocial needs.;The patient should benefit from training in stress management and relaxation.      Screening Interventions   Interventions Encouraged to exercise;To provide support and resources with identified psychosocial needs;Provide feedback about the scores to participant    Expected Outcomes Short Term goal: Utilizing psychosocial counselor, staff and physician to assist with identification of specific Stressors or current issues interfering with healing process. Setting desired goal for each stressor or current issue identified.;Long Term Goal: Stressors or current issues are controlled or eliminated.;Short Term goal: Identification and review with participant of any Quality of Life or Depression concerns found by scoring the questionnaire.;Long Term goal: The participant improves quality of Life and PHQ9 Scores as seen by post scores and/or verbalization of changes           Quality of Life Scores:   Quality of Life - 01/20/21 1028      Quality of Life Scores   Health/Function Pre 22.8 %    Health/Function Post 24.33 %    Health/Function % Change 6.71 %    Socioeconomic Pre 19.63 %    Socioeconomic Post 27.94 %    Socioeconomic % Change  42.33 %    Psych/Spiritual Pre 28.29 %    Psych/Spiritual Post 30 %    Psych/Spiritual % Change 6.04 %    Family Pre 25.2 %    Family Post 30 %    Family % Change 19.05 %    GLOBAL Pre 23.51 %    GLOBAL Post 27.1 %    GLOBAL % Change 15.27 %          Scores of 19 and below usually indicate a poorer quality of life in these areas.  A difference of  2-3 points is a clinically meaningful difference.  A difference of 2-3 points in the total score of the Quality of Life Index has been associated with  significant improvement in overall quality of life, self-image, physical symptoms, and general health in studies assessing change in quality of life.  PHQ-9: Recent Review Flowsheet Data    Depression screen [facility] 2/9 01/20/2021 12/25/2020 11/16/2020 10/26/2020 03/16/2017   Decreased Interest '1 2 1 2 ' 0   Down, Depressed, Hopeless 0 0 2 0 0   PHQ - 2 Score '1 2 3 2 ' 0  Altered sleeping '1 3 2 2  ' -   Tired, decreased energy '2 3 2 3  ' -   Change in appetite 0 '2 2 3  ' -   Feeling bad or failure about yourself  0 0 2 0 -   Trouble concentrating 1 0 0 2 -   Moving slowly or fidgety/restless 0 0 0 0 -   Suicidal thoughts 0 0 0 0 -   PHQ-9 Score '5 10 11 12 ' -   Difficult doing work/chores Not difficult at all Not difficult at all Somewhat difficult Somewhat difficult -     Interpretation of Total Score  Total Score Depression Severity:  1-4 = Minimal depression, 5-9 = Mild depression, 10-14 = Moderate depression, 15-19 = Moderately severe depression, 20-27 = Severe depression   Psychosocial Evaluation and Intervention:  Psychosocial Evaluation - 10/19/20 1136      Psychosocial Evaluation & Interventions   Interventions Encouraged to exercise with the program and follow exercise prescription    Comments Ed is ready to start the program, he has no barriers to starting. He is ready to get back to his golf game as soon as possible. He has not exercised in some time and is overweight. He does want to lose weight and gain strength to get back to his activities. Ed lives with his wife in their home. He has a son in [place] and two step children, one lives in [place] andd the other in [place]. Ed 's daughter  ,75 years old, passed away in past 2023/07/11. He has not been sleeping well since then. He does have a PRN med to help for sleep.  Ed stated that he had gone to visit some family and the night he returned home, he slept well. He will watch and see if this continues. He should do well with the program. He has the desire to  get well and get back to his golf game and lose some weight .    Expected Outcomes STG: Ed attends all scheduled sessions to be able to learn the steps to weight loss and to get stronger to get back to his golf game. LTG: Ed is able to continue with his progress from program with his weight loss and getting back to his golf game.    Continue Psychosocial Services  Follow up required by staff           Psychosocial Re-Evaluation:  Psychosocial Re-Evaluation    [place] Name 11/16/20 1005 12/02/20 1008 01/06/21 3662         Psychosocial Re-Evaluation   Current issues with Current Depression;History of Depression;Current Sleep Concerns History of Depression;Current Stress Concerns History of Depression;Current Stress Concerns     Comments Reviewed patient health questionnaire (PHQ-9) with patient for follow up. Previously, patients score indicated signs/symptoms of depression.  Reviewed to see if patient is improving symptom wise while in program.  Score improved and patient states that it is because he has been able to be more active. He is getting to meet other people and getting out of the house. He reports having depressive symptoms due to his daughter dying, which is causing him stress and anger becuase they still don't know how it has happened. He reports being happy at home. He reports that he hasn't been sleeping and he has now gotten a medication for sleep, but it still not sleeping. He still reports having low energy since surgery. He will sometimes go golfing to get out of the house, suggested also  getting out in nature. He reports having a good support system in his wife, but he doesn't have anyone else close. he reports doing well and now knows why his daughter passed which was bothering him. He reports continuing to have depressive symptoms due to his daughter dying. He reports being happy at home. He reports that he has been sleeping better. His brother in law just died from lung cancer and  his brother in laws wife has breast cancer. He still reports having low energy since surgery, but it is improving - he is frustrated that he is taking his thyroid pills, but they are still not helping. He will sometimes go golfing to get out of the house, suggested also getting out in nature. He reports having a good support system in his wife, but he doesn't have anyone else close.     Expected Outcomes Short: Continue to attend [facility] regularly for regular exercise and social engagement. Long: Continue to improve symptoms and manage a positive mental state. Short: Continue to attend [facility] regularly for regular exercise and social engagement, try going out into nature on a greenwayLong: Continue to improve symptoms and manage a positive mental state. Short: Continue to attend [facility] regularly for regular exercise and social engagement, continue to go golfing Long: Continue to improve symptoms and manage a positive mental state.     Interventions Encouraged to attend Cardiac Rehabilitation for the exercise Encouraged to attend Cardiac Rehabilitation for the exercise Encouraged to attend Cardiac Rehabilitation for the exercise     Continue Psychosocial Services  Follow up required by staff Follow up required by staff Follow up required by staff           Initial Review   Source of Stress Concerns - Family Family            Psychosocial Discharge (Final Psychosocial Re-Evaluation):  Psychosocial Re-Evaluation - 01/06/21 0958      Psychosocial Re-Evaluation   Current issues with History of Depression;Current Stress Concerns    Comments he reports doing well and now knows why his daughter passed which was bothering him. He reports continuing to have depressive symptoms due to his daughter dying. He reports being happy at home. He reports that he has been sleeping better. His brother in law just died from lung cancer and his brother in laws wife has breast cancer. He still reports having  low energy since surgery, but it is improving - he is frustrated that he is taking his thyroid pills, but they are still not helping. He will sometimes go golfing to get out of the house, suggested also getting out in nature. He reports having a good support system in his wife, but he doesn't have anyone else close.    Expected Outcomes Short: Continue to attend [facility] regularly for regular exercise and social engagement, continue to go golfing Long: Continue to improve symptoms and manage a positive mental state.    Interventions Encouraged to attend Cardiac Rehabilitation for the exercise    Continue Psychosocial Services  Follow up required by staff      Initial Review   Source of Stress Concerns Family           Vocational Rehabilitation: Provide vocational rehab assistance to qualifying candidates.   Vocational Rehab Evaluation & Intervention:  Vocational Rehab - 10/19/20 1125      Initial Vocational Rehab Evaluation & Intervention   Assessment shows need for Vocational Rehabilitation No  Education: Education Goals: Education classes will be provided on a variety of topics geared toward better understanding of heart health and risk factor modification. Participant will state understanding/return demonstration of topics presented as noted by education test scores.  Learning Barriers/Preferences:  Learning Barriers/Preferences - 10/19/20 1123      Learning Barriers/Preferences   Learning Barriers None    Learning Preferences Skilled Demonstration;Verbal Instruction           General Cardiac Education Topics:  AED/CPR: - Group verbal and written instruction with the use of models to demonstrate the basic use of the AED with the basic ABC's of resuscitation.   Anatomy and Cardiac Procedures: - Group verbal and visual presentation and models provide information about basic cardiac anatomy and function. Reviews the testing methods done to diagnose heart  disease and the outcomes of the test results. Describes the treatment choices: Medical Management, Angioplasty, or Coronary Bypass Surgery for treating various heart conditions including Myocardial Infarction, Angina, Valve Disease, and Cardiac Arrhythmias.  Written material given at graduation. Flowsheet Row Cardiac Rehab from 01/20/2021 in [facility] Cardiac and Pulmonary Rehab  Date 01/20/21  Educator SB  Instruction Review Code 1- Verbalizes Understanding      Medication Safety: - Group verbal and visual instruction to review commonly prescribed medications for heart and lung disease. Reviews the medication, class of the drug, and side effects. Includes the steps to properly store meds and maintain the prescription regimen.  Written material given at graduation.   Intimacy: - Group verbal instruction through game format to discuss how heart and lung disease can affect sexual intimacy. Written material given at graduation.. Flowsheet Row Cardiac Rehab from 01/20/2021 in [facility] Cardiac and Pulmonary Rehab  Date 01/13/21  Educator jh  Instruction Review Code 1- Verbalizes Understanding      Know Your Numbers and Heart Failure: - Group verbal and visual instruction to discuss disease risk factors for cardiac and pulmonary disease and treatment options.  Reviews associated critical values for Overweight/Obesity, Hypertension, Cholesterol, and Diabetes.  Discusses basics of heart failure: signs/symptoms and treatments.  Introduces Heart Failure Zone chart for action plan for heart failure.  Written material given at graduation.   Infection Prevention: - Provides verbal and written material to individual with discussion of infection control including proper hand washing and proper equipment cleaning during exercise session. Flowsheet Row Cardiac Rehab from 01/20/2021 in [facility] Cardiac and Pulmonary Rehab  Date 10/26/20  Educator AS  Instruction Review Code 1- Verbalizes Understanding      Falls  Prevention: - Provides verbal and written material to individual with discussion of falls prevention and safety. Flowsheet Row Cardiac Rehab from 01/20/2021 in [facility] Cardiac and Pulmonary Rehab  Date 10/26/20  Educator AS  Instruction Review Code 1- Verbalizes Understanding      Other: -Provides group and verbal instruction on various topics (see comments)   Knowledge Questionnaire Score:  Knowledge Questionnaire Score - 01/20/21 1028      Knowledge Questionnaire Score   Post Score 26/26           Core Components/Risk Factors/Patient Goals at Admission:  Personal Goals and Risk Factors at Admission - 10/26/20 1046      Core Components/Risk Factors/Patient Goals on Admission    Weight Management Yes;Weight Loss    Intervention Weight Management: Develop a combined nutrition and exercise program designed to reach desired caloric intake, while maintaining appropriate intake of nutrient and fiber, sodium and fats, and appropriate energy expenditure required for the weight goal.;Weight Management:  Provide education and appropriate resources to help participant work on and attain dietary goals.;Obesity: Provide education and appropriate resources to help participant work on and attain dietary goals.    Admit Weight 291 lb (132 kg)    Goal Weight: Short Term 288 lb (130.6 kg)    Goal Weight: Long Term 230 lb (104.3 kg)    Expected Outcomes Short Term: Continue to assess and modify interventions until short term weight is achieved;Long Term: Adherence to nutrition and physical activity/exercise program aimed toward attainment of established weight goal;Weight Loss: Understanding of general recommendations for a balanced deficit meal plan, which promotes 1-2 lb weight loss per week and includes a negative energy balance of [phone] kcal/d    Diabetes Yes    Intervention Provide education about signs/symptoms and action to take for hypo/hyperglycemia.;Provide education about proper nutrition,  including hydration, and aerobic/resistive exercise prescription along with prescribed medications to achieve blood glucose in normal ranges: Fasting glucose 65-99 mg/dL    Expected Outcomes Short Term: Participant verbalizes understanding of the signs/symptoms and immediate care of hyper/hypoglycemia, proper foot care and importance of medication, aerobic/resistive exercise and nutrition plan for blood glucose control.;Long Term: Attainment of HbA1C < 7%.    Lipids Yes    Intervention Provide education and support for participant on nutrition & aerobic/resistive exercise along with prescribed medications to achieve LDL <44m, HDL >482m    Expected Outcomes Short Term: Participant states understanding of desired cholesterol values and is compliant with medications prescribed. Participant is following exercise prescription and nutrition guidelines.;Long Term: Cholesterol controlled with medications as prescribed, with individualized exercise RX and with personalized nutrition plan. Value goals: LDL < 708mHDL > 40 mg.           Education:Diabetes - Individual verbal and written instruction to review signs/symptoms of diabetes, desired ranges of glucose level fasting, after meals and with exercise. Acknowledge that pre and post exercise glucose checks will be done for 3 sessions at entry of program.   Core Components/Risk Factors/Patient Goals Review:   Goals and Risk Factor Review    Row Name 11/16/20 1000 12/02/20 1018 01/06/21 1007         Core Components/Risk Factors/Patient Goals Review   Personal Goals Review Weight Management/Obesity Weight Management/Obesity Weight Management/Obesity     Review Ed has stated that he can take his fluid pill as needed. He has been noticing that some days he is heavier and feeling tired. He has been taking his lasix when he thinks his weight is up due to fluid. He wants to continue with weight loss. Ed has stated that he can take his fluid pill as needed.  He has been noticing that he is still feeling tired. He has been taking his lasix when he thinks his weight is up due to fluid. He wants to continue with weight loss - he reports losing some weight since coming and his was 320lbs at the beginning of his heart problems. Ed has been taking his lasix every other day which is going well. He is still trying to lose weight, he struggles with meals at home as he doesn't cook and his wife will not cook the most health promoting meals - Rd discussed adding in some non-starchy vegetables to help balance out his meals and add fiber.     Expected Outcomes Short: Lose 5 in a month. Long: get to 200 pounds. Short: continue to exercise at rehab, exercise at home, make dietary changes. Long: get to 200 pounds. Short:  continue to exercise at rehab and exercise at home. Include more fiber.  Long: get to 200 pounds.            Core Components/Risk Factors/Patient Goals at Discharge (Final Review):   Goals and Risk Factor Review - 01/06/21 1007      Core Components/Risk Factors/Patient Goals Review   Personal Goals Review Weight Management/Obesity    Review Ed has been taking his lasix every other day which is going well. He is still trying to lose weight, he struggles with meals at home as he doesn't cook and his wife will not cook the most health promoting meals - Rd discussed adding in some non-starchy vegetables to help balance out his meals and add fiber.    Expected Outcomes Short: continue to exercise at rehab and exercise at home. Include more fiber.  Long: get to 200 pounds.           ITP Comments:  ITP Comments    Row Name 10/19/20 1132 10/28/20 0912 10/28/20 0957 11/25/20 0529 12/23/20 0855   ITP Comments Virtual orientation call completed today. he has an appointment on Date: 10/26/2020 for EP eval and gym Orientation.  Documentation of diagnosis can be found in [facility] Date: [id number]. 4 Day review completed. Medical Director ITP review done, changes made  as directed, and signed approval by Medical Director. First full day of exercise!  Patient was oriented to gym and equipment including functions, settings, policies, and procedures.  Patient's individual exercise prescription and treatment plan were reviewed.  All starting workloads were established based on the results of the 6 minute walk test done at initial orientation visit.  The plan for exercise progression was also introduced and progression will be customized based on patient's performance and goals. 30 Day review completed. Medical Director ITP review done, changes made as directed, and signed approval by Medical Director. 30 Day review completed. Medical Director ITP review done, changes made as directed, and signed approval by Medical Director.   [place] Name 01/20/21 0618 02/01/21 0951         ITP Comments 30 Day review completed. Medical Director ITP review done, changes made as directed, and signed approval by Medical Director. Geoff graduated today from  rehab with 36 sessions completed.  Details of the patient's exercise prescription and what He needs to do in order to continue the prescription and progress were discussed with patient.  Patient was given a copy of prescription and goals.  Patient verbalized understanding.  Larance plans to continue to exercise by joining the [employer].             Comments: Discharge ITP

## 2021-02-01 NOTE — Progress Notes (Signed)
Daily Session Note  Patient Details  Name: Derrick Mckenzie MRN: [id number] Date of Birth: 1951-04-10 Referring Provider:   Flowsheet Row Cardiac Rehab from 10/26/2020 in [facility] Cardiac [facility]  Referring Provider Fath      Encounter Date: 02/01/2021  Check In:  Session Check In - 02/01/21 0950      Check-In   Supervising physician immediately available to respond to emergencies See telemetry face sheet for immediately available ER MD    Location [facility]-Cardiac & Pulmonary Rehab    Staff Present Heath Lark, RN, BSN, CCRP;Joseph Tedd Sias, [place], ACSM CEP, Exercise Physiologist;Meredith Sherryll Burger, RN BSN    Virtual Visit No    Medication changes reported     No    Fall or balance concerns reported    No    Warm-up and Cool-down Performed on first and last piece of equipment    Resistance Training Performed Yes    VAD Patient? No    PAD/SET Patient? No      Pain Assessment   Currently in Pain? No/denies              Social History   Tobacco Use  Smoking Status Former Smoker  . Packs/day: 0.50  . Years: 10.00  . Pack years: 5.00  . Types: Cigarettes  . Quit date: 02/03/2016  . Years since quitting: 5.0  Smokeless Tobacco Never Used  Tobacco Comment   Quit in 2017    Goals Met:  Independence with exercise equipment Exercise tolerated well Personal goals reviewed No report of cardiac concerns or symptoms  Goals Unmet:  Not Applicable  Comments:  Hunter graduated today from  rehab with 36 sessions completed.  Details of the patient's exercise prescription and what He needs to do in order to continue the prescription and progress were discussed with patient.  Patient was given a copy of prescription and goals.  Patient verbalized understanding.  Dennis plans to continue to exercise by joining the [employer].    Dr. Emily Filbert is Medical Director for [facility].

## 2021-02-01 NOTE — Progress Notes (Signed)
Discharge Progress Report  Patient Details  Name: Derrick Mckenzie MRN: [id number] Date of Birth: 23-Oct-1951 Referring Provider:   Flowsheet Row Cardiac Rehab from 10/26/2020 in [facility] Cardiac [facility] Pul[facility] Rehab  Referring Provider Fath       Number of Visits: 68  Reason for Discharge:  Patient reached a stable level of exercise. Patient independent in their exercise.  Diagnosis:  S/P CABG x 2  S/P AVR (aortic valve replacement)  Initial Exercise Prescription:  Initial Exercise Prescription - 10/26/20 1000      Date of Initial Exercise RX and Referring Provider   Date 10/26/20    Referring Provider Fath      Treadmill   MPH 2    Grade 0.5    Minutes 15    METs 2.6      Elliptical   Level 1    Speed 3    Minutes 15    METs 2.6      REL-XR   Level 1    Speed 50    Minutes 15    METs 2.6      T5 Nustep   Level 2    SPM 80    Minutes 15    METs 2.6      Prescription Details   Frequency (times per week) 3    Duration Progress to 30 minutes of continuous aerobic without signs/symptoms of physical distress      Intensity   THRR 40-80% of Max Heartrate 105-135    Ratings of Perceived Exertion 11-13    Perceived Dyspnea 0-4      Resistance Training   Training Prescription Yes    Weight 3 lb    Reps 10-15           Discharge Exercise Prescription (Final Exercise Prescription Changes):  Exercise Prescription Changes - 01/20/21 1100      Response to Exercise   Blood Pressure (Admit) 126/64    Blood Pressure (Exercise) 158/80    Blood Pressure (Exit) 110/60    Heart Rate (Admit) 61 bpm    Heart Rate (Exercise) 139 bpm    Heart Rate (Exit) 107 bpm    Rating of Perceived Exertion (Exercise) 14    Symptoms none    Duration Continue with 30 min of aerobic exercise without signs/symptoms of physical distress.    Intensity THRR unchanged      Progression   Progression Continue to progress workloads to maintain intensity without signs/symptoms of  physical distress.    Average METs 3.41      Resistance Training   Training Prescription Yes    Weight 8 lb    Reps 10-15      Interval Training   Interval Training No      Treadmill   MPH 2.8    Grade 1.5    Minutes 15    METs 3.72      Recumbant Elliptical   Level 5    Minutes 15    METs 3      Home Exercise Plan   Plans to continue exercise at Home (comment)   start walking and using staff videos   Frequency Add 2 additional days to program exercise sessions.    Initial Home Exercises Provided 12/02/20           Functional Capacity:  6 Minute Walk    Row Name 10/26/20 1035 01/15/21 1000       6 Minute Walk   Phase Initial Discharge  Distance 1205 feet 1355 feet    Distance % Change -- 12.4 %    Distance Feet Change -- 150 ft    Walk Time 6 minutes 6 minutes    # of Rest Breaks 0 0    MPH 2.28 2.56    METS 2.6 2.64    RPE 10 15    Perceived Dyspnea  1 --    VO2 Peak 9.2 9.26    Symptoms No Yes (comment)    Comments -- fatigue    Resting HR 77 bpm 80 bpm    Resting BP 152/78 132/78    Resting Oxygen Saturation  94 % --    Exercise Oxygen Saturation  during 6 min walk 93 % --    Max Ex. HR 137 bpm 133 bpm    Max Ex. BP 188/86 166/74    2 Minute Post BP 150/88 --           Psychological, QOL, Others - Outcomes: PHQ 2/9: Depression screen [facility] 2/9 01/20/2021 12/25/2020 11/16/2020 10/26/2020 03/16/2017  Decreased Interest 1 2 1 2  0  Down, Depressed, Hopeless 0 0 2 0 0  PHQ - 2 Score 1 2 3 2  0  Altered sleeping 1 3 2 2  -  Tired, decreased energy 2 3 2 3  -  Change in appetite 0 2 2 3  -  Feeling bad or failure about yourself  0 0 2 0 -  Trouble concentrating 1 0 0 2 -  Moving slowly or fidgety/restless 0 0 0 0 -  Suicidal thoughts 0 0 0 0 -  PHQ-9 Score 5 10 11 12  -  Difficult doing work/chores Not difficult at all Not difficult at all Somewhat difficult Somewhat difficult -    Nutrition & Weight - Outcomes:  Pre Biometrics - 10/26/20 1540       Pre Biometrics   Height 5' 8.25" (1.734 m)    Weight 294 lb 12.8 oz (133.7 kg)    BMI (Calculated) 44.47    Single Leg Stand 6.04 seconds           Post Biometrics - 01/15/21 1001       Post  Biometrics   Height 5' 8.25" (1.734 m)    Weight 290 lb 1.6 oz (131.6 kg)    BMI (Calculated) 43.76    Single Leg Stand 14.6 seconds           Nutrition:  Nutrition Therapy & Goals - 11/13/20 0955      Nutrition Therapy   Diet Heart healthy, low Na    Drug/Food Interactions Statins/Certain Fruits    Protein (specify units) 100g    Fiber 30 grams    Whole Grain Foods 3 servings    Saturated Fats 12 max. grams    Fruits and Vegetables 8 servings/day    Sodium 1.5 grams      Personal Nutrition Goals   Nutrition Goal ST: try out some of the sodium reducing tips at home, honor hunger (potentially adding heart healthy snack) LT: He would like to lose weight (He would like to be at 200 lbs) and strengthen his legs, eat 8 fruits and vegetables per day, lower sodium to 1.5g per day    Comments A1C: under 6 - metformin. B: grain or protein bar, or eggs L: variety: he only eats whole wheat bread, tuna fish, chicken cold cuts D: salmon, chicken, red meat (2x/week), starch (baked potato usually) or rice, greens (green beans, brocooli, spinach). Discussed heart healthy eating  as well as diabetes friendly eating and the hunger scale. He reports using lots of salt with his foods - discussed potassium as well as some techniques to add flavor while slowly reducing sodium including citrus, vinegar, tomato paste, spices, fresh herbs.      Intervention Plan   Intervention Prescribe, educate and counsel regarding individualized specific dietary modifications aiming towards targeted core components such as weight, hypertension, lipid management, diabetes, heart failure and other comorbidities.;Nutrition handout(s) given to patient.    Expected Outcomes Short Term Goal: Understand basic principles of dietary  content, such as calories, fat, sodium, cholesterol and nutrients.;Short Term Goal: A plan has been developed with personal nutrition goals set during dietitian appointment.;Long Term Goal: Adherence to prescribed nutrition plan.           Goals reviewed with patient; copy given to patient.

## 2021-02-16 ENCOUNTER — Other Ambulatory Visit: Payer: Self-pay

## 2021-02-16 ENCOUNTER — Ambulatory Visit: Payer: Medicare HMO | Admitting: Gastroenterology

## 2021-02-16 VITALS — BP 116/75 | HR 70 | Ht 68.0 in | Wt 292.0 lb

## 2021-02-16 DIAGNOSIS — Z791 Long term (current) use of non-steroidal anti-inflammatories (NSAID): Secondary | ICD-10-CM | POA: Diagnosis not present

## 2021-02-16 DIAGNOSIS — R109 Unspecified abdominal pain: Secondary | ICD-10-CM

## 2021-02-16 DIAGNOSIS — K921 Melena: Secondary | ICD-10-CM | POA: Diagnosis not present

## 2021-02-16 MED ORDER — OMEPRAZOLE 40 MG PO CPDR: 40.0000 mg | DELAYED_RELEASE_CAPSULE | Freq: Two times a day (BID) | ORAL | 1 refills | Status: DC

## 2021-02-16 NOTE — Progress Notes (Signed)
Derrick Bellows MD, MRCP(U.K) [address]  [place]  [place],  [address]  Main: [phone]  Fax: [phone]   Gastroenterology Consultation  Referring Provider:     Sofie Hartigan, MD Primary Care Physician:  Derrick Hartigan, MD Primary Gastroenterologist:  Dr. Jonathon Mckenzie  Reason for Consultation:     Abdominal pain        HPI:   Derrick Mckenzie is a 70 y.o. y/o male referred for consultation & management  by Dr. Ellison Mckenzie, Derrick Noa, MD.    I last seen him in 2017 when I performed an upper endoscopy and found to have a large gastric ulcer likely secondary to NSAID use.  He was supposed to follow-up with me but did not do so.  He comes here to see me for abdominal pain for few weeks duration constant with some intermittent in resolution in the day.  Feels like a hunger pain.  Better when he eats.  Takes ibuprofen once a day 3 times a week for pains for many years.  Has noticed black stool last week.  No recent CBC.  No other complaints.  Past Medical History:  Diagnosis Date  . Arthritis    knees  . Complication of anesthesia    aggitation after knee surgery  . Family history of adverse reaction to anesthesia    brother - hallucinations  . GERD (gastroesophageal reflux disease)   . Hx of smoking 06/26/2015  . Hyperlipidemia   . Hypertension   . Hypothyroidism   . Kidney stone   . Morbid obesity ([place])   . Murmur   . Prediabetes 10/14/2016  . Sleep apnea    score of 5 on apnea screen    Past Surgical History:  Procedure Laterality Date  . ESOPHAGOGASTRODUODENOSCOPY (EGD) WITH PROPOFOL N/A 10/05/2016   Procedure: ESOPHAGOGASTRODUODENOSCOPY (EGD) WITH PROPOFOL;  Surgeon: Derrick Bellows, MD;  Location: [facility];  Service: Endoscopy;  Laterality: N/A;  sleep apnea  . HERNIA REPAIR    . REPLACEMENT TOTAL KNEE Right 2017  . TOTAL KNEE ARTHROPLASTY  1973    Prior to Admission medications   Medication Sig Start Date End Date Taking? Authorizing  Provider  albuterol (PROVENTIL HFA;VENTOLIN HFA) 108 (90 Base) MCG/ACT inhaler Inhale 2 puffs into the lungs every 6 (six) hours as needed for wheezing or shortness of breath. 01/21/19   Lannie Fields, PA-C  amLODipine (NORVASC) 5 MG tablet Take 5 mg by mouth daily. Patient not taking: Reported on 10/19/2020 01/20/20   [provider]  aspirin EC 81 MG tablet Take 81 mg by mouth daily. Swallow whole.    [provider]  atorvastatin (LIPITOR) 80 MG tablet  08/30/20   [provider]  clopidogrel (PLAVIX) 75 MG tablet Take 1 tablet (75 mg total) by mouth daily. 02/29/20   Patrecia Pour, MD  Fluticasone-Salmeterol (ADVAIR) 100-50 MCG/DOSE AEPB Inhale 1 puff into the lungs 2 (two) times daily. Patient not taking: Reported on 05/08/2020    [provider]  furosemide (LASIX) 40 MG tablet Take 40 mg by mouth daily.  02/14/20   [provider]  Javier Docker Oil 1000 MG CAPS Take 2,000 mg by mouth 3 (three) times daily.    [provider]  levothyroxine (SYNTHROID) 100 MCG tablet Take 100 mcg by mouth daily. 02/07/20   [provider]  metFORMIN (GLUCOPHAGE-XR) 500 MG 24 hr tablet Take 500 mg by mouth in the morning and at bedtime.  [provider]  Multiple Vitamin (MULTIVITAMIN WITH MINERALS) TABS tablet Take 1 tablet by mouth daily. Patient not taking: Reported on 05/08/2020    [provider]  omega-3 acid ethyl esters (LOVAZA) 1 g capsule Take 2 g by mouth daily.  Patient not taking: Reported on 10/19/2020    [provider]  traZODone (DESYREL) 50 MG tablet Take by mouth. 10/09/20 10/09/21  [provider]    Family History  Problem Relation Age of Onset  . Cancer Mother        leukemia  . Diabetes Brother   . Blindness Brother   . Heart attack Maternal Grandfather   . Prostate cancer Neg Hx   . Bladder Cancer Neg Hx   . Kidney cancer Neg Hx      Social History   Tobacco Use  . Smoking status:  Former Smoker    Packs/day: 0.50    Years: 10.00    Pack years: 5.00    Types: Cigarettes    Quit date: 02/03/2016    Years since quitting: 5.0  . Smokeless tobacco: Never Used  . Tobacco comment: Quit in 2017  Vaping Use  . Vaping Use: Never used  Substance Use Topics  . Alcohol use: Yes    Alcohol/week: 0.0 standard drinks    Comment: socially - Holidays  . Drug use: No    Allergies as of 02/16/2021  . (No Known Allergies)    Review of Systems:    All systems reviewed and negative except where noted in HPI.   Physical Exam:  BP 116/75   Pulse 70   Ht 5\' 8"  (1.727 m)   Wt 292 lb (132.5 kg)   BMI 44.40 kg/m  No LMP for male patient. Psych:  Alert and cooperative. Normal mood and affect. General:   Alert,  Well-developed, well-nourished, pleasant and cooperative in NAD Head:  Normocephalic and atraumatic. Eyes:  Sclera clear, no icterus.   Conjunctiva pink. Lungs:  Respirations even and unlabored.  Clear throughout to auscultation.   No wheezes, crackles, or rhonchi. No acute distress. Heart:  Regular rate and rhythm; no murmurs, clicks, rubs, or gallops. Abdomen:  Normal bowel sounds.  No bruits.  Soft, non-tender and non-distended without masses, hepatosplenomegaly or hernias noted.  No guarding or rebound tenderness.    Neurologic:  Alert and oriented x3;  grossly normal neurologically. Skin:  Intact without significant lesions or rashes. No jaundice. Lymph Nodes:  No significant cervical adenopathy. Psych:  Alert and cooperative. Normal mood and affect.  Imaging Studies: No results found.  Assessment and Plan:   Derrick Mckenzie is a 70 y.o. y/o male has been referred for abdominal pain.  Ongoing for last few weeks.  Very likely he had a gastric ulcer secondary to NSAID use.  Not on a PPI.  1 episode of what appears to be melena.  Plan 1.  Stop ibuprofen today 2.  H. pylori breath test 3.  Prilosec 40 mg twice daily 4.  EGD this Friday 5.  CBC 6.  Explained  to him that he is high risk due to his cardiac history as well as sleep apnea.   I have discussed alternative options, risks & benefits,  which include, but are not limited to, bleeding, infection, perforation,respiratory complication & drug reaction.  The patient agrees with this plan & written consent will be obtained.     Follow up in 6-week  Dr Derrick Bellows MD,MRCP(U.K)

## 2021-02-17 DIAGNOSIS — Z0100 Encounter for examination of eyes and vision without abnormal findings: Secondary | ICD-10-CM | POA: Diagnosis not present

## 2021-02-17 DIAGNOSIS — E113213 Type 2 diabetes mellitus with mild nonproliferative diabetic retinopathy with macular edema, bilateral: Secondary | ICD-10-CM | POA: Diagnosis not present

## 2021-02-18 LAB — CBC WITH DIFFERENTIAL/PLATELET
Basophils Absolute: 0.1 10*3/uL (ref 0.0–0.2)
Basos: 1 %
EOS (ABSOLUTE): 0.2 10*3/uL (ref 0.0–0.4)
Eos: 3 %
Hematocrit: 35.7 % — ABNORMAL LOW (ref 37.5–51.0)
Hemoglobin: 12 g/dL — ABNORMAL LOW (ref 13.0–17.7)
Immature Grans (Abs): 0.1 10*3/uL (ref 0.0–0.1)
Immature Granulocytes: 1 %
Lymphocytes Absolute: 1 10*3/uL (ref 0.7–3.1)
Lymphs: 12 %
MCH: 29.9 pg (ref 26.6–33.0)
MCHC: 33.6 g/dL (ref 31.5–35.7)
MCV: 89 fL (ref 79–97)
Monocytes Absolute: 0.5 10*3/uL (ref 0.1–0.9)
Monocytes: 7 %
Neutrophils Absolute: 6 10*3/uL (ref 1.4–7.0)
Neutrophils: 76 %
Platelets: 240 10*3/uL (ref 150–450)
RBC: 4.02 x10E6/uL — ABNORMAL LOW (ref 4.14–5.80)
RDW: 14.5 % (ref 11.6–15.4)
WBC: 7.8 10*3/uL (ref 3.4–10.8)

## 2021-02-18 LAB — H. PYLORI BREATH TEST: H pylori Breath Test: NEGATIVE

## 2021-02-24 ENCOUNTER — Other Ambulatory Visit
Admission: RE | Admit: 2021-02-24 | Discharge: 2021-02-24 | Disposition: A | Discharge status: Home / Self Care | Payer: Medicare HMO | Source: Ambulatory Visit | Attending: Gastroenterology | Admitting: Gastroenterology

## 2021-02-24 ENCOUNTER — Other Ambulatory Visit: Payer: Self-pay

## 2021-02-24 DIAGNOSIS — Z01812 Encounter for preprocedural laboratory examination: Secondary | ICD-10-CM | POA: Diagnosis not present

## 2021-02-24 DIAGNOSIS — Z20822 Contact with and (suspected) exposure to covid-19: Secondary | ICD-10-CM | POA: Insufficient documentation

## 2021-02-24 LAB — SARS CORONAVIRUS 2 (TAT 6-24 HRS): SARS Coronavirus 2: NEGATIVE

## 2021-02-25 ENCOUNTER — Encounter: Payer: Self-pay | Admitting: Gastroenterology

## 2021-02-26 ENCOUNTER — Other Ambulatory Visit: Payer: Self-pay

## 2021-02-26 ENCOUNTER — Encounter
Admission: RE | Disposition: A | Discharge status: Home / Self Care | Payer: Self-pay | Source: Home / Self Care | Attending: Gastroenterology

## 2021-02-26 ENCOUNTER — Ambulatory Visit
Admission: RE | Admit: 2021-02-26 | Discharge: 2021-02-26 | Disposition: A | Discharge status: Home / Self Care | Payer: Medicare HMO | Attending: Gastroenterology | Admitting: Gastroenterology

## 2021-02-26 DIAGNOSIS — R109 Unspecified abdominal pain: Secondary | ICD-10-CM

## 2021-02-26 SURGERY — ESOPHAGOGASTRODUODENOSCOPY (EGD) WITH PROPOFOL
Anesthesia: General

## 2021-02-26 NOTE — Progress Notes (Signed)
Procedure was cancelled due to patient still taking his blood thinner medication. Patient stated he would reschedule only if he does not have to take the covid test again. Called the Endo unit and spoke with Trish. She said if we can get him scheduled within 7 days it would be fine. Instruction have been sent and blood thinner request sent via right fax.

## 2021-03-01 ENCOUNTER — Telehealth: Payer: Self-pay

## 2021-03-01 NOTE — Telephone Encounter (Signed)
Returned patients call. Informed him I spoke with Trish from the Endo unit regarding taking another covid test. She explained to me if we could get him schedule within the 7 day mark. He will not have to take another covid test. Pt verbalized understanding.

## 2021-03-02 ENCOUNTER — Other Ambulatory Visit: Admission: RE | Admit: 2021-03-02 | Payer: Medicare HMO | Source: Ambulatory Visit

## 2021-03-04 ENCOUNTER — Encounter: Admission: RE | Payer: Self-pay | Source: Home / Self Care

## 2021-03-04 ENCOUNTER — Ambulatory Visit: Admission: RE | Admit: 2021-03-04 | Payer: Medicare HMO | Source: Home / Self Care | Admitting: Gastroenterology

## 2021-03-04 SURGERY — ESOPHAGOGASTRODUODENOSCOPY (EGD) WITH PROPOFOL
Anesthesia: General

## 2021-03-15 DIAGNOSIS — H2512 Age-related nuclear cataract, left eye: Secondary | ICD-10-CM | POA: Diagnosis not present

## 2021-03-16 ENCOUNTER — Other Ambulatory Visit: Payer: Self-pay

## 2021-03-16 DIAGNOSIS — K921 Melena: Secondary | ICD-10-CM

## 2021-03-16 MED ORDER — OMEPRAZOLE 40 MG PO CPDR
40.0000 mg | DELAYED_RELEASE_CAPSULE | Freq: Two times a day (BID) | ORAL | 1 refills | Status: AC
Start: 2021-03-16 — End: ?

## 2021-03-16 NOTE — Telephone Encounter (Signed)
Patient has requested Rx be sent through [facility] mail delivery.

## 2021-03-22 ENCOUNTER — Encounter: Payer: Self-pay | Admitting: Ophthalmology

## 2021-03-22 ENCOUNTER — Other Ambulatory Visit: Payer: Self-pay

## 2021-03-23 NOTE — Anesthesia Preprocedure Evaluation (Deleted)
Anesthesia Evaluation  Patient identified by MRN, date of birth, ID band Patient awake    Reviewed: Allergy & Precautions, NPO status   Airway Mallampati: II  TM Distance: >3 FB     Dental   Pulmonary sleep apnea , former smoker,    Pulmonary exam normal        Cardiovascular hypertension,  Rhythm:Regular Rate:Normal     Neuro/Psych CVA (01/2020)    GI/Hepatic PUD, GERD  ,  Endo/Other  diabetes, Type 2Hypothyroidism Morbid obesity  Renal/GU      Musculoskeletal  (+) Arthritis ,   Abdominal   Peds  Hematology   Anesthesia Other Findings   Reproductive/Obstetrics                             Anesthesia Physical Anesthesia Plan  ASA: III  Anesthesia Plan: MAC   Post-op Pain Management:    Induction: Intravenous  PONV Risk Score and Plan: TIVA, Midazolam and Treatment may vary due to age or medical condition  Airway Management Planned: Natural Airway and Nasal Cannula  Additional Equipment:   Intra-op Plan:   Post-operative Plan:   Informed Consent: I have reviewed the patients History and Physical, chart, labs and discussed the procedure including the risks, benefits and alternatives for the proposed anesthesia with the patient or authorized representative who has indicated his/her understanding and acceptance.       Plan Discussed with: CRNA  Anesthesia Plan Comments:         Anesthesia Quick Evaluation

## 2021-03-26 NOTE — Discharge Instructions (Signed)

## 2021-03-30 ENCOUNTER — Encounter: Payer: Self-pay | Admitting: Ophthalmology

## 2021-03-30 ENCOUNTER — Ambulatory Visit: Payer: Medicare HMO | Admitting: Anesthesiology

## 2021-03-30 ENCOUNTER — Other Ambulatory Visit: Payer: Self-pay

## 2021-03-30 ENCOUNTER — Encounter
Admission: RE | Disposition: A | Discharge status: Home / Self Care | Payer: Self-pay | Source: Home / Self Care | Attending: Ophthalmology

## 2021-03-30 ENCOUNTER — Ambulatory Visit
Admission: RE | Admit: 2021-03-30 | Discharge: 2021-03-30 | Disposition: A | Discharge status: Home / Self Care | Payer: Medicare HMO | Attending: Ophthalmology | Admitting: Ophthalmology

## 2021-03-30 DIAGNOSIS — Z951 Presence of aortocoronary bypass graft: Secondary | ICD-10-CM | POA: Insufficient documentation

## 2021-03-30 DIAGNOSIS — Z87891 Personal history of nicotine dependence: Secondary | ICD-10-CM | POA: Insufficient documentation

## 2021-03-30 DIAGNOSIS — Z79899 Other long term (current) drug therapy: Secondary | ICD-10-CM | POA: Diagnosis not present

## 2021-03-30 DIAGNOSIS — H2512 Age-related nuclear cataract, left eye: Secondary | ICD-10-CM | POA: Insufficient documentation

## 2021-03-30 DIAGNOSIS — Z7982 Long term (current) use of aspirin: Secondary | ICD-10-CM | POA: Insufficient documentation

## 2021-03-30 DIAGNOSIS — E1136 Type 2 diabetes mellitus with diabetic cataract: Secondary | ICD-10-CM | POA: Diagnosis not present

## 2021-03-30 DIAGNOSIS — H25812 Combined forms of age-related cataract, left eye: Secondary | ICD-10-CM | POA: Diagnosis not present

## 2021-03-30 DIAGNOSIS — Z7984 Long term (current) use of oral hypoglycemic drugs: Secondary | ICD-10-CM | POA: Diagnosis not present

## 2021-03-30 DIAGNOSIS — Z952 Presence of prosthetic heart valve: Secondary | ICD-10-CM | POA: Insufficient documentation

## 2021-03-30 DIAGNOSIS — Z79890 Hormone replacement therapy (postmenopausal): Secondary | ICD-10-CM | POA: Insufficient documentation

## 2021-03-30 DIAGNOSIS — Z96651 Presence of right artificial knee joint: Secondary | ICD-10-CM | POA: Insufficient documentation

## 2021-03-30 HISTORY — PX: CATARACT EXTRACTION W/PHACO: SHX586

## 2021-03-30 LAB — GLUCOSE, CAPILLARY
Glucose-Capillary: 127 mg/dL — ABNORMAL HIGH (ref 70–99)
Glucose-Capillary: 126 mg/dL — ABNORMAL HIGH (ref 70–99)

## 2021-03-30 SURGERY — PHACOEMULSIFICATION, CATARACT, WITH IOL INSERTION
Anesthesia: Monitor Anesthesia Care | Site: Eye | Laterality: Left

## 2021-03-30 MED ORDER — MOXIFLOXACIN HCL 0.5 % OP SOLN
OPHTHALMIC | Status: DC | PRN
  Administered 2021-03-30: 0.2 mL via OPHTHALMIC

## 2021-03-30 MED ORDER — NA CHONDROIT SULF-NA HYALURON 40-17 MG/ML IO SOLN
INTRAOCULAR | Status: DC | PRN
  Administered 2021-03-30: 1 mL via INTRAOCULAR

## 2021-03-30 MED ORDER — TETRACAINE HCL 0.5 % OP SOLN
1.0000 [drp] | OPHTHALMIC | Status: DC | PRN
  Administered 2021-03-30 (×3): 1 [drp] via OPHTHALMIC

## 2021-03-30 MED ORDER — BRIMONIDINE TARTRATE-TIMOLOL 0.2-0.5 % OP SOLN
OPHTHALMIC | Status: DC | PRN
  Administered 2021-03-30: 1 [drp] via OPHTHALMIC

## 2021-03-30 MED ORDER — ACETAMINOPHEN 325 MG PO TABS: 325.0000 mg | ORAL_TABLET | ORAL | Status: DC | PRN

## 2021-03-30 MED ORDER — LIDOCAINE HCL (PF) 2 % IJ SOLN
INTRAOCULAR | Status: DC | PRN
  Administered 2021-03-30: 1 mL

## 2021-03-30 MED ORDER — ONDANSETRON HCL 4 MG/2ML IJ SOLN: 4.0000 mg | Freq: Once | INTRAMUSCULAR | Status: DC | PRN

## 2021-03-30 MED ORDER — FENTANYL CITRATE (PF) 100 MCG/2ML IJ SOLN
INTRAMUSCULAR | Status: DC | PRN
  Administered 2021-03-30: 50 ug via INTRAVENOUS

## 2021-03-30 MED ORDER — ACETAMINOPHEN 160 MG/5ML PO SOLN: 325.0000 mg | ORAL | Status: DC | PRN

## 2021-03-30 MED ORDER — EPINEPHRINE PF 1 MG/ML IJ SOLN
INTRAOCULAR | Status: DC | PRN
  Administered 2021-03-30: 54 mL via OPHTHALMIC

## 2021-03-30 MED ORDER — MIDAZOLAM HCL 2 MG/2ML IJ SOLN
INTRAMUSCULAR | Status: DC | PRN
  Administered 2021-03-30 (×2): 1 mg via INTRAVENOUS

## 2021-03-30 MED ORDER — ARMC OPHTHALMIC DILATING DROPS
1.0000 "application " | OPHTHALMIC | Status: DC | PRN
  Administered 2021-03-30 (×3): 1 via OPHTHALMIC

## 2021-03-30 SURGICAL SUPPLY — 15 items
CANNULA ANT/CHMB 27GA (MISCELLANEOUS) ×4 IMPLANT
GLOVE SURG TRIUMPH 8.0 PF LTX (GLOVE) ×2 IMPLANT
GOWN STRL REUS W/ TWL LRG LVL3 (GOWN DISPOSABLE) ×2 IMPLANT
GOWN STRL REUS W/TWL LRG LVL3 (GOWN DISPOSABLE) ×4
LENS IOL TECNIS EYHANCE 24.5 (Intraocular Lens) ×2 IMPLANT
MARKER SKIN DUAL TIP RULER LAB (MISCELLANEOUS) ×2 IMPLANT
NEEDLE FILTER BLUNT 18X 1/2SAF (NEEDLE) ×1
NEEDLE FILTER BLUNT 18X1 1/2 (NEEDLE) ×1 IMPLANT
PACK EYE AFTER SURG (MISCELLANEOUS) ×2 IMPLANT
PACK OPTHALMIC (MISCELLANEOUS) ×2 IMPLANT
PACK PORFILIO (MISCELLANEOUS) ×2 IMPLANT
SYR 3ML LL SCALE MARK (SYRINGE) ×2 IMPLANT
SYR TB 1ML LUER SLIP (SYRINGE) ×2 IMPLANT
WATER STERILE IRR 250ML POUR (IV SOLUTION) ×2 IMPLANT
WIPE NON LINTING 3.25X3.25 (MISCELLANEOUS) ×2 IMPLANT

## 2021-03-30 NOTE — Transfer of Care (Signed)
Immediate Anesthesia Transfer of Care Note  Patient: Derrick Mckenzie  Procedure(s) Performed: CATARACT EXTRACTION PHACO AND INTRAOCULAR LENS PLACEMENT (IOC) LEFT DIABETIC 5.27 00:28.8 (Left Eye)  Patient Location: PACU  Anesthesia Type: MAC  Level of Consciousness: awake, alert  and patient cooperative  Airway and Oxygen Therapy: Patient Spontanous Breathing   Post-op Assessment: Post-op Vital signs reviewed, Patient's Cardiovascular Status Stable, Respiratory Function Stable, Patent Airway and No signs of Nausea or vomiting  Post-op Vital Signs: Reviewed and stable  Complications: No complications documented.

## 2021-03-30 NOTE — H&P (Signed)
[facility]   Primary Care Physician:  Sofie Hartigan, MD Ophthalmologist: Dr. George Ina  Pre-Procedure History & Physical: HPI:  Derrick Mckenzie is a 70 y.o. male here for cataract surgery.   Past Medical History:  Diagnosis Date  . Arthritis    knees  . Complication of anesthesia    aggitation after knee surgery  . CVA (cerebral vascular accident) ([place]) 02/26/2020  . Family history of adverse reaction to anesthesia    brother - hallucinations  . GERD (gastroesophageal reflux disease)   . Hx of smoking 06/26/2015  . Hyperlipidemia   . Hypertension   . Hypothyroidism   . Kidney stone   . Morbid obesity ([place])   . Murmur   . Prediabetes 10/14/2016  . Sleep apnea    score of 5 on apnea screen    Past Surgical History:  Procedure Laterality Date  . AORTIC VALVE REPLACEMENT  08/25/2020  . CORONARY ARTERY BYPASS GRAFT  08/25/2020   X2  . ESOPHAGOGASTRODUODENOSCOPY (EGD) WITH PROPOFOL N/A 10/05/2016   Procedure: ESOPHAGOGASTRODUODENOSCOPY (EGD) WITH PROPOFOL;  Surgeon: Jonathon Bellows, MD;  Location: [facility];  Service: Endoscopy;  Laterality: N/A;  sleep apnea  . HERNIA REPAIR    . JOINT REPLACEMENT    . REPLACEMENT TOTAL KNEE Right 2017  . TOTAL KNEE ARTHROPLASTY  1973    Prior to Admission medications   Medication Sig Start Date End Date Taking? Authorizing Provider  Ascorbic Acid (VITAMIN C PO) Take by mouth.   Yes [provider]  aspirin EC 81 MG tablet Take 81 mg by mouth daily. Swallow whole.   Yes [provider]  atorvastatin (LIPITOR) 80 MG tablet  08/30/20  Yes [provider]  clopidogrel (PLAVIX) 75 MG tablet Take 1 tablet (75 mg total) by mouth daily. 02/29/20  Yes Patrecia Pour, MD  ELDERBERRY PO Take by mouth daily.   Yes [provider]  furosemide (LASIX) 40 MG tablet Take 40 mg by mouth daily.  02/14/20  Yes [provider]  Javier Docker Oil 1000 MG CAPS Take 2,000 mg by mouth 3 (three) times daily.    Yes [provider]  levothyroxine (SYNTHROID) 100 MCG tablet Take 100 mcg by mouth daily. 02/07/20  Yes [provider]  metFORMIN (GLUCOPHAGE-XR) 500 MG 24 hr tablet Take 500 mg by mouth in the morning and at bedtime.   Yes [provider]  omeprazole (PRILOSEC) 40 MG capsule Take 1 capsule (40 mg total) by mouth 2 (two) times daily. 03/16/21  Yes Jonathon Bellows, MD  traZODone (DESYREL) 50 MG tablet Take by mouth. 10/09/20 10/09/21 Yes [provider]  VITAMIN D PO Take by mouth.   Yes [provider]    Allergies as of 02/18/2021  . (No Known Allergies)    Family History  Problem Relation Age of Onset  . Cancer Mother        leukemia  . Diabetes Brother   . Blindness Brother   . Heart attack Maternal Grandfather   . Prostate cancer Neg Hx   . Bladder Cancer Neg Hx   . Kidney cancer Neg Hx     Social History   Socioeconomic History  . Marital status: Married    Spouse name: Not on file  . Number of children: Not on file  . Years of education: Not on file  . Highest education level: Not on file  Occupational History  . Not on file  Tobacco Use  . Smoking  status: Former Smoker    Packs/day: 0.50    Years: 10.00    Pack years: 5.00    Types: Cigarettes    Quit date: 02/03/2016    Years since quitting: 5.1  . Smokeless tobacco: Never Used  . Tobacco comment: Quit in 2017  Vaping Use  . Vaping Use: Never used  Substance and Sexual Activity  . Alcohol use: Yes    Alcohol/week: 0.0 standard drinks    Comment: socially - Holidays  . Drug use: No  . Sexual activity: Yes  Other Topics Concern  . Not on file  Social History Narrative  . Not on file   Social Determinants of Health   Financial Resource Strain: Not on file  Food Insecurity: Not on file  Transportation Needs: Not on file  Physical Activity: Not on file  Stress: Not on file  Social Connections: Not on file  Intimate Partner Violence: Not on file    Review of  Systems: See HPI, otherwise negative ROS  Physical Exam: BP (!) 158/98   Pulse 77   Temp (!) 96.2 F (35.7 C) (Temporal)   Resp 20   Ht 5\' 8"  (1.727 m)   Wt 130.7 kg   SpO2 99%   BMI 43.81 kg/m  General:   Alert,  pleasant and cooperative in NAD Head:  Normocephalic and atraumatic. Respiratory:  Normal work of breathing. Cardiovascular:  RRR  Impression/Plan: Derrick Mckenzie is here for cataract surgery.  Risks, benefits, limitations, and alternatives regarding cataract surgery have been reviewed with the patient.  Questions have been answered.  All parties agreeable.   Birder Robson, MD  03/30/2021, 7:18 AM

## 2021-03-30 NOTE — Op Note (Signed)
PREOPERATIVE DIAGNOSIS:  Nuclear sclerotic cataract of the left eye.   POSTOPERATIVE DIAGNOSIS:  Nuclear sclerotic cataract of the left eye.   OPERATIVE PROCEDURE:@   SURGEON:  Birder Robson, MD.   ANESTHESIA:  Anesthesiologist: Veda Canning, MD CRNA: Vanetta Shawl, CRNA  1.      Managed anesthesia care. 2.     0.58ml of Shugarcaine was instilled following the paracentesis   COMPLICATIONS:  None.   TECHNIQUE:   Stop and chop   DESCRIPTION OF PROCEDURE:  The patient was examined and consented in the preoperative holding area where the aforementioned topical anesthesia was applied to the left eye and then brought back to the Operating Room where the left eye was prepped and draped in the usual sterile ophthalmic fashion and a lid speculum was placed. A paracentesis was created with the side port blade and the anterior chamber was filled with viscoelastic. A near clear corneal incision was performed with the steel keratome. A continuous curvilinear capsulorrhexis was performed with a cystotome followed by the capsulorrhexis forceps. Hydrodissection and hydrodelineation were carried out with BSS on a blunt cannula. The lens was removed in a stop and chop  technique and the remaining cortical material was removed with the irrigation-aspiration handpiece. The capsular bag was inflated with viscoelastic and the Technis [id number] lens was placed in the capsular bag without complication. The remaining viscoelastic was removed from the eye with the irrigation-aspiration handpiece. The wounds were hydrated. The anterior chamber was flushed with BSS and the eye was inflated to physiologic pressure. 0.67ml Vigamox was placed in the anterior chamber. The wounds were found to be water tight. The eye was dressed with Combigan. The patient was given protective glasses to wear throughout the day and a shield with which to sleep tonight. The patient was also given drops with which to begin a drop regimen today and  will follow-up with me in one day. Implant Name Type Inv. Item Serial No. Manufacturer Lot No. LRB No. Used Action  LENS IOL TECNIS EYHANCE 24.5 - [id number] Intraocular Lens LENS IOL TECNIS EYHANCE 24.5 [id number] JOHNSON   Left 1 Implanted    Procedure(s): CATARACT EXTRACTION PHACO AND INTRAOCULAR LENS PLACEMENT (IOC) LEFT DIABETIC 5.27 00:28.8 (Left)  Electronically signed: Birder Robson 03/30/2021 7:44 AM

## 2021-03-30 NOTE — Anesthesia Postprocedure Evaluation (Signed)
Anesthesia Post Note  Patient: Derrick Mckenzie  Procedure(s) Performed: CATARACT EXTRACTION PHACO AND INTRAOCULAR LENS PLACEMENT (IOC) LEFT DIABETIC 5.27 00:28.8 (Left Eye)     Patient location during evaluation: PACU Anesthesia Type: MAC Level of consciousness: awake Pain management: pain level controlled Vital Signs Assessment: post-procedure vital signs reviewed and stable Respiratory status: respiratory function stable Cardiovascular status: stable Postop Assessment: no apparent nausea or vomiting Anesthetic complications: no   No complications documented.  Veda Canning

## 2021-03-30 NOTE — Anesthesia Preprocedure Evaluation (Addendum)
Anesthesia Evaluation  Patient identified by MRN, date of birth, ID band Patient awake    Reviewed: Allergy & Precautions, NPO status   Airway Mallampati: II       Dental   Pulmonary former smoker,    breath sounds clear to auscultation       Cardiovascular hypertension, + CABG (2021)  + Valvular Problems/Murmurs (s/p AVR 2021)  Rhythm:Regular Rate:Normal     Neuro/Psych CVA    GI/Hepatic PUD, GERD  ,  Endo/Other  diabetes, Type 2Hypothyroidism Morbid obesity  Renal/GU      Musculoskeletal  (+) Arthritis ,   Abdominal   Peds  Hematology   Anesthesia Other Findings   Reproductive/Obstetrics                            Anesthesia Physical Anesthesia Plan  ASA: III  Anesthesia Plan: MAC   Post-op Pain Management:    Induction: Intravenous  PONV Risk Score and Plan: TIVA, Midazolam and Treatment may vary due to age or medical condition  Airway Management Planned: Natural Airway and Nasal Cannula  Additional Equipment:   Intra-op Plan:   Post-operative Plan:   Informed Consent: I have reviewed the patients History and Physical, chart, labs and discussed the procedure including the risks, benefits and alternatives for the proposed anesthesia with the patient or authorized representative who has indicated his/her understanding and acceptance.       Plan Discussed with: CRNA  Anesthesia Plan Comments:         Anesthesia Quick Evaluation

## 2021-03-30 NOTE — Anesthesia Procedure Notes (Signed)
Procedure Name: MAC Date/Time: 03/30/2021 7:27 AM Performed by: Vanetta Shawl, CRNA Pre-anesthesia Checklist: Patient identified, Emergency Drugs available, Suction available, Timeout performed and Patient being monitored Patient Re-evaluated:Patient Re-evaluated prior to induction Oxygen Delivery Method: Nasal cannula Placement Confirmation: positive ETCO2

## 2021-03-31 ENCOUNTER — Encounter: Payer: Self-pay | Admitting: Ophthalmology

## 2021-04-05 ENCOUNTER — Encounter: Payer: Self-pay | Admitting: Ophthalmology

## 2021-04-05 DIAGNOSIS — H2511 Age-related nuclear cataract, right eye: Secondary | ICD-10-CM | POA: Diagnosis not present

## 2021-04-06 ENCOUNTER — Ambulatory Visit: Payer: Medicare HMO | Admitting: Gastroenterology

## 2021-04-12 NOTE — Discharge Instructions (Signed)

## 2021-04-13 ENCOUNTER — Encounter: Payer: Self-pay | Admitting: Ophthalmology

## 2021-04-13 ENCOUNTER — Ambulatory Visit
Admission: RE | Admit: 2021-04-13 | Discharge: 2021-04-13 | Disposition: A | Discharge status: Home / Self Care | Payer: Medicare HMO | Attending: Ophthalmology | Admitting: Ophthalmology

## 2021-04-13 ENCOUNTER — Encounter
Admission: RE | Disposition: A | Discharge status: Home / Self Care | Payer: Self-pay | Source: Home / Self Care | Attending: Ophthalmology

## 2021-04-13 ENCOUNTER — Ambulatory Visit: Payer: Medicare HMO | Admitting: Anesthesiology

## 2021-04-13 ENCOUNTER — Other Ambulatory Visit: Payer: Self-pay

## 2021-04-13 DIAGNOSIS — Z79890 Hormone replacement therapy (postmenopausal): Secondary | ICD-10-CM | POA: Diagnosis not present

## 2021-04-13 DIAGNOSIS — H25811 Combined forms of age-related cataract, right eye: Secondary | ICD-10-CM | POA: Diagnosis not present

## 2021-04-13 DIAGNOSIS — E1136 Type 2 diabetes mellitus with diabetic cataract: Secondary | ICD-10-CM | POA: Diagnosis not present

## 2021-04-13 DIAGNOSIS — Z8673 Personal history of transient ischemic attack (TIA), and cerebral infarction without residual deficits: Secondary | ICD-10-CM | POA: Insufficient documentation

## 2021-04-13 DIAGNOSIS — Z7902 Long term (current) use of antithrombotics/antiplatelets: Secondary | ICD-10-CM | POA: Insufficient documentation

## 2021-04-13 DIAGNOSIS — Z7982 Long term (current) use of aspirin: Secondary | ICD-10-CM | POA: Insufficient documentation

## 2021-04-13 DIAGNOSIS — Z87891 Personal history of nicotine dependence: Secondary | ICD-10-CM | POA: Insufficient documentation

## 2021-04-13 DIAGNOSIS — H2511 Age-related nuclear cataract, right eye: Secondary | ICD-10-CM | POA: Insufficient documentation

## 2021-04-13 DIAGNOSIS — Z952 Presence of prosthetic heart valve: Secondary | ICD-10-CM | POA: Diagnosis not present

## 2021-04-13 DIAGNOSIS — Z7984 Long term (current) use of oral hypoglycemic drugs: Secondary | ICD-10-CM | POA: Insufficient documentation

## 2021-04-13 DIAGNOSIS — Z951 Presence of aortocoronary bypass graft: Secondary | ICD-10-CM | POA: Insufficient documentation

## 2021-04-13 DIAGNOSIS — Z79899 Other long term (current) drug therapy: Secondary | ICD-10-CM | POA: Diagnosis not present

## 2021-04-13 DIAGNOSIS — Z96651 Presence of right artificial knee joint: Secondary | ICD-10-CM | POA: Insufficient documentation

## 2021-04-13 HISTORY — PX: CATARACT EXTRACTION W/PHACO: SHX586

## 2021-04-13 LAB — GLUCOSE, CAPILLARY
Glucose-Capillary: 132 mg/dL — ABNORMAL HIGH (ref 70–99)
Glucose-Capillary: 121 mg/dL — ABNORMAL HIGH (ref 70–99)

## 2021-04-13 SURGERY — PHACOEMULSIFICATION, CATARACT, WITH IOL INSERTION
Anesthesia: Monitor Anesthesia Care | Site: Eye | Laterality: Right

## 2021-04-13 MED ORDER — MIDAZOLAM HCL 2 MG/2ML IJ SOLN
INTRAMUSCULAR | Status: DC | PRN
  Administered 2021-04-13: 2 mg via INTRAVENOUS

## 2021-04-13 MED ORDER — TETRACAINE HCL 0.5 % OP SOLN
1.0000 [drp] | OPHTHALMIC | Status: DC | PRN
  Administered 2021-04-13 (×3): 1 [drp] via OPHTHALMIC

## 2021-04-13 MED ORDER — ACETAMINOPHEN 160 MG/5ML PO SOLN: 325.0000 mg | ORAL | Status: DC | PRN

## 2021-04-13 MED ORDER — BRIMONIDINE TARTRATE-TIMOLOL 0.2-0.5 % OP SOLN
OPHTHALMIC | Status: DC | PRN
  Administered 2021-04-13: 1 [drp] via OPHTHALMIC

## 2021-04-13 MED ORDER — LIDOCAINE HCL (PF) 2 % IJ SOLN
INTRAOCULAR | Status: DC | PRN
  Administered 2021-04-13: 1 mL

## 2021-04-13 MED ORDER — ACETAMINOPHEN 325 MG PO TABS: 325.0000 mg | ORAL_TABLET | ORAL | Status: DC | PRN

## 2021-04-13 MED ORDER — EPINEPHRINE PF 1 MG/ML IJ SOLN
INTRAOCULAR | Status: DC | PRN
  Administered 2021-04-13: 61 mL via OPHTHALMIC

## 2021-04-13 MED ORDER — ARMC OPHTHALMIC DILATING DROPS
1.0000 "application " | OPHTHALMIC | Status: DC | PRN
  Administered 2021-04-13 (×3): 1 via OPHTHALMIC

## 2021-04-13 MED ORDER — NA CHONDROIT SULF-NA HYALURON 40-17 MG/ML IO SOLN
INTRAOCULAR | Status: DC | PRN
  Administered 2021-04-13: 1 mL via INTRAOCULAR

## 2021-04-13 MED ORDER — FENTANYL CITRATE (PF) 100 MCG/2ML IJ SOLN
INTRAMUSCULAR | Status: DC | PRN
  Administered 2021-04-13: 50 ug via INTRAVENOUS

## 2021-04-13 MED ORDER — MOXIFLOXACIN HCL 0.5 % OP SOLN
OPHTHALMIC | Status: DC | PRN
  Administered 2021-04-13: 0.2 mL via OPHTHALMIC

## 2021-04-13 MED ORDER — LACTATED RINGERS IV SOLN: INTRAVENOUS | Status: DC

## 2021-04-13 SURGICAL SUPPLY — 16 items
CANNULA ANT/CHMB 27GA (MISCELLANEOUS) ×4 IMPLANT
GLOVE BIOGEL M 8.0 STRL (GLOVE) ×2 IMPLANT
GLOVE SURG TRIUMPH 8.0 PF LTX (GLOVE) ×2 IMPLANT
GOWN STRL REUS W/ TWL LRG LVL3 (GOWN DISPOSABLE) ×2 IMPLANT
GOWN STRL REUS W/TWL LRG LVL3 (GOWN DISPOSABLE) ×4
LENS IOL TECNIS EYHANCE 25.0 (Intraocular Lens) ×2 IMPLANT
MARKER SKIN DUAL TIP RULER LAB (MISCELLANEOUS) ×2 IMPLANT
NEEDLE FILTER BLUNT 18X 1/2SAF (NEEDLE) ×1
NEEDLE FILTER BLUNT 18X1 1/2 (NEEDLE) ×1 IMPLANT
PACK EYE AFTER SURG (MISCELLANEOUS) ×2 IMPLANT
PACK OPTHALMIC (MISCELLANEOUS) ×2 IMPLANT
PACK PORFILIO (MISCELLANEOUS) ×2 IMPLANT
SYR 3ML LL SCALE MARK (SYRINGE) ×2 IMPLANT
SYR TB 1ML LUER SLIP (SYRINGE) ×2 IMPLANT
WATER STERILE IRR 250ML POUR (IV SOLUTION) ×2 IMPLANT
WIPE NON LINTING 3.25X3.25 (MISCELLANEOUS) ×2 IMPLANT

## 2021-04-13 NOTE — Anesthesia Postprocedure Evaluation (Signed)
Anesthesia Post Note  Patient: Derrick Mckenzie  Procedure(s) Performed: CATARACT EXTRACTION PHACO AND INTRAOCULAR LENS PLACEMENT (IOC) RIGHT DIABETIC (Right Eye)     Patient location during evaluation: PACU Anesthesia Type: MAC Level of consciousness: awake and alert Pain management: pain level controlled Vital Signs Assessment: post-procedure vital signs reviewed and stable Respiratory status: spontaneous breathing, nonlabored ventilation, respiratory function stable and patient connected to nasal cannula oxygen Cardiovascular status: stable and blood pressure returned to baseline Postop Assessment: no apparent nausea or vomiting Anesthetic complications: no   No complications documented.  Trecia Rogers

## 2021-04-13 NOTE — Anesthesia Procedure Notes (Signed)
Procedure Name: MAC Date/Time: 04/13/2021 7:57 AM Performed by: Silvana Newness, CRNA Pre-anesthesia Checklist: Patient identified, Emergency Drugs available, Suction available, Patient being monitored and Timeout performed Patient Re-evaluated:Patient Re-evaluated prior to induction Oxygen Delivery Method: Nasal cannula Placement Confirmation: positive ETCO2

## 2021-04-13 NOTE — Transfer of Care (Signed)
Immediate Anesthesia Transfer of Care Note  Patient: Derrick Mckenzie  Procedure(s) Performed: CATARACT EXTRACTION PHACO AND INTRAOCULAR LENS PLACEMENT (IOC) RIGHT DIABETIC (Right Eye)  Patient Location: PACU  Anesthesia Type: MAC  Level of Consciousness: awake, alert  and patient cooperative  Airway and Oxygen Therapy: Patient Spontanous Breathing and Patient connected to supplemental oxygen  Post-op Assessment: Post-op Vital signs reviewed, Patient's Cardiovascular Status Stable, Respiratory Function Stable, Patent Airway and No signs of Nausea or vomiting  Post-op Vital Signs: Reviewed and stable  Complications: No complications documented.

## 2021-04-13 NOTE — Anesthesia Preprocedure Evaluation (Signed)
Anesthesia Evaluation  Patient identified by MRN, date of birth, ID band Patient awake    Reviewed: Allergy & Precautions, H&P , NPO status , Patient's Chart, lab work & pertinent test results, reviewed documented beta blocker date and time   Airway Mallampati: II  TM Distance: >3 FB Neck ROM: full    Dental no notable dental hx.    Pulmonary sleep apnea , former smoker,    Pulmonary exam normal breath sounds clear to auscultation       Cardiovascular Exercise Tolerance: Good hypertension, Normal cardiovascular exam Rhythm:regular Rate:Normal     Neuro/Psych negative neurological ROS  negative psych ROS   GI/Hepatic Neg liver ROS, PUD, GERD  ,  Endo/Other  diabetes, Type 2Hypothyroidism Morbid obesity  Renal/GU negative Renal ROS  negative genitourinary   Musculoskeletal   Abdominal   Peds  Hematology negative hematology ROS (+)   Anesthesia Other Findings   Reproductive/Obstetrics negative OB ROS                             Anesthesia Physical Anesthesia Plan  ASA: III  Anesthesia Plan: MAC   Post-op Pain Management:    Induction:   PONV Risk Score and Plan:   Airway Management Planned:   Additional Equipment:   Intra-op Plan:   Post-operative Plan:   Informed Consent: I have reviewed the patients History and Physical, chart, labs and discussed the procedure including the risks, benefits and alternatives for the proposed anesthesia with the patient or authorized representative who has indicated his/her understanding and acceptance.     Dental Advisory Given  Plan Discussed with: CRNA  Anesthesia Plan Comments:         Anesthesia Quick Evaluation

## 2021-04-13 NOTE — H&P (Signed)
[facility]   Primary Care Physician:  Sofie Hartigan, MD Ophthalmologist: Dr. George Ina  Pre-Procedure History & Physical: HPI:  Derrick Mckenzie is a 70 y.o. male here for cataract surgery.   Past Medical History:  Diagnosis Date  . Arthritis    knees  . Complication of anesthesia    aggitation after knee surgery  . CVA (cerebral vascular accident) ([place]) 02/26/2020  . Family history of adverse reaction to anesthesia    brother - hallucinations  . GERD (gastroesophageal reflux disease)   . Hx of smoking 06/26/2015  . Hyperlipidemia   . Hypertension   . Hypothyroidism   . Kidney stone   . Morbid obesity ([place])   . Murmur   . Prediabetes 10/14/2016  . Sleep apnea    score of 5 on apnea screen    Past Surgical History:  Procedure Laterality Date  . AORTIC VALVE REPLACEMENT  08/25/2020  . CATARACT EXTRACTION W/PHACO Left 03/30/2021   Procedure: CATARACT EXTRACTION PHACO AND INTRAOCULAR LENS PLACEMENT (IOC) LEFT DIABETIC 5.27 00:28.8;  Surgeon: Birder Robson, MD;  Location: [place];  Service: Ophthalmology;  Laterality: Left;  . CORONARY ARTERY BYPASS GRAFT  08/25/2020   X2  . ESOPHAGOGASTRODUODENOSCOPY (EGD) WITH PROPOFOL N/A 10/05/2016   Procedure: ESOPHAGOGASTRODUODENOSCOPY (EGD) WITH PROPOFOL;  Surgeon: Jonathon Bellows, MD;  Location: [place];  Service: Endoscopy;  Laterality: N/A;  sleep apnea  . HERNIA REPAIR    . JOINT REPLACEMENT    . REPLACEMENT TOTAL KNEE Right 2017  . TOTAL KNEE ARTHROPLASTY  1973    Prior to Admission medications   Medication Sig Start Date End Date Taking? Authorizing Provider  Ascorbic Acid (VITAMIN C PO) Take by mouth.   Yes [provider]  aspirin EC 81 MG tablet Take 81 mg by mouth daily. Swallow whole.   Yes [provider]  atorvastatin (LIPITOR) 80 MG tablet  08/30/20  Yes [provider]  clopidogrel (PLAVIX) 75 MG tablet Take 1 tablet (75 mg total) by mouth daily. 02/29/20  Yes  Patrecia Pour, MD  ELDERBERRY PO Take by mouth daily.   Yes [provider]  furosemide (LASIX) 40 MG tablet Take 40 mg by mouth daily.  02/14/20  Yes [provider]  Javier Docker Oil 1000 MG CAPS Take 2,000 mg by mouth 3 (three) times daily.   Yes [provider]  levothyroxine (SYNTHROID) 100 MCG tablet Take 100 mcg by mouth daily. 02/07/20  Yes [provider]  metFORMIN (GLUCOPHAGE-XR) 500 MG 24 hr tablet Take 500 mg by mouth in the morning and at bedtime.   Yes [provider]  omeprazole (PRILOSEC) 40 MG capsule Take 1 capsule (40 mg total) by mouth 2 (two) times daily. 03/16/21  Yes Jonathon Bellows, MD  traZODone (DESYREL) 50 MG tablet Take by mouth. 10/09/20 10/09/21 Yes [provider]  VITAMIN D PO Take by mouth.   Yes [provider]    Allergies as of 02/18/2021  . (No Known Allergies)    Family History  Problem Relation Age of Onset  . Cancer Mother        leukemia  . Diabetes Brother   . Blindness Brother   . Heart attack Maternal Grandfather   . Prostate cancer Neg Hx   . Bladder Cancer Neg Hx   . Kidney cancer Neg Hx     Social History   Socioeconomic History  . Marital status: Married    Spouse name: Not on file  .  Number of children: Not on file  . Years of education: Not on file  . Highest education level: Not on file  Occupational History  . Not on file  Tobacco Use  . Smoking status: Former Smoker    Packs/day: 0.50    Years: 10.00    Pack years: 5.00    Types: Cigarettes    Quit date: 02/03/2016    Years since quitting: 5.1  . Smokeless tobacco: Never Used  . Tobacco comment: Quit in 2017  Vaping Use  . Vaping Use: Never used  Substance and Sexual Activity  . Alcohol use: Yes    Alcohol/week: 0.0 standard drinks    Comment: socially - Holidays  . Drug use: No  . Sexual activity: Yes  Other Topics Concern  . Not on file  Social History Narrative  . Not on file   Social Determinants of  Health   Financial Resource Strain: Not on file  Food Insecurity: Not on file  Transportation Needs: Not on file  Physical Activity: Not on file  Stress: Not on file  Social Connections: Not on file  Intimate Partner Violence: Not on file    Review of Systems: See HPI, otherwise negative ROS  Physical Exam: BP (!) 141/99   Pulse 79   Temp (!) 97.1 F (36.2 C) (Temporal)   Ht 5\' 8"  (1.727 m)   Wt 128.8 kg   SpO2 94%   BMI 43.18 kg/m  General:   Alert,  pleasant and cooperative in NAD Head:  Normocephalic and atraumatic. Respiratory:  Normal work of breathing. Cardiovascular:  RRR  Impression/Plan: Derrick Mckenzie is here for cataract surgery.  Risks, benefits, limitations, and alternatives regarding cataract surgery have been reviewed with the patient.  Questions have been answered.  All parties agreeable.   Birder Robson, MD  04/13/2021, 7:47 AM

## 2021-04-13 NOTE — Op Note (Signed)
PREOPERATIVE DIAGNOSIS:  Nuclear sclerotic cataract of the right eye.   POSTOPERATIVE DIAGNOSIS:  H25.11 Cataract   OPERATIVE PROCEDURE:@   SURGEON:  Birder Robson, MD.   ANESTHESIA:  Anesthesiologist: Rochel Brome, MD CRNA: Silvana Newness, CRNA  1.      Managed anesthesia care. 2.      0.32ml of Shugarcaine was instilled in the eye following the paracentesis.   COMPLICATIONS:  None.   TECHNIQUE:   Stop and chop   DESCRIPTION OF PROCEDURE:  The patient was examined and consented in the preoperative holding area where the aforementioned topical anesthesia was applied to the right eye and then brought back to the Operating Room where the right eye was prepped and draped in the usual sterile ophthalmic fashion and a lid speculum was placed. A paracentesis was created with the side port blade and the anterior chamber was filled with viscoelastic. A near clear corneal incision was performed with the steel keratome. A continuous curvilinear capsulorrhexis was performed with a cystotome followed by the capsulorrhexis forceps. Hydrodissection and hydrodelineation were carried out with BSS on a blunt cannula. The lens was removed in a stop and chop  technique and the remaining cortical material was removed with the irrigation-aspiration handpiece. The capsular bag was inflated with viscoelastic and the Technis [id number]  lens was placed in the capsular bag without complication. The remaining viscoelastic was removed from the eye with the irrigation-aspiration handpiece. The wounds were hydrated. The anterior chamber was flushed with BSS and the eye was inflated to physiologic pressure. 0.23ml of Vigamox was placed in the anterior chamber. The wounds were found to be water tight. The eye was dressed with Combigan. The patient was given protective glasses to wear throughout the day and a shield with which to sleep tonight. The patient was also given drops with which to begin a drop regimen today and will  follow-up with me in one day. Implant Name Type Inv. Item Serial No. Manufacturer Lot No. LRB No. Used Action  LENS IOL TECNIS EYHANCE 25.0 - [id number] Intraocular Lens LENS IOL TECNIS EYHANCE 25.0 [id number] JOHNSON   Right 1 Implanted   Procedure(s) with comments: CATARACT EXTRACTION PHACO AND INTRAOCULAR LENS PLACEMENT (IOC) RIGHT DIABETIC (Right) - 8.18 00:41.1  Electronically signed: Birder Robson 04/13/2021 8:09 AM

## 2021-04-14 ENCOUNTER — Encounter: Payer: Self-pay | Admitting: Ophthalmology

## 2021-05-07 ENCOUNTER — Ambulatory Visit (INDEPENDENT_AMBULATORY_CARE_PROVIDER_SITE_OTHER): Payer: Medicare HMO | Admitting: Vascular Surgery

## 2021-05-07 ENCOUNTER — Encounter (INDEPENDENT_AMBULATORY_CARE_PROVIDER_SITE_OTHER): Payer: Medicare HMO

## 2021-05-07 DIAGNOSIS — I2510 Atherosclerotic heart disease of native coronary artery without angina pectoris: Secondary | ICD-10-CM | POA: Diagnosis not present

## 2021-05-07 DIAGNOSIS — E782 Mixed hyperlipidemia: Secondary | ICD-10-CM | POA: Diagnosis not present

## 2021-05-07 DIAGNOSIS — M7918 Myalgia, other site: Secondary | ICD-10-CM | POA: Insufficient documentation

## 2021-05-07 DIAGNOSIS — Z125 Encounter for screening for malignant neoplasm of prostate: Secondary | ICD-10-CM | POA: Diagnosis not present

## 2021-05-07 DIAGNOSIS — E119 Type 2 diabetes mellitus without complications: Secondary | ICD-10-CM | POA: Diagnosis not present

## 2021-05-07 DIAGNOSIS — E038 Other specified hypothyroidism: Secondary | ICD-10-CM | POA: Diagnosis not present

## 2021-05-07 DIAGNOSIS — I10 Essential (primary) hypertension: Secondary | ICD-10-CM | POA: Diagnosis not present

## 2021-05-07 DIAGNOSIS — I5032 Chronic diastolic (congestive) heart failure: Secondary | ICD-10-CM | POA: Diagnosis not present

## 2021-05-07 DIAGNOSIS — R5381 Other malaise: Secondary | ICD-10-CM | POA: Diagnosis not present

## 2021-05-07 DIAGNOSIS — I25708 Atherosclerosis of coronary artery bypass graft(s), unspecified, with other forms of angina pectoris: Secondary | ICD-10-CM | POA: Diagnosis not present

## 2021-05-07 DIAGNOSIS — I110 Hypertensive heart disease with heart failure: Secondary | ICD-10-CM | POA: Diagnosis not present

## 2021-05-07 DIAGNOSIS — Z0000 Encounter for general adult medical examination without abnormal findings: Secondary | ICD-10-CM | POA: Diagnosis not present

## 2021-05-07 DIAGNOSIS — I2583 Coronary atherosclerosis due to lipid rich plaque: Secondary | ICD-10-CM | POA: Diagnosis not present

## 2021-05-07 DIAGNOSIS — R5383 Other fatigue: Secondary | ICD-10-CM | POA: Diagnosis not present

## 2021-05-10 DIAGNOSIS — E291 Testicular hypofunction: Secondary | ICD-10-CM | POA: Insufficient documentation

## 2021-05-10 DIAGNOSIS — E538 Deficiency of other specified B group vitamins: Secondary | ICD-10-CM | POA: Insufficient documentation

## 2021-05-13 ENCOUNTER — Other Ambulatory Visit (INDEPENDENT_AMBULATORY_CARE_PROVIDER_SITE_OTHER): Payer: Self-pay | Admitting: Vascular Surgery

## 2021-05-13 DIAGNOSIS — I714 Abdominal aortic aneurysm, without rupture, unspecified: Secondary | ICD-10-CM

## 2021-05-14 ENCOUNTER — Encounter (INDEPENDENT_AMBULATORY_CARE_PROVIDER_SITE_OTHER): Payer: Self-pay | Admitting: Vascular Surgery

## 2021-05-14 ENCOUNTER — Ambulatory Visit (INDEPENDENT_AMBULATORY_CARE_PROVIDER_SITE_OTHER): Payer: Medicare HMO | Admitting: Vascular Surgery

## 2021-05-14 ENCOUNTER — Other Ambulatory Visit (INDEPENDENT_AMBULATORY_CARE_PROVIDER_SITE_OTHER): Payer: Medicare HMO

## 2021-05-19 DIAGNOSIS — Z961 Presence of intraocular lens: Secondary | ICD-10-CM | POA: Diagnosis not present

## 2021-05-25 ENCOUNTER — Other Ambulatory Visit
Admission: RE | Admit: 2021-05-25 | Discharge: 2021-05-25 | Disposition: A | Discharge status: Home / Self Care | Payer: Medicare HMO | Attending: Urology | Admitting: Urology

## 2021-05-25 ENCOUNTER — Other Ambulatory Visit: Payer: Self-pay

## 2021-05-25 ENCOUNTER — Encounter: Payer: Self-pay | Admitting: Urology

## 2021-05-25 ENCOUNTER — Ambulatory Visit: Payer: Medicare HMO | Admitting: Urology

## 2021-05-25 VITALS — BP 131/81 | HR 56 | Ht 68.0 in | Wt 293.0 lb

## 2021-05-25 DIAGNOSIS — R7989 Other specified abnormal findings of blood chemistry: Secondary | ICD-10-CM | POA: Diagnosis not present

## 2021-05-25 NOTE — Progress Notes (Signed)
05/25/21 9:10 AM   Livia Snellen Villella 02-Oct-1951 [id number]  CC: Fatigue, low testosterone  HPI: Mr. Heitz is a 70 year old male with morbid obesity and BMI of 45, history of CAD status post CABG who remains on anticoagulation with Plavix and valve replacement who was recently found to have a low free testosterone of 4.9.  PSA was normal at 0.68, and hematocrit normal at 42.  His primary complaint is fatigue and decreased energy.  He denies significant problems with erections, and he is still sexually active with his wife.  They do not have intercourse primarily secondary to his weight issues, but he is still able to have erections and orgasms.     PMH: Past Medical History:  Diagnosis Date   Arthritis    knees   Complication of anesthesia    aggitation after knee surgery   CVA (cerebral vascular accident) ([place]) 02/26/2020   Family history of adverse reaction to anesthesia    brother - hallucinations   GERD (gastroesophageal reflux disease)    Hx of smoking 06/26/2015   Hyperlipidemia    Hypertension    Hypothyroidism    Kidney stone    Morbid obesity ([place])    Murmur    Prediabetes 10/14/2016   Sleep apnea    score of 5 on apnea screen    Surgical History: Past Surgical History:  Procedure Laterality Date   AORTIC VALVE REPLACEMENT  08/25/2020   CATARACT EXTRACTION W/PHACO Left 03/30/2021   Procedure: CATARACT EXTRACTION PHACO AND INTRAOCULAR LENS PLACEMENT ([place]) LEFT DIABETIC 5.27 00:28.8;  Surgeon: Birder Robson, MD;  Location: [place];  Service: Ophthalmology;  Laterality: Left;   CATARACT EXTRACTION W/PHACO Right 04/13/2021   Procedure: CATARACT EXTRACTION PHACO AND INTRAOCULAR LENS PLACEMENT ([place]) RIGHT DIABETIC;  Surgeon: Birder Robson, MD;  Location: [place];  Service: Ophthalmology;  Laterality: Right;  8.18 00:41.1   CORONARY ARTERY BYPASS GRAFT  08/25/2020   X2   ESOPHAGOGASTRODUODENOSCOPY (EGD) WITH PROPOFOL N/A 10/05/2016    Procedure: ESOPHAGOGASTRODUODENOSCOPY (EGD) WITH PROPOFOL;  Surgeon: Jonathon Bellows, MD;  Location: [place];  Service: Endoscopy;  Laterality: N/A;  sleep apnea   HERNIA REPAIR     JOINT REPLACEMENT     REPLACEMENT TOTAL KNEE Right 2017   TOTAL KNEE ARTHROPLASTY  1973    Family History: Family History  Problem Relation Age of Onset   Cancer Mother        leukemia   Diabetes Brother    Blindness Brother    Heart attack Maternal Grandfather    Prostate cancer Neg Hx    Bladder Cancer Neg Hx    Kidney cancer Neg Hx     Social History:  reports that he quit smoking about 5 years ago. His smoking use included cigarettes. He has a 5.00 pack-year smoking history. He has never used smokeless tobacco. He reports current alcohol use. He reports that he does not use drugs.  Physical Exam: BP 131/81   Pulse (!) 56   Ht 5\' 8"  (1.727 m)   Wt 293 lb (132.9 kg)   BMI 44.55 kg/m    Constitutional:  Alert and oriented, No acute distress. Cardiovascular: No clubbing, cyanosis, or edema. Respiratory: Normal respiratory effort, no increased work of breathing. GI: Abdomen is soft, nontender, nondistended, no abdominal masses   Pertinent Imaging: I have personally viewed and interpreted the CT stone protocol from July 2019 showing a 6 mm midpole nonobstructive right renal stone, no hydronephrosis  Assessment & Plan:  70 year old male with morbid obesity and BMI of 45 with decreased energy and a single low free testosterone value.  We reviewed the AUA guidelines regarding testosterone evaluation and management, and I recommended repeating a total testosterone today as well as an LH.  We discussed different options for low testosterone including behavioral strategies with weight loss and exercise, testosterone replacement therapy, and other alternatives like Clomid.  Total testosterone, LH today RTC with PA 2 weeks to discuss options  Nickolas Madrid, MD 05/25/2021  [facility]  [facility] [address] [place], [place] [address] [phone]

## 2021-05-25 NOTE — Patient Instructions (Signed)
[facility] (11th ed., pp. [phone]). [place], [place]: Elsevier.">  Testosterone Replacement Therapy Testosterone replacement therapy (TRT) treats men who have a low testosterone level. Testosterone is a male hormone that is produced in the testicles. It is responsible for typical male characteristics and for maintaining a man's sex drive and ability to get an erection. Testosterone also supports bone andmuscle health. Low testosterone may not need to be treated. Your health care provider may recommend TRT if you have symptoms such as a low sex drive or erectionproblems, weak muscles or bones, or low energy. Types of TRT  You and your health care provider will decide which form is best for you. TRTis available in the following forms: Topical gels, creams, lotions, or sprays. Do not let other people, especially women or children, come in contact with the skin where the testosterone was applied. Nasal gels. Patches. Pills. Injections into the muscle or under the skin. Long-acting pellets inserted under the skin. The amount of TRT you take and how long you take it is based on your condition. It is important to: Begin TRT with the lowest possible dosage. Stop TRT if your health care provider tells you to stop. Work with your health care provider so that you feel informed and comfortable with your decision. Tell a health care provider about: Any allergies you have. Any personal or family history of blood clots, breast cancer, or prostate cancer. If you have sleep apnea or have been told that you snore. All medicines you are taking, including vitamins, herbs, eye drops, creams, and over-the-counter medicines. Any surgeries you have had. Any other medical conditions you have. If you wish to have biological children someday. What are the benefits? Benefits of TRT vary but may include improved sexual function, muscle mass orstrength, mood, or quality of life. What are the risks? Risks  of TRT vary depending on your individual and medical history. Side effects can be related to the type of TRT you choose. If you choose products that are used on the skin, you may have skin irritation. If you get injections,you may have redness and swelling at the injection site. Side effects that can happen with any form of TRT include: Lower sperm count. Acne. Swelling of your legs or feet, or tenderness in the chest or breast area. Sleep disturbances and mood swings. Enlarged prostate. Increase in your red blood count. It is unclear if testosterone increases the risk of some serious conditions. Talk with your health care provider about your risk for these conditions, including: Blood clots. Heart disease, such as stroke or heart attack. Prostate cancer. Risks of TRT may increase if you: Have had or are at risk for prostate cancer or breast cancer. Have had a previous stroke or heart attack. Have a high number of red blood cells. Have treated or untreated sleep apnea. Have a large prostate. The long-term safety of TRT is not known. Follow these instructions at home: Take over-the-counter and prescription medicines only as told by your health care provider. Lose weight if you are overweight. Ask your health care provider to help you start a healthy diet and exercise program to reach and maintain a healthy weight. Work with your health care provider to manage other medical conditions that may lower your testosterone. These include obesity, high blood pressure, high cholesterol, diabetes, liver disease, kidney disease, and sleep apnea. Do not use any testosterone replacement therapies that are not prescribed by your health care provider. Keep all follow-up visits. This is important. Where to  find more information [place]: www.urologyhealth.org Endocrine Society: www.hormone.org Contact a health care provider if: You have side effects from your TRT. You have pain or  swelling in your legs. You have problems urinating. You have lumps or changes in your breasts or armpits. Get help right away if: You have shortness of breath. You have chest pain. You have slurred speech. You have weakness or numbness in any part of your arms or legs. These symptoms may represent a serious problem that is an emergency. Do not wait to see if the symptoms will go away. Get medical help right away. Call your local emergency services (911 in the [place] Do not drive yourself to the hospital. Summary Testosterone replacement therapy (TRT) is used to treat men who have a low testosterone level. TRT should only be prescribed by and under the supervision of a health care provider. Tell a health care provider about any medical conditions you have. TRT may have side effects. Talk with your health care provider about all of the risks and benefits before you start therapy. This information is not intended to replace advice given to you by your health care provider. Make sure you discuss any questions you have with your healthcare provider. Document Revised: 09/08/2020 Document Reviewed: 09/08/2020 Elsevier Patient Education  2022 [employer].

## 2021-05-26 LAB — TESTOSTERONE: Testosterone: 216 ng/dL — ABNORMAL LOW (ref 264–916)

## 2021-05-26 LAB — LUTEINIZING HORMONE: LH: 4.9 m[IU]/mL (ref 1.7–8.6)

## 2021-05-27 ENCOUNTER — Telehealth: Payer: Self-pay

## 2021-05-27 NOTE — Telephone Encounter (Signed)
-----   Message from Billey Co, MD sent at 05/26/2021  8:19 AM EDT ----- Testosterone was low at 216(normal more than 300), keep follow-up as scheduled with Larene Beach to discuss testosterone replacement options  Nickolas Madrid, MD 05/26/2021

## 2021-05-27 NOTE — Telephone Encounter (Signed)
See MyChart message

## 2021-06-04 ENCOUNTER — Other Ambulatory Visit: Payer: Self-pay

## 2021-06-04 ENCOUNTER — Ambulatory Visit (INDEPENDENT_AMBULATORY_CARE_PROVIDER_SITE_OTHER): Payer: Medicare HMO | Admitting: Vascular Surgery

## 2021-06-04 ENCOUNTER — Encounter (INDEPENDENT_AMBULATORY_CARE_PROVIDER_SITE_OTHER): Payer: Self-pay | Admitting: Vascular Surgery

## 2021-06-04 ENCOUNTER — Ambulatory Visit (INDEPENDENT_AMBULATORY_CARE_PROVIDER_SITE_OTHER): Payer: Medicare HMO

## 2021-06-04 VITALS — BP 139/81 | HR 91 | Resp 18 | Ht 67.0 in | Wt 289.0 lb

## 2021-06-04 DIAGNOSIS — E119 Type 2 diabetes mellitus without complications: Secondary | ICD-10-CM | POA: Diagnosis not present

## 2021-06-04 DIAGNOSIS — E782 Mixed hyperlipidemia: Secondary | ICD-10-CM

## 2021-06-04 DIAGNOSIS — I714 Abdominal aortic aneurysm, without rupture, unspecified: Secondary | ICD-10-CM

## 2021-06-04 DIAGNOSIS — I723 Aneurysm of iliac artery: Secondary | ICD-10-CM | POA: Diagnosis not present

## 2021-06-04 DIAGNOSIS — I10 Essential (primary) hypertension: Secondary | ICD-10-CM

## 2021-06-04 NOTE — Assessment & Plan Note (Signed)
His duplex today shows an aorta of just less than 3 cm with bilateral common iliac arteries measuring 1.6 cm in maximal diameter which is only slightly different than it was a year ago.  No role for intervention at this size.  Very low risk of rupture.  Continue to follow annually with duplex.

## 2021-06-04 NOTE — Progress Notes (Signed)
MRN : [id number]  Derrick Mckenzie is a 70 y.o. (1951/05/18) male who presents with chief complaint of  Chief Complaint  Patient presents with   Follow-up    ultrasound  .  History of Present Illness: Patient returns today in follow up of his iliac artery aneurysms.  He has had a very difficult year.  He lost a daughter several months ago unexpectedly to sepsis.  He has not had any aneurysm related symptoms. Specifically, the patient denies new back or abdominal pain, or signs of peripheral embolization.  His duplex today shows an aorta of just less than 3 cm with bilateral common iliac arteries measuring 1.6 cm in maximal diameter which is only slightly different than it was a year ago.  Current Outpatient Medications  Medication Sig Dispense Refill   Ascorbic Acid (VITAMIN C PO) Take by mouth.     aspirin EC 81 MG tablet Take 81 mg by mouth daily. Swallow whole.     atorvastatin (LIPITOR) 80 MG tablet      clopidogrel (PLAVIX) 75 MG tablet Take 1 tablet (75 mg total) by mouth daily. 30 tablet 0   ELDERBERRY PO Take by mouth daily.     furosemide (LASIX) 40 MG tablet Take 40 mg by mouth daily.      Krill Oil 1000 MG CAPS Take 2,000 mg by mouth 3 (three) times daily.     levothyroxine (SYNTHROID) 100 MCG tablet Take 100 mcg by mouth daily.     meloxicam (MOBIC) 7.5 MG tablet Take by mouth.     metFORMIN (GLUCOPHAGE-XR) 500 MG 24 hr tablet Take 500 mg by mouth in the morning and at bedtime.     omeprazole (PRILOSEC) 40 MG capsule Take 1 capsule (40 mg total) by mouth 2 (two) times daily. 180 capsule 1   vitamin B-12 (CYANOCOBALAMIN) 100 MCG tablet Take 100 mcg by mouth daily.     VITAMIN D PO Take by mouth.     traZODone (DESYREL) 50 MG tablet Take by mouth. (Patient not taking: Reported on 06/04/2021)     No current facility-administered medications for this visit.    Past Medical History:  Diagnosis Date   Arthritis    knees   Complication of anesthesia    aggitation after  knee surgery   CVA (cerebral vascular accident) ([place]) 02/26/2020   Family history of adverse reaction to anesthesia    brother - hallucinations   GERD (gastroesophageal reflux disease)    Hx of smoking 06/26/2015   Hyperlipidemia    Hypertension    Hypothyroidism    Kidney stone    Morbid obesity ([employer])    Murmur    Prediabetes 10/14/2016   Sleep apnea    score of 5 on apnea screen    Past Surgical History:  Procedure Laterality Date   AORTIC VALVE REPLACEMENT  08/25/2020   CATARACT EXTRACTION W/PHACO Left 03/30/2021   Procedure: CATARACT EXTRACTION PHACO AND INTRAOCULAR LENS PLACEMENT ([place]) LEFT DIABETIC 5.27 00:28.8;  Surgeon: Birder Robson, MD;  Location: [facility];  Service: Ophthalmology;  Laterality: Left;   CATARACT EXTRACTION W/PHACO Right 04/13/2021   Procedure: CATARACT EXTRACTION PHACO AND INTRAOCULAR LENS PLACEMENT ([place]) RIGHT DIABETIC;  Surgeon: Birder Robson, MD;  Location: [facility];  Service: Ophthalmology;  Laterality: Right;  8.18 00:41.1   CORONARY ARTERY BYPASS GRAFT  08/25/2020   X2   ESOPHAGOGASTRODUODENOSCOPY (EGD) WITH PROPOFOL N/A 10/05/2016   Procedure: ESOPHAGOGASTRODUODENOSCOPY (EGD) WITH PROPOFOL;  Surgeon: Jonathon Bellows, MD;  Location: [facility];  Service: Endoscopy;  Laterality: N/A;  sleep apnea   HERNIA REPAIR     JOINT REPLACEMENT     REPLACEMENT TOTAL KNEE Right 2017   TOTAL KNEE ARTHROPLASTY  1973     Social History   Tobacco Use   Smoking status: Former    Packs/day: 0.50    Years: 10.00    Pack years: 5.00    Types: Cigarettes    Quit date: 02/03/2016    Years since quitting: 5.3   Smokeless tobacco: Never   Tobacco comments:    Quit in 2017  Vaping Use   Vaping Use: Never used  Substance Use Topics   Alcohol use: Yes    Alcohol/week: 0.0 standard drinks    Comment: socially - Holidays   Drug use: No       Family History  Problem Relation Age of Onset   Cancer Mother        leukemia    Diabetes Brother    Blindness Brother    Heart attack Maternal Grandfather    Prostate cancer Neg Hx    Bladder Cancer Neg Hx    Kidney cancer Neg Hx      No Known Allergies      REVIEW OF SYSTEMS (Negative unless checked)   Constitutional: [] Weight loss  [] Fever  [] Chills Cardiac: [] Chest pain   [] Chest pressure   [] Palpitations   [] Shortness of breath when laying flat   [] Shortness of breath at rest   [] Shortness of breath with exertion. Vascular:  [] Pain in legs with walking   [] Pain in legs at rest   [] Pain in legs when laying flat   [] Claudication   [] Pain in feet when walking  [] Pain in feet at rest  [] Pain in feet when laying flat   [] History of DVT   [] Phlebitis   [] Swelling in legs   [] Varicose veins   [] Non-healing ulcers Pulmonary:   [] Uses home oxygen   [] Productive cough   [] Hemoptysis   [] Wheeze  [] COPD   [] Asthma Neurologic:  [] Dizziness  [] Blackouts   [] Seizures   [] History of stroke   [] History of TIA  [] Aphasia   [] Temporary blindness   [] Dysphagia   [] Weakness or numbness in arms   [] Weakness or numbness in legs Musculoskeletal:  [x] Arthritis   [] Joint swelling   [x] Joint pain   [] Low back pain Hematologic:  [] Easy bruising  [] Easy bleeding   [] Hypercoagulable state   [] Anemic  [] Hepatitis Gastrointestinal:  [] Blood in stool   [] Vomiting blood  [x] Gastroesophageal reflux/heartburn   [] Abdominal pain Genitourinary:  [] Chronic kidney disease   [] Difficult urination  [] Frequent urination  [] Burning with urination   [] Hematuria Skin:  [] Rashes   [] Ulcers   [] Wounds Psychological:  [] History of anxiety   []  History of major depression.  Physical Examination  BP 139/81 (BP Location: Right Arm)   Pulse 91   Resp 18   Ht 5\' 7"  (1.702 m)   Wt 289 lb (131.1 kg)   BMI 45.26 kg/m  Gen:  WD/WN, NAD Head: [place]/AT, No temporalis wasting. Ear/Nose/Throat: Hearing grossly intact, nares w/o erythema or drainage Eyes: Conjunctiva clear. Sclera non-icteric Neck: Supple.  Trachea  midline Pulmonary:  Good air movement, no use of accessory muscles.  Cardiac: RRR, no JVD Vascular:  Vessel Right Left  Radial Palpable Palpable  Musculoskeletal: M/S 5/5 throughout.  No deformity or atrophy.  Trace lower extremity edema. Neurologic: Sensation grossly intact in extremities.  Symmetrical.  Speech is fluent.  Psychiatric: Judgment intact, Mood & affect appropriate for pt's clinical situation. Dermatologic: No rashes or ulcers noted.  No cellulitis or open wounds.      Labs Recent Results (from the past 2160 hour(s))  Glucose, capillary     Status: Abnormal   Collection Time: 03/30/21  6:39 AM  Result Value Ref Range   Glucose-Capillary 127 (H) 70 - 99 mg/dL    Comment: Glucose reference range applies only to samples taken after fasting for at least 8 hours.  Glucose, capillary     Status: Abnormal   Collection Time: 03/30/21  7:50 AM  Result Value Ref Range   Glucose-Capillary 126 (H) 70 - 99 mg/dL    Comment: Glucose reference range applies only to samples taken after fasting for at least 8 hours.  Glucose, capillary     Status: Abnormal   Collection Time: 04/13/21  6:49 AM  Result Value Ref Range   Glucose-Capillary 132 (H) 70 - 99 mg/dL    Comment: Glucose reference range applies only to samples taken after fasting for at least 8 hours.  Glucose, capillary     Status: Abnormal   Collection Time: 04/13/21  8:12 AM  Result Value Ref Range   Glucose-Capillary 121 (H) 70 - 99 mg/dL    Comment: Glucose reference range applies only to samples taken after fasting for at least 8 hours.  Luteinizing hormone     Status: None   Collection Time: 05/25/21  9:44 AM  Result Value Ref Range   LH 4.9 1.7 - 8.6 mIU/mL    Comment: (NOTE) Performed At: [facility] [place], [place] [id number] Rush Farmer MD [id number]   Testosterone     Status: Abnormal   Collection Time: 05/25/21  9:44 AM  Result Value  Ref Range   Testosterone 216 (L) 264 - 916 ng/dL    Comment: (NOTE) Adult male reference interval is based on a population of healthy nonobese males (BMI <30) between 30 and 67 years old. [place], [place] [phone]. PMID: [id number]. Performed At: [facility] [place], [place] [id number] Rush Farmer MD SH:[id number]     Radiology VAS [place] AAA DUPLEX  Result Date: 06/04/2021 ABDOMINAL AORTA STUDY Patient Name:  Derrick Mckenzie  Date of Exam:   06/04/2021 Medical Rec #: [id number]          Accession #:    [id number] Date of Birth: Jan 10, 1951          Patient Gender: M Patient Age:   70Y Exam Location:  [facility] Procedure:      VAS [place] AAA DUPLEX Referring Phys: [id number] [place] --------------------------------------------------------------------------------  Indications: Follow up exam for known AAA. Limitations: Air/bowel gas and obesity.  Performing Technologist: Blondell Reveal RT, RDMS, RVT  Examination Guidelines: A complete evaluation includes B-mode imaging, spectral Doppler, color Doppler, and power Doppler as needed of all accessible portions of each vessel. Bilateral testing is considered an integral part of a complete examination. Limited examinations for reoccurring indications may be performed as noted.  Abdominal Aorta Findings: +-----------+-------+----------+----------+--------+--------+--------+ Location   AP (cm)Trans (cm)PSV (cm/s)WaveformThrombusComments +-----------+-------+----------+----------+--------+--------+--------+ Proximal   2.99   2.96      62        biphasic                 +-----------+-------+----------+----------+--------+--------+--------+  Mid        2.47   2.72      105       biphasic                 +-----------+-------+----------+----------+--------+--------+--------+ Distal     2.42   2.26      94        biphasic                  +-----------+-------+----------+----------+--------+--------+--------+ RT CIA Prox1.6    1.6       83        biphasic                 +-----------+-------+----------+----------+--------+--------+--------+ LT CIA Prox1.5    1.6       72        biphasic                 +-----------+-------+----------+----------+--------+--------+--------+  Summary: No focal dilatation of the abdominal aorta with possible bilateral CIA ectasia noted, based on limited visualization, as described above. Patent abdominal aorta and proximal/mid CIA with no evidence of stenosis. No significant change in the maximum CIA diameters when compared to the previous exam on 05/08/20.  *See table(s) above for measurements and observations.  Electronically signed by Leotis Pain MD on 06/04/2021 at 8:38:05 AM.   Final     Assessment/Plan Hypertension blood pressure control important in reducing the progression of atherosclerotic disease and aneurysmal growth. On appropriate oral medications.     Hyperlipidemia lipid control important in reducing the progression of atherosclerotic disease. Continue statin therapy   Type 2 diabetes mellitus without complication (HCC) blood glucose control important in reducing the progression of atherosclerotic disease. Also, involved in wound healing. On appropriate medications.  Iliac artery aneurysm (HCC) His duplex today shows an aorta of just less than 3 cm with bilateral common iliac arteries measuring 1.6 cm in maximal diameter which is only slightly different than it was a year ago.  No role for intervention at this size.  Very low risk of rupture.  Continue to follow annually with duplex.    Leotis Pain, MD  06/04/2021 9:20 AM    This note was created with Dragon medical transcription system.  Any errors from dictation are purely unintentional

## 2021-06-08 ENCOUNTER — Ambulatory Visit: Payer: Self-pay | Admitting: Urology

## 2021-06-08 NOTE — Progress Notes (Addendum)
06/09/2021 9:40 AM   Derrick Mckenzie Mar 26, 1951 [id number]  Referring provider: Sofie Hartigan, MD [place],  [place] [address]  Urological history: 1.  Nephrolithiasis -CT renal stone study 2019 - Nonobstructive 8 mm stone present within the interpolar right kidney. 3 mm nonobstructive stone present within the lower pole left kidney   2. ED -contributing factors of age, CVA, HLD, hypothyroidism, HTN, obesity, sleep apnea, diabetes, CAD and history of smoking -SHIM 16   Chief Complaint  Patient presents with   Hypogonadism    HPI: Derrick Mckenzie is a 70 y.o. male who a low testosterone who presents today for further discussion regarding his fatigue.   He is having fatigue, LBP and ED.  His first morning testosterone was 216 on  05/25/2021.  LH normal.   PSA 0.68 04/2021  He and his wife have difficulty having intercourse due to body habitus.  They are satisfied with foreplay at this time.    SHIM     Row Name 06/09/21 0859         SHIM: Over the last 6 months:   How do you rate your confidence that you could get and keep an erection? Moderate     When you had erections with sexual stimulation, how often were your erections hard enough for penetration (entering your partner)? A Few Times (much less than half the time)     During sexual intercourse, how often were you able to maintain your erection after you had penetrated (entered) your partner? A Few Times (much less than half the time)     During sexual intercourse, how difficult was it to maintain your erection to completion of intercourse? Slightly Difficult     When you attempted sexual intercourse, how often was it satisfactory for you? Almost Always or Always           SHIM Total Score     SHIM 16             Score: 1-7 Severe ED 8-11 Moderate ED 12-16 Mild-Moderate ED 17-21 Mild ED 22-25 No ED     PMH: Past Medical History:  Diagnosis Date   Arthritis    knees    Complication of anesthesia    aggitation after knee surgery   CVA (cerebral vascular accident) ([place]) 02/26/2020   Family history of adverse reaction to anesthesia    brother - hallucinations   GERD (gastroesophageal reflux disease)    Hx of smoking 06/26/2015   Hyperlipidemia    Hypertension    Hypothyroidism    Kidney stone    Morbid obesity ([place])    Murmur    Prediabetes 10/14/2016   Sleep apnea    score of 5 on apnea screen    Surgical History: Past Surgical History:  Procedure Laterality Date   AORTIC VALVE REPLACEMENT  08/25/2020   CATARACT EXTRACTION W/PHACO Left 03/30/2021   Procedure: CATARACT EXTRACTION PHACO AND INTRAOCULAR LENS PLACEMENT ([place]) LEFT DIABETIC 5.27 00:28.8;  Surgeon: Birder Robson, MD;  Location: [facility];  Service: Ophthalmology;  Laterality: Left;   CATARACT EXTRACTION W/PHACO Right 04/13/2021   Procedure: CATARACT EXTRACTION PHACO AND INTRAOCULAR LENS PLACEMENT ([place]) RIGHT DIABETIC;  Surgeon: Birder Robson, MD;  Location: [facility];  Service: Ophthalmology;  Laterality: Right;  8.18 00:41.1   CORONARY ARTERY BYPASS GRAFT  08/25/2020   X2   ESOPHAGOGASTRODUODENOSCOPY (EGD) WITH PROPOFOL N/A 10/05/2016   Procedure: ESOPHAGOGASTRODUODENOSCOPY (EGD) WITH PROPOFOL;  Surgeon: Jonathon Bellows,  MD;  Location: [facility];  Service: Endoscopy;  Laterality: N/A;  sleep apnea   HERNIA REPAIR     JOINT REPLACEMENT     REPLACEMENT TOTAL KNEE Right 2017   TOTAL KNEE ARTHROPLASTY  1973    Home Medications:  Allergies as of 06/09/2021   No Known Allergies      Medication List        Accurate as of June 09, 2021  9:40 AM. If you have any questions, ask your nurse or doctor.          aspirin EC 81 MG tablet Take 81 mg by mouth daily. Swallow whole.   atorvastatin 80 MG tablet Commonly known as: LIPITOR   clopidogrel 75 MG tablet Commonly known as: PLAVIX Take 1 tablet (75 mg total) by mouth daily.   ELDERBERRY PO Take  by mouth daily.   furosemide 40 MG tablet Commonly known as: LASIX Take 40 mg by mouth daily.   Krill Oil 1000 MG Caps Take 2,000 mg by mouth 3 (three) times daily.   levothyroxine 100 MCG tablet Commonly known as: SYNTHROID Take 100 mcg by mouth daily.   meloxicam 7.5 MG tablet Commonly known as: MOBIC Take by mouth.   metFORMIN 500 MG 24 hr tablet Commonly known as: GLUCOPHAGE-XR Take 500 mg by mouth in the morning and at bedtime.   omeprazole 40 MG capsule Commonly known as: PRILOSEC Take 1 capsule (40 mg total) by mouth 2 (two) times daily.   traZODone 50 MG tablet Commonly known as: DESYREL Take by mouth.   vitamin B-12 100 MCG tablet Commonly known as: CYANOCOBALAMIN Take 100 mcg by mouth daily.   VITAMIN C PO Take by mouth.   VITAMIN D PO Take by mouth.        Allergies: No Known Allergies  Family History: Family History  Problem Relation Age of Onset   Cancer Mother        leukemia   Diabetes Brother    Blindness Brother    Heart attack Maternal Grandfather    Prostate cancer Neg Hx    Bladder Cancer Neg Hx    Kidney cancer Neg Hx     Social History:  reports that he quit smoking about 5 years ago. His smoking use included cigarettes. He has a 5.00 pack-year smoking history. He has never used smokeless tobacco. He reports current alcohol use. He reports that he does not use drugs.  ROS: Pertinent ROS in HPI  Physical Exam: BP (!) 167/95   Pulse 86   Ht _0  (1.753 m)   Wt 284 lb (128.8 kg)   BMI 41.94 kg/m   Constitutional:  Well nourished. Alert and oriented, No acute distress. HEENT: [place] AT, mask in place.  Trachea midline Cardiovascular: No clubbing, cyanosis, or edema. Respiratory: Normal respiratory effort, no increased work of breathing. Neurologic: Grossly intact, no focal deficits, moving all 4 extremities. Psychiatric: Normal mood and affect.  Laboratory Data: Lab Results  Component Value Date   WBC 7.8 02/16/2021   HGB  12.0 (L) 02/16/2021   HCT 35.7 (L) 02/16/2021   MCV 89 02/16/2021   PLT 240 02/16/2021   Lab Results  Component Value Date   TESTOSTERONE 216 (L) 05/25/2021    Lab Results  Component Value Date   HGBA1C 6.2 (H) 02/27/2020       Component Value Date/Time   CHOL 137 02/27/2020 0500   CHOL 209 (H) 04/12/2016 1137   HDL 28 (L) 02/27/2020 0500  HDL 33 (L) 04/12/2016 1137   CHOLHDL 4.9 02/27/2020 0500   VLDL 47 (H) 02/27/2020 0500   LDLCALC 62 02/27/2020 0500   LDLCALC 101 (H) 04/12/2016 1137   PSA (Prostate Specific Antigen), Total 0.10 - 4.00 ng/mL 0.68   [facility] - LAB   Narrative Performed by [facility] - LAB Test results were determined with Beckman Coulter Hybritech Assay. Values obtained with different assay methods cannot be used interchangeably in serial testing. Assay results should not be interpreted as absolute evidence of thepresence or absence of malignant disease Specimen Collected: 05/07/21 11:01 Last Resulted: 05/07/21 17:27  Received From: [facility]  Result Received: 05/10/21 17:19   WBC (White Blood Cell Count) 4.1 - 10.2 10^3/uL 7.9   RBC (Red Blood Cell Count) 4.69 - 6.13 10^6/uL 4.81   Hemoglobin 14.1 - 18.1 gm/dL 13.8 Low    Hematocrit 40.0 - 52.0 % 42.0   MCV (Mean Corpuscular Volume) 80.0 - 100.0 fl 87.3   MCH (Mean Corpuscular Hemoglobin) 27.0 - 31.2 pg 28.7   MCHC (Mean Corpuscular Hemoglobin Concentration) 32.0 - 36.0 gm/dL 32.9   Platelet Count 150 - 450 10^3/uL 200   RDW-CV (Red Cell Distribution Width) 11.6 - 14.8 % 13.4   MPV (Mean Platelet Volume) 9.4 - 12.4 fl 8.5 Low    Neutrophils 1.50 - 7.80 10^3/uL 5.40   Lymphocytes 1.00 - 3.60 10^3/uL 1.40   Mixed Count 0.10 - 0.90 10^3/uL 1.10 High    Neutrophil % 32.0 - 70.0 % 68.5   Lymphocyte % 10.0 - 50.0 % 18.1   Mixed % 3.0 - 14.4 % 13.4   Resulting Agency  [facility] - LAB  Specimen Collected: 05/07/21 11:01 Last  Resulted: 05/07/21 11:09  Received From: [facility]  Result Received: 05/10/21 17:19   Thyroid Stimulating Hormone (TSH) 0.450-5.330 uIU/ml uIU/mL 5.827 High    Resulting Agency  [place] - LAB  Specimen Collected: 05/07/21 11:01 Last Resulted: 05/07/21 17:27  Received From: [facility]  Result Received: 05/10/21 17:19   Hemoglobin A1C 4.2 - 5.6 % 5.9 High    Average Blood Glucose (Calc) mg/dL 123   Resulting Agency  [facility] - LAB   Narrative Performed by [facility] - LAB Normal Range:    4.2 - 5.6%  Increased Risk:  5.7 - 6.4%  Diabetes:        >= 6.5%  Glycemic Control for adults with diabetes:  <7%   Specimen Collected: 05/07/21 11:01 Last Resulted: 05/07/21 16:40  Received From: [facility]  Result Received: 05/10/21 17:19   Cholesterol, Total 100 - 200 mg/dL 112   Triglyceride 35 - 199 mg/dL 175   HDL (High Density Lipoprotein) Cholesterol 29.0 - 71.0 mg/dL 30.1   LDL Calculated 0 - 130 mg/dL 47   VLDL Cholesterol mg/dL 35   Cholesterol/HDL Ratio  3.7   Resulting Agency  [facility] - LAB  Specimen Collected: 05/07/21 11:01 Last Resulted: 05/07/21 17:41  Received From: [place]  Result Received: 05/10/21 17:19   Glucose 70 - 110 mg/dL 112 High    Sodium 136 - 145 mmol/L 142   Potassium 3.6 - 5.1 mmol/L 3.8   Chloride 97 - 109 mmol/L 102   Carbon Dioxide (CO2) 22.0 - 32.0 mmol/L 31.2   Urea Nitrogen (BUN) 7 - 25 mg/dL 23   Creatinine 0.7 - 1.3 mg/dL 1.3  Glomerular Filtration Rate (eGFR), MDRD Estimate >60 mL/min/1.73sq m 54 Low    Calcium 8.7 - 10.3 mg/dL 10.0   AST  8 - 39 U/L 18   ALT  6 - 57 U/L 12   Alk Phos (alkaline Phosphatase) 34 - 104 U/L 75   Albumin 3.5 - 4.8 g/dL 4.4   Bilirubin, Total 0.3 - 1.2 mg/dL 0.8   Protein, Total 6.1 - 7.9 g/dL 7.1   A/G Ratio 1.0 - 5.0 gm/dL 1.6   Resulting Agency  [facility] - LAB  Specimen  Collected: 05/07/21 11:01 Last Resulted: 05/07/21 17:41  Received From: [place]  Result Received: 05/10/21 17:19  I have reviewed the labs.   Pertinent Imaging: N/A  Assessment & Plan:    1. Low testosterone - I explained to patient that the current guidelines from the [place] reports the diagnosis of hypogonadism requires a morning serum total testosterone level at least 2 days apart that is below 300 ng/dL - most insurances require the blood work to be done before 9 am - second testosterone pending - At this time, the patient does not meet this requirement.  He will return for two morning serum testosterones, two days apart before 10 AM  - If the testosterones levels return below 300 ng/dL we will need to do additional blood work [facility] - if that returns below normal or low normal we will need to add a prolactin) - if that blood work is abnormal - we will need to refer to endocrinology - Advised the patient that low testosterone levels is a risk factor for CVD-followed by cardiologist  - I discussed with the patient the side effects of testosterone therapy, such as: enlargement of the prostate gland that may in turn cause LUTS, possible increased risk of PCa, DVT's and/or PE's, possible increased risk of heart attack or stroke, lower sperm count, swelling of the ankles, feet, or body, with or without heart failure, enlarged or painful breasts, have problems breathing while you sleep (sleep apnea), increased prostate specific antigen, mood swings, hypertension and  increased red blood cell count - we will need to check HCT/HBG levels prior to therapy and PSA if the patient is over 40 -he would like to be considered for Testopel   Return for Testopel PA .  These notes generated with voice recognition software. I apologize for typographical errors.  Zara Council, PA-C  [facility] [address]  [place], [place] [address] [phone]

## 2021-06-09 ENCOUNTER — Other Ambulatory Visit: Payer: Self-pay

## 2021-06-09 ENCOUNTER — Encounter: Payer: Self-pay | Admitting: Urology

## 2021-06-09 ENCOUNTER — Ambulatory Visit: Payer: Medicare HMO | Admitting: Urology

## 2021-06-09 VITALS — BP 167/95 | HR 86 | Ht 69.0 in | Wt 284.0 lb

## 2021-06-09 DIAGNOSIS — R7989 Other specified abnormal findings of blood chemistry: Secondary | ICD-10-CM | POA: Diagnosis not present

## 2021-06-10 LAB — TESTOSTERONE: Testosterone: 260 ng/dL — ABNORMAL LOW (ref 264–916)

## 2021-06-16 DIAGNOSIS — B351 Tinea unguium: Secondary | ICD-10-CM | POA: Diagnosis not present

## 2021-06-16 DIAGNOSIS — M79675 Pain in left toe(s): Secondary | ICD-10-CM | POA: Diagnosis not present

## 2021-06-16 DIAGNOSIS — E119 Type 2 diabetes mellitus without complications: Secondary | ICD-10-CM | POA: Diagnosis not present

## 2021-06-16 DIAGNOSIS — M79674 Pain in right toe(s): Secondary | ICD-10-CM | POA: Diagnosis not present

## 2021-06-18 DIAGNOSIS — E113213 Type 2 diabetes mellitus with mild nonproliferative diabetic retinopathy with macular edema, bilateral: Secondary | ICD-10-CM | POA: Diagnosis not present

## 2021-06-21 ENCOUNTER — Other Ambulatory Visit (HOSPITAL_COMMUNITY): Payer: Self-pay | Admitting: Physical Medicine & Rehabilitation

## 2021-06-21 ENCOUNTER — Other Ambulatory Visit: Payer: Self-pay | Admitting: Physical Medicine & Rehabilitation

## 2021-06-21 DIAGNOSIS — M5441 Lumbago with sciatica, right side: Secondary | ICD-10-CM

## 2021-06-21 DIAGNOSIS — M5442 Lumbago with sciatica, left side: Secondary | ICD-10-CM

## 2021-06-21 DIAGNOSIS — G8929 Other chronic pain: Secondary | ICD-10-CM | POA: Diagnosis not present

## 2021-06-28 ENCOUNTER — Other Ambulatory Visit: Payer: Self-pay

## 2021-06-28 ENCOUNTER — Ambulatory Visit
Admission: RE | Admit: 2021-06-28 | Discharge: 2021-06-28 | Disposition: A | Discharge status: Home / Self Care | Payer: Medicare HMO | Source: Ambulatory Visit | Attending: Physical Medicine & Rehabilitation | Admitting: Physical Medicine & Rehabilitation

## 2021-06-28 DIAGNOSIS — M545 Low back pain, unspecified: Secondary | ICD-10-CM | POA: Diagnosis not present

## 2021-06-28 DIAGNOSIS — M5442 Lumbago with sciatica, left side: Secondary | ICD-10-CM | POA: Insufficient documentation

## 2021-06-28 DIAGNOSIS — M5441 Lumbago with sciatica, right side: Secondary | ICD-10-CM | POA: Insufficient documentation

## 2021-07-09 DIAGNOSIS — M48062 Spinal stenosis, lumbar region with neurogenic claudication: Secondary | ICD-10-CM | POA: Diagnosis not present

## 2021-07-09 DIAGNOSIS — G8929 Other chronic pain: Secondary | ICD-10-CM | POA: Diagnosis not present

## 2021-07-09 DIAGNOSIS — M5441 Lumbago with sciatica, right side: Secondary | ICD-10-CM | POA: Diagnosis not present

## 2021-07-09 DIAGNOSIS — M5442 Lumbago with sciatica, left side: Secondary | ICD-10-CM | POA: Diagnosis not present

## 2021-07-19 DIAGNOSIS — M48062 Spinal stenosis, lumbar region with neurogenic claudication: Secondary | ICD-10-CM | POA: Diagnosis not present

## 2021-07-19 DIAGNOSIS — E119 Type 2 diabetes mellitus without complications: Secondary | ICD-10-CM | POA: Diagnosis not present

## 2021-07-22 DIAGNOSIS — E113213 Type 2 diabetes mellitus with mild nonproliferative diabetic retinopathy with macular edema, bilateral: Secondary | ICD-10-CM | POA: Diagnosis not present

## 2021-08-05 DIAGNOSIS — G8929 Other chronic pain: Secondary | ICD-10-CM | POA: Diagnosis not present

## 2021-08-05 DIAGNOSIS — M5441 Lumbago with sciatica, right side: Secondary | ICD-10-CM | POA: Diagnosis not present

## 2021-08-05 DIAGNOSIS — M48062 Spinal stenosis, lumbar region with neurogenic claudication: Secondary | ICD-10-CM | POA: Diagnosis not present

## 2021-08-05 DIAGNOSIS — M25551 Pain in right hip: Secondary | ICD-10-CM | POA: Diagnosis not present

## 2021-08-05 DIAGNOSIS — R2689 Other abnormalities of gait and mobility: Secondary | ICD-10-CM | POA: Diagnosis not present

## 2021-08-05 DIAGNOSIS — M5442 Lumbago with sciatica, left side: Secondary | ICD-10-CM | POA: Diagnosis not present

## 2021-08-06 DIAGNOSIS — M7071 Other bursitis of hip, right hip: Secondary | ICD-10-CM | POA: Diagnosis not present

## 2021-08-20 DIAGNOSIS — M5442 Lumbago with sciatica, left side: Secondary | ICD-10-CM | POA: Diagnosis not present

## 2021-08-20 DIAGNOSIS — M48062 Spinal stenosis, lumbar region with neurogenic claudication: Secondary | ICD-10-CM | POA: Diagnosis not present

## 2021-08-20 DIAGNOSIS — G8929 Other chronic pain: Secondary | ICD-10-CM | POA: Diagnosis not present

## 2021-08-20 DIAGNOSIS — M7071 Other bursitis of hip, right hip: Secondary | ICD-10-CM | POA: Diagnosis not present

## 2021-08-20 DIAGNOSIS — M5441 Lumbago with sciatica, right side: Secondary | ICD-10-CM | POA: Diagnosis not present

## 2021-08-20 DIAGNOSIS — M25551 Pain in right hip: Secondary | ICD-10-CM | POA: Diagnosis not present

## 2021-08-26 DIAGNOSIS — M48062 Spinal stenosis, lumbar region with neurogenic claudication: Secondary | ICD-10-CM | POA: Diagnosis not present

## 2021-08-26 DIAGNOSIS — E119 Type 2 diabetes mellitus without complications: Secondary | ICD-10-CM | POA: Diagnosis not present

## 2021-09-09 DIAGNOSIS — M5441 Lumbago with sciatica, right side: Secondary | ICD-10-CM | POA: Diagnosis not present

## 2021-09-09 DIAGNOSIS — M7071 Other bursitis of hip, right hip: Secondary | ICD-10-CM | POA: Diagnosis not present

## 2021-09-09 DIAGNOSIS — M48062 Spinal stenosis, lumbar region with neurogenic claudication: Secondary | ICD-10-CM | POA: Diagnosis not present

## 2021-09-09 DIAGNOSIS — M5442 Lumbago with sciatica, left side: Secondary | ICD-10-CM | POA: Diagnosis not present

## 2021-09-09 DIAGNOSIS — G8929 Other chronic pain: Secondary | ICD-10-CM | POA: Diagnosis not present

## 2021-09-15 DIAGNOSIS — M7071 Other bursitis of hip, right hip: Secondary | ICD-10-CM | POA: Diagnosis not present

## 2021-09-15 DIAGNOSIS — M6281 Muscle weakness (generalized): Secondary | ICD-10-CM | POA: Diagnosis not present

## 2021-09-15 DIAGNOSIS — M48062 Spinal stenosis, lumbar region with neurogenic claudication: Secondary | ICD-10-CM | POA: Diagnosis not present

## 2021-09-22 DIAGNOSIS — E113211 Type 2 diabetes mellitus with mild nonproliferative diabetic retinopathy with macular edema, right eye: Secondary | ICD-10-CM | POA: Diagnosis not present

## 2021-10-09 DIAGNOSIS — U071 COVID-19: Secondary | ICD-10-CM | POA: Diagnosis not present

## 2021-10-09 DIAGNOSIS — Z20822 Contact with and (suspected) exposure to covid-19: Secondary | ICD-10-CM | POA: Diagnosis not present

## 2021-10-09 NOTE — Progress Notes (Signed)
Formatting of this note is different from the original.    NAME:  David Dickerson   AGE:  9yrs  SEX:  Male  MR#:  [id number]  DATE OF VISIT:  10/09/2021    David Dickerson is a 70 y.o., Male who presents with the below chief complaint:    Chief Complaint   Patient presents with    Covid-19 Testing Request     Onset x1 week- cough- son tested positive for Covid      HISTORY OF PRESENT ILLNESS:    Patient presents for COVID-19 screening.    I have reviewed what nurse has recorded under chief complaint above and I have also reviewed the Covid Questionnaire which addresses symptoms/ROS and Covid exposure history.    _________________________________________    REVIEW OF SYSTEMS:  I reviewed COVID-19 Questionnaire which included pertinent review of systems.    PHYSICAL EXAM:  VS:  Pulse 56   Temp 97.9 F (36.6 C) (Oral)   Resp 18   Ht 5\' 9"  (1.753 m)   Wt 280 lb (127 kg)   SpO2 97%   BMI 41.35 kg/m   General: No acute distress, nontoxic  Head: Normocephalic, atraumatic  HEENT: hearing grossly normal, no drooling, controlling secretions well  Neck: supple  Pulmonary: Normal respiratory effort, no labored breathing, no visible shortness of breath  Neuro: alert and oriented; GCS 15  Psych: normal judgement, insight, mood, affect    ASSESSMENT AND PLAN:  Diagnoses and all orders for this visit:    Person under investigation for COVID-19  -     COVID-19/FLU/RSV PCR PANEL - POC; Future    Other orders  -     aspirin 81 mg Oral Tablet, Delayed Release (E.C.); Take 81 mg by mouth daily.  -     atorvastatin (LIPITOR) 80 mg Oral Tablet; Take 80 mg by mouth daily.  -     clopidogreL (PLAVIX) 75 mg Oral Tablet; Take 1 Tablet by mouth daily.  -     cyanocobalamin (VITAMIN B-12) 1,000 mcg Oral Tablet; Take 1,000 mcg by mouth daily.  -     furosemide (LASIX) 40 mg Oral Tablet; Take 40 mg by mouth twice a day.  -     levothyroxine (SYNTHROID, LEVOXYL) 100 mcg Oral Tablet; Take 100 mcg by mouth daily.  -     metFORMIN XR  (GLUCOPHAGE-XR) 500 mg Oral Tablet Sustained Release 24HR; TAKE 1 TABLET DAILY WITH DINNER  -     omeprazole (PriLOSEC) 40 mg Oral Capsule, Delayed Release(E.C.)  -     traZODone (DESYREL) 50 mg Oral Tablet; Take 50 mg by mouth daily as needed.  -     ergocalciferol, vitamin D2, (VITAMIN D ORAL); Take  by mouth.  -     KRILL OIL ORAL; Take  by mouth.  -     elderberry fruit (ELDERBERRY ORAL); Take  by mouth daily.  -     ascorbic acid (VITAMIN C ORAL); Take  by mouth.    MDM:  During the visit we documented the temperature and reviewed the following patient information: medical history, allergies, current medications, and problem list.  COVID-19 screening has been ordered.  I will interpret the test once results return from the laboratory and patient will be notified.    Encouraged increase in fluids, treat fever/myalgias with Tylenol/Ibuprofen if needed.  If coughing, use OTC cough syrup such as Delsym.  Rest.  Go to ER for change or worsening of symptoms and specifically shortness  of breath and chest pain  Follow CDC guidelines as per handouts given.  Your Rapid RT-PCR test will be back today. We will call you, or you can view your results on MyChart.  If symptomatic, increase fluids. Tylenol/ibuprofen as needed for fever/pain.  Please see attached [facility] documents for further instructions regarding quarantine/isolation, symptom management, and follow up care.  Avoid touching your face.  Wash hands often.  Avoid sharing household items.  Cover coughs and sneezes.  Go to Emergency Dept immediately or call 911 for severe shortness of breath/chest pain/extreme lethargy.    Results for orders placed or performed in visit on 10/09/21 (from the past 24 hour(s))   COVID-19/FLU/RSV PCR PANEL - POC    Collection Time: 10/09/21  3:10 PM    Specimen: Nasopharynx; Swab   Test Value Low-High    Respiratory syncytial virus Negative Negative    Influenza A by PCR Negative Negative    Influenza B by PCR Negative Negative     SARS-Coronavirus-2 PCR Positive (A) Negative    Narrative    The Xpert Xpress SARS-CoV-2/Flu/RSV test is a rapid, multiplexed real-time RT-PCR test indicated for the simultaneous qualitative detection and differentiation of SARS-CoV-2, influenza A, influenza B, and respiratory syncitial virus (RSV) viral RNA.     This test is only for use for qualified labs under the Food and Drug Administration's Emergency Use Authorization.     Positive results are indicative of the presence of the identified virus; clinical correlation with patient history and other diagnostic information is necessary to determine patient infection status. Positive results do not rule out  co-infection with other organisms; the organism(s) detected by this test may not be the definitive cause of disease.      Pt outside window of treatment for anti-virals.       Electronically signed by Burnard Bunting, PA-C at 10/09/2021  4:20 PM EST

## 2021-10-09 NOTE — Progress Notes (Signed)
Formatting of this note might be different from the original.  Covid swab collected by this nurse in full PPE. PUI documents provided reviewed for home quarantine (if applicable). Pt verbalizes understanding and agrees to home isolation.   Electronically signed by Atilano Median, RN at 10/09/2021  3:24 PM EST

## 2021-10-09 NOTE — Progress Notes (Signed)
Formatting of this note might be different from the original.                 Novel Corona Virus Screening Questions    Symptoms reported: COUGH    2.   If symptomatic, date of symptom onset?     3.   Have you been around someone who has tested positive for coronavirus? Yes    4.   Have you ever tested positive for covid? No    5.   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   Are you an [employer] employee? No    7.   Have you received a Covid vaccine(s)? NO VACCINE   If yes, Dates:     8.   Have you received a Flu vaccine this season? No      Electronically signed by Atilano Median, RN at 10/09/2021  3:05 PM EST

## 2021-10-25 DIAGNOSIS — M48062 Spinal stenosis, lumbar region with neurogenic claudication: Secondary | ICD-10-CM | POA: Diagnosis not present

## 2021-10-25 DIAGNOSIS — E113213 Type 2 diabetes mellitus with mild nonproliferative diabetic retinopathy with macular edema, bilateral: Secondary | ICD-10-CM | POA: Diagnosis not present

## 2021-10-25 DIAGNOSIS — E113211 Type 2 diabetes mellitus with mild nonproliferative diabetic retinopathy with macular edema, right eye: Secondary | ICD-10-CM | POA: Diagnosis not present

## 2021-10-25 DIAGNOSIS — M6281 Muscle weakness (generalized): Secondary | ICD-10-CM | POA: Diagnosis not present

## 2021-10-25 DIAGNOSIS — M7071 Other bursitis of hip, right hip: Secondary | ICD-10-CM | POA: Diagnosis not present

## 2021-10-27 DIAGNOSIS — M7071 Other bursitis of hip, right hip: Secondary | ICD-10-CM | POA: Diagnosis not present

## 2021-10-27 DIAGNOSIS — M48062 Spinal stenosis, lumbar region with neurogenic claudication: Secondary | ICD-10-CM | POA: Diagnosis not present

## 2021-10-27 DIAGNOSIS — M6281 Muscle weakness (generalized): Secondary | ICD-10-CM | POA: Diagnosis not present

## 2021-11-01 DIAGNOSIS — M7071 Other bursitis of hip, right hip: Secondary | ICD-10-CM | POA: Diagnosis not present

## 2021-11-01 DIAGNOSIS — M6281 Muscle weakness (generalized): Secondary | ICD-10-CM | POA: Diagnosis not present

## 2021-11-01 DIAGNOSIS — M48062 Spinal stenosis, lumbar region with neurogenic claudication: Secondary | ICD-10-CM | POA: Diagnosis not present

## 2021-11-05 DIAGNOSIS — M7071 Other bursitis of hip, right hip: Secondary | ICD-10-CM | POA: Diagnosis not present

## 2021-11-05 DIAGNOSIS — M48062 Spinal stenosis, lumbar region with neurogenic claudication: Secondary | ICD-10-CM | POA: Diagnosis not present

## 2021-11-05 DIAGNOSIS — M6281 Muscle weakness (generalized): Secondary | ICD-10-CM | POA: Diagnosis not present

## 2021-11-10 DIAGNOSIS — E038 Other specified hypothyroidism: Secondary | ICD-10-CM | POA: Diagnosis not present

## 2021-11-10 DIAGNOSIS — E039 Hypothyroidism, unspecified: Secondary | ICD-10-CM | POA: Diagnosis not present

## 2021-11-10 DIAGNOSIS — K432 Incisional hernia without obstruction or gangrene: Secondary | ICD-10-CM | POA: Diagnosis not present

## 2021-11-10 DIAGNOSIS — I2583 Coronary atherosclerosis due to lipid rich plaque: Secondary | ICD-10-CM | POA: Diagnosis not present

## 2021-11-10 DIAGNOSIS — I10 Essential (primary) hypertension: Secondary | ICD-10-CM | POA: Diagnosis not present

## 2021-11-10 DIAGNOSIS — E538 Deficiency of other specified B group vitamins: Secondary | ICD-10-CM | POA: Diagnosis not present

## 2021-11-10 DIAGNOSIS — E782 Mixed hyperlipidemia: Secondary | ICD-10-CM | POA: Diagnosis not present

## 2021-11-10 DIAGNOSIS — E119 Type 2 diabetes mellitus without complications: Secondary | ICD-10-CM | POA: Diagnosis not present

## 2021-11-10 DIAGNOSIS — I2510 Atherosclerotic heart disease of native coronary artery without angina pectoris: Secondary | ICD-10-CM | POA: Diagnosis not present

## 2021-11-10 DIAGNOSIS — M5442 Lumbago with sciatica, left side: Secondary | ICD-10-CM | POA: Diagnosis not present

## 2021-11-10 DIAGNOSIS — I5032 Chronic diastolic (congestive) heart failure: Secondary | ICD-10-CM | POA: Diagnosis not present

## 2021-11-10 DIAGNOSIS — I110 Hypertensive heart disease with heart failure: Secondary | ICD-10-CM | POA: Diagnosis not present

## 2021-11-11 DIAGNOSIS — M6281 Muscle weakness (generalized): Secondary | ICD-10-CM | POA: Diagnosis not present

## 2021-11-11 DIAGNOSIS — M48062 Spinal stenosis, lumbar region with neurogenic claudication: Secondary | ICD-10-CM | POA: Diagnosis not present

## 2021-11-11 DIAGNOSIS — M7071 Other bursitis of hip, right hip: Secondary | ICD-10-CM | POA: Diagnosis not present

## 2021-11-24 DIAGNOSIS — E113213 Type 2 diabetes mellitus with mild nonproliferative diabetic retinopathy with macular edema, bilateral: Secondary | ICD-10-CM | POA: Diagnosis not present

## 2022-01-05 DIAGNOSIS — L720 Epidermal cyst: Secondary | ICD-10-CM | POA: Diagnosis not present

## 2022-01-12 DIAGNOSIS — E113211 Type 2 diabetes mellitus with mild nonproliferative diabetic retinopathy with macular edema, right eye: Secondary | ICD-10-CM | POA: Diagnosis not present

## 2022-03-03 DIAGNOSIS — E113211 Type 2 diabetes mellitus with mild nonproliferative diabetic retinopathy with macular edema, right eye: Secondary | ICD-10-CM | POA: Diagnosis not present

## 2022-03-28 ENCOUNTER — Encounter (INDEPENDENT_AMBULATORY_CARE_PROVIDER_SITE_OTHER): Payer: Self-pay | Admitting: *Deleted

## 2022-04-25 ENCOUNTER — Encounter (INDEPENDENT_AMBULATORY_CARE_PROVIDER_SITE_OTHER): Payer: Self-pay | Admitting: Vascular Surgery

## 2022-04-28 DIAGNOSIS — E113211 Type 2 diabetes mellitus with mild nonproliferative diabetic retinopathy with macular edema, right eye: Secondary | ICD-10-CM | POA: Diagnosis not present

## 2022-06-03 ENCOUNTER — Other Ambulatory Visit (INDEPENDENT_AMBULATORY_CARE_PROVIDER_SITE_OTHER): Payer: Medicare HMO

## 2022-06-03 ENCOUNTER — Ambulatory Visit (INDEPENDENT_AMBULATORY_CARE_PROVIDER_SITE_OTHER): Payer: Medicare HMO | Admitting: Vascular Surgery

## 2022-06-07 ENCOUNTER — Ambulatory Visit (INDEPENDENT_AMBULATORY_CARE_PROVIDER_SITE_OTHER): Payer: Medicare HMO | Admitting: Vascular Surgery

## 2022-06-07 ENCOUNTER — Other Ambulatory Visit (INDEPENDENT_AMBULATORY_CARE_PROVIDER_SITE_OTHER): Payer: Medicare HMO

## 2022-09-26 ENCOUNTER — Encounter (INDEPENDENT_AMBULATORY_CARE_PROVIDER_SITE_OTHER): Payer: Self-pay

## 2022-11-08 LAB — HEMOGLOBIN A1C
Estimated Avg Glucose, External: 129 mg/dL — ABNORMAL HIGH (ref 91–123)
Hemoglobin A1C, External: 6.1 % — ABNORMAL HIGH (ref 4.8–5.6)

## 2023-01-25 NOTE — Progress Notes (Signed)
Formatting of this note is different from the original.  Assessment:    72 year old Caucasian man, morbidly obese, with incidentally noted small aortoiliac aneurysms which are slowly growing.  Thankfully these are asymptomatic.    Plan::    Discussed at length.  Discussed indications for repair of AAA at 5.5 cm and common iliac aneurysm at closer to 3 cm.  He understands and is willing to continue surveillance imaging in our office.  I have taken the liberty of scheduling him in 1 year with [facility].  He will call if questions arise sooner.    Subjective:    Chief Complaint   Patient presents with    NEW PATIENT     AAA    RESULTS     Aorta aneurysm duplex     HPI      Review and summary of old records completed  01/25/2023 and reveals a very pleasant 72 year old Caucasian man who was found to have a 2.5 cm ectatic aorta and 1.5 cm left common iliac artery aneurysm about 5 or 6 years ago on screening ultrasound.  This has been followed and the most recent study reveals AAA to be 3.9 cm and left common iliac to be 2.3 cm.  He is asymptomatic and is in the office today to establish vascular care.    David Dickerson is a 72 y.o. male who presents today at the request of Julio Alm for vascular consultation regarding the small aortoiliac aneurysms.  He has a history of aortic valve replacement and coronary bypass done at [facility] a few years ago and feels great since.    Review of Systems: Filled out by patient and data entered by clinical staff Cheryl Flash, MD  01/25/2023  Constitutional: Negative for fever, chills or weight loss   Skin: Negative for rash or itching   HENT: Negative for headaches, hearing loss or nosebleeds   Cardiovascular Negative for chest pain, palpitations, claudication or leg swelling   Respiratory: Negative for cough, hemoptysis, shortness of breath or wheezing   Gastrointestinal: Negative for nausea, vomiting, abdominal pain, blood in stool or melena   Musculoskeletal:  Negative for back pain    Hematology: Negative for easy bruising or bleeding   Neurological Negative for speech change or focal weakness   Psychiatric Negative for depression, nervous or anxiety     Patient's Medications    No medications on file     Not on File  There is no problem list on file for this patient.    No past medical history on file.  No family history on file.  No past surgical history on file.     Social History     Socioeconomic History    Marital status: Married     Medication, allergies, problem list, past medical, family and social history reviewed in EPIC by Cheryl Flash, MD 01/25/2023.    OBJECTIVE:  BP 118/72 (Site: Arm R, Position: Sitting)   Ht 5\' 8"  (1.727 m)   Wt 133.5 kg (294 lb 6.4 oz)   BMI 44.76 kg/m     Physical Exam     CONSTITUTIONAL:  Healthy, well developed, well nourished male, is alert and oriented x 3 today in no distress.  Morbidly obese.    HENT    Head: Normocephalic   Eyes:  Conjunctiva pink and lids normal, No xanthelasma.   NECK   No subcutaneous emphysema   No JVD    RESPIRATORY   Effort:  Normal  Breath sounds: Normal    CARDIOVASCULAR   Normal PMI, no palpable thrills, lifts or palpable S3 or S4   Ascultation:  RRR   Carotid:  No bruits or decreased carotid pulse   Aorta:  No palpable pulsatile abdominal mass in his obese abdomen     PULSE EXAM       Radial  Femoral Popliteal DP PT    Right  +2  +2  +2  +2 +2    Left  +2  +2  +2  +2 +2     Extremities:  No edema      GASTROINTESTINAL   ABDOMEN:  Soft and no tenderness     MUSCULOSKELETAL   Back: Straight, gait normal, normal muscular tone, strength and ROM.    EXTREMITIES   Digits and nails:  No lesions. No cyanosis or clubbing noted.    SKIN   No ulcers, skin intact, good turgor    NEURO-PSYCH   Oriented:  to time, place and person   Mood:  Calm and relaxed     RESULTS: I have personally reviewed the [facility] aorta from 01/06/2023 which reveals the following:.   Aorta: Duplex ultrasound of the abdominal aorta was performed. This was a  technically difficult study secondary to patient habitus and improper test prep. The maximal diameter of the aorta was 3.4 cm at the suprarenal level. The maximal diameter of the right common iliac artery is 1.5 cm. The maximal diameter of the left common iliac artery is 2.3 cm.   Conclusions: Abdominal aortic aneurysm with a maximum diameter of 3.4 x 3.9 cm.   Left common iliac artery aneurysm with a maximal diameter of 2.3 cm.   When compared to the previous exam performed on 06/04/2021, the proximal segment of the aorta has increased to aneurysmal size and the left iliac artery is increased in diameter and is now aneurysmal.   Technically difficult as described.     This note has been sent to Julio AlmJanna Quiling, the requesting physician, with findings and recommendations via In Basket.      This document has been electronically signed by Cheryl Flashasesh Mahendra Shah, MD on 01/25/2023    Answers submitted by the patient for this visit:  Review of Systems (Submitted on 01/18/2023)  Fever: No  Chills: No  Weight Loss: No  Fatigue/Tired: No  Blurred Vision: No  Double Vision: No  Eye Redness: No  Heartburn: No  Nausea: No  Vomiting: No  Abdominal Pain: No  Blood in stool: No  Diarrhea: No  Constipation: No  Rash: No  Itching: No  Chest Pain: No  Palpitations: No  Sleep propped up in bed or sitting in a chair : No  Pain in leg while walking/exercising: No  Leg Swelling: No  Do you awaken from sleep feeling short of breath?: No  Painful Urination: No  Blood in Urine: No  Dizziness: No  Tingling: No  Tremor: No  Numbness: No  Speech Change: No  Weakness: No  Seizures: No  Passing Out: No  Headache: No  Congestion: No  Sore Throat: No  Ear Pain: No  Runny Nose: No  Cough: No  Cough Up Blood: No  Productive Cough: No  Shortness of Breath: No  Wheezing: No  Muscle Pain : No  Neck Pain: No  Back Pain: No  Joint Tenderness: No  Feeling Down/ Sadness: No  Insomnia: No  Memory Loss: No  Worry: No    Electronically signed by Neta EhlersShah, Rasesh  Betsey HolidayMahendra, MD at 01/25/2023  4:25 PM EST

## 2023-02-28 ENCOUNTER — Inpatient Hospital Stay: Admit: 2023-03-01

## 2024-01-25 ENCOUNTER — Inpatient Hospital Stay: Admit: 2024-01-25

## 2024-02-20 ENCOUNTER — Encounter

## 2024-02-21 ENCOUNTER — Inpatient Hospital Stay: Admit: 2024-02-21

## 2024-02-21 DIAGNOSIS — I10 Essential (primary) hypertension: Secondary | ICD-10-CM

## 2024-02-21 LAB — ECHOCARDIOGRAM COMPLETE 2D W DOPPLER W COLOR: Left Ventricular Ejection Fraction: 58

## 2024-03-28 ENCOUNTER — Inpatient Hospital Stay: Admit: 2024-03-28

## 2024-04-23 ENCOUNTER — Inpatient Hospital Stay
Admit: 2024-04-23 | Discharge: 2024-04-24 | Disposition: A | Discharge status: Home / Self Care | Payer: Medicare (Managed Care) | Attending: Student in an Organized Health Care Education/Training Program

## 2024-04-23 DIAGNOSIS — H1013 Acute atopic conjunctivitis, bilateral: Secondary | ICD-10-CM

## 2024-04-23 DIAGNOSIS — H5789 Other specified disorders of eye and adnexa: Secondary | ICD-10-CM

## 2024-04-23 MED ORDER — DIPHENHYDRAMINE HCL 25 MG PO CAPS
25 | ORAL | Status: AC
Start: 2024-04-23 — End: 2024-04-23
  Administered 2024-04-23: 25 mg via ORAL

## 2024-04-23 MED ORDER — DEXAMETHASONE 4 MG PO TABS
4 | ORAL | Status: AC
Start: 2024-04-23 — End: 2024-04-23
  Administered 2024-04-23: 10 mg via ORAL

## 2024-04-23 MED ORDER — FLUORESCEIN SODIUM 1 MG OP STRP
1 | OPHTHALMIC | Status: AC
Start: 2024-04-23 — End: 2024-04-23
  Administered 2024-04-23: 22:00:00 1 via OPHTHALMIC

## 2024-04-23 MED ORDER — PROPARACAINE HCL 0.5 % OP SOLN
0.5 | Freq: Once | OPHTHALMIC | Status: AC
Start: 2024-04-23 — End: 2024-04-23
  Administered 2024-04-23: 23:00:00 1 [drp] via OPHTHALMIC

## 2024-04-23 MED ORDER — LORATADINE 10 MG PO TABS
10 | ORAL_TABLET | Freq: Every day | ORAL | 0 refills | 30.00000 days | Status: AC
Start: 2024-04-23 — End: 2024-05-23

## 2024-04-23 MED ORDER — DIPHENHYDRAMINE HCL 25 MG PO CAPS
25 | ORAL_CAPSULE | Freq: Four times a day (QID) | ORAL | 0 refills | 5.00000 days | Status: AC | PRN
Start: 2024-04-23 — End: 2024-05-03

## 2024-04-23 MED FILL — DIPHENHYDRAMINE HCL 25 MG PO CAPS: 25 MG | ORAL | Qty: 1 | Fill #0

## 2024-04-23 MED FILL — DEXAMETHASONE 4 MG PO TABS: 4 MG | ORAL | Qty: 3 | Fill #0

## 2024-04-23 MED FILL — FUL-GLO 1 MG OP STRP: 1 MG | OPHTHALMIC | Qty: 1 | Fill #0

## 2024-04-23 MED FILL — PROPARACAINE HCL 0.5 % OP SOLN: 0.5 % | OPHTHALMIC | Qty: 15 | Fill #0

## 2024-04-23 NOTE — ED Notes (Signed)
 Pt discharged by PA     Rice Chamorro, RN  04/23/24 2055

## 2024-04-23 NOTE — ED Triage Notes (Signed)
 Pt arrives ambulatory in triage with c/o left eye pain and itchiness for 2wks

## 2024-04-23 NOTE — ED Provider Notes (Signed)
 [facility]  Emergency Department Treatment Report    Patient: David Dickerson Age: 73 y.o. Sex: male    Date of Birth: 1951/08/17 Admit Date: 04/23/2024 PCP: Lezlie Reddish, APRN - NP   MRN: [id number]  CSN: [id number]  Attending:  Achille Ache, DO    Room: FCWR/FCWR Time Dictated: 7:59 PM APP:  Jorje Newton       Chief Complaint   Chief Complaint   Patient presents with    Eye Pain     History of Present Illness   73 y.o. male presents to the ED with 2 weeks of itchy eyes.  Patient says its gotten to the point where it is hard to open his eyelids.  He says that it started with the right and now is with the left.  He says he sees a retina specialist and last week saw his ophthalmologist with [facility], Dr. Francetta Innocent.  He says Dr. Francetta Innocent told him it was dry eye and patient's been using over-the-counter eyedrops but symptoms seem to have worsened.  Patient says today had an appointment with his primary care physician who put stain in his eyes and told him there was something abnormal in both of the eyes and to come to the ED.  Patient tells me he has had cataract surgery.  He says he wears glasses for up close vision.  He denies any fever.  Says he had a slight stuffy nose.    INDEPENDENT HISTORIAN: History and/or plan development assisted by: None  Review of Systems   See as outlined above in HPI  Past Medical/Surgical History   No past medical history on file.  No past surgical history on file.    Social History     Social History     Socioeconomic History    Marital status: Widowed       Family History   No family history on file.    Home Medications     Previous Medications    No medications on file       Allergies   No Known Allergies    Physical Exam   BP (!) 173/99   Pulse 53   Temp 98.1 F (36.7 C) (Oral)   Resp 16   Ht 1.727 m (5\' 8" )   Wt 122.5 kg (270 lb)   SpO2 99%   BMI 41.05 kg/m    Constitutional: Patient appears well developed and well nourished. Appearance and behavior are age  and situation appropriate.  HEENT: Visual acuity OD 20/50, OS 20/100, OU 20/40.  Conjunctiva clear mildly injected.  Lids and lashes normal.  No pain with consensual light reflex.  Anterior chambers are clear bilaterally.  Left cornea with uptake of fluorescein stain in linear vertical distribution medial aspect in 2 separate areas.  Right cornea with uptake fluorescein stain in linear vertical distribution medial aspect and 2 separate areas. See images below. No Seidel sign in either eye.  PERRLA. Mucous membranes moist, non-erythematous.   Neck: Symmetrical, no masses. Normal range of motion.  Gastrointestinal:  Abdomen soft, nontender without complaint of pain to palpation.  Musculoskeletal: No deformities of the limbs.  Integumentary: Warm and dry without rashes or lesions  Neurologic: Alert and moving all limbs spontaneously.          Impression and Management Plan     RECORDS REVIEWED: I reviewed the patient's previous records here at [facility] and available outside facilities and note that patient has history infrarenal abdominal aortic  aneurysm, aneurysm of iliac artery based on note by vascular surgeon Dr. Mason Sole, 01/31/2024.    EXTERNAL RESULTS REVIEWED: Hemoglobin A1c 11/07/2022 as seen in Care Everywhere was 6.1.    Patient with bilateral eye itching.  Visual acuity is poor but he says he has a hard time keeping his eyes open because they are so itchy.  I see vertical perfectly linear slit like areas in 2 spots medial aspect both corneas, there is no Seidel sign.  I suspect these are leftover from previous cataract surgeries.    I have no notes from Dr. Francetta Innocent to confirm this, will attempt to reach out to his office.  I suspect this is allergic conjunctivitis.  Will treat with Benadryl and steroid here.    Differential diagnoses: Allergic conjunctivitis, dry eye, retained sutures from cataract surgery.  Diagnostic Studies       Imaging and Labs:  No results found for any visits on 04/23/24.       ED Course             Patient received the following medications during stay:  Medications   fluorescein ophthalmic strip 1 mg (1 mg Ophthalmic Given 04/23/24 1829)   proparacaine (ALCAINE) 0.5 % ophthalmic solution 1 drop (1 drop Both Eyes Given 04/23/24 1830)   diphenhydrAMINE (BENADRYL) capsule 25 mg (25 mg Oral Given 04/23/24 1934)   dexAMETHasone (DECADRON) tablet 10 mg (10 mg Oral Given 04/23/24 1934)         Most recent vital signs:  BP (!) 173/99   Pulse 53   Temp 98.1 F (36.7 C) (Oral)   Resp 16   Ht 1.727 m (5\' 8" )   Wt 122.5 kg (270 lb)   SpO2 99%   BMI 41.05 kg/m       Medical Decision Making     NARRATIVE: Patient here with itchy eyes.  I suspect allergic conjunctivitis.  He does have interesting distribution of small areas of uptake that are very small in vertical on the inner aspect of both corneas.  I suspect from previous cataract surgery.  There is no Seidel sign to suggest deep laceration.  There is no eyelashes scratching the cornea.  Patient denies any significant pain just itching.  I have a low suspicion for foreign body.  This does not appear to be infectious.    Discussed with his ophthalmologist Dr. Francetta Innocent, agrees with no sign of acute infection, can be discharged with symptomatic treatment for allergic conjunctivitis, agreed with oral steroids and Benadryl and says patient can see him in the office tomorrow morning.    I discussed this plan with patient who is agreeable.  Will start on Benadryl, Claritin.    Patient instructed to return to the ED for any new or worsening symptoms, otherwise, they are to follow up with his ophthalmologist tomorrow.    Patient will be discharged with the following medication prescriptions:     Medication List        START taking these medications      diphenhydrAMINE 25 MG capsule  Commonly known as: Benadryl Allergy  Take 1 capsule by mouth every 6 hours as needed for Itching or Allergies     loratadine 10 MG tablet  Commonly known as: Claritin  Take 1 tablet by mouth  daily               Where to Get Your Medications        These medications were sent to [facility] [id number] -  [place], [place] - [address] - P [phone] Kaylene Pascal [phone]  [address], [place] [address]      Phone: [phone]   diphenhydrAMINE 25 MG capsule  loratadine 10 MG tablet         Final Diagnosis       ICD-10-CM    1. Eye irritation  H57.89       2. Allergic conjunctivitis of both eyes  H10.13           Disposition   The patient was discharged in stable condition will follow-up as outlined above.      The patient was personally evaluated by myself and discussed with Achille Ache, DO   who agrees with the above assessment and plan.      Lethea Ravel, PA-C  Apr 23, 2024    My signature above authenticates this document and my orders, the final    diagnosis (es), discharge prescription (s), and instructions in the Epic    record.  If you have any questions please contact [phone].     Nursing notes have been reviewed by the physician/ advanced practice    Clinician.         Lethea Ravel, PA-C  04/23/24 2001
